# Patient Record
Sex: Male | Born: 1960 | Race: White | Hispanic: No | Marital: Married | Smoking: Never smoker
Health system: Southern US, Community
[De-identification: ages and names within clinical notes are randomized; demographics above are authoritative.]

## PROBLEM LIST (undated history)

## (undated) DIAGNOSIS — K56609 Unspecified intestinal obstruction, unspecified as to partial versus complete obstruction: Secondary | ICD-10-CM

## (undated) DIAGNOSIS — I255 Ischemic cardiomyopathy: Secondary | ICD-10-CM

## (undated) DIAGNOSIS — B356 Tinea cruris: Secondary | ICD-10-CM

## (undated) DIAGNOSIS — G629 Polyneuropathy, unspecified: Secondary | ICD-10-CM

## (undated) DIAGNOSIS — E7800 Pure hypercholesterolemia, unspecified: Secondary | ICD-10-CM

## (undated) DIAGNOSIS — C801 Malignant (primary) neoplasm, unspecified: Secondary | ICD-10-CM

## (undated) DIAGNOSIS — E559 Vitamin D deficiency, unspecified: Secondary | ICD-10-CM

## (undated) DIAGNOSIS — L219 Seborrheic dermatitis, unspecified: Secondary | ICD-10-CM

## (undated) DIAGNOSIS — D494 Neoplasm of unspecified behavior of bladder: Secondary | ICD-10-CM

## (undated) DIAGNOSIS — K5909 Other constipation: Secondary | ICD-10-CM

## (undated) DIAGNOSIS — L989 Disorder of the skin and subcutaneous tissue, unspecified: Secondary | ICD-10-CM

## (undated) DIAGNOSIS — E119 Type 2 diabetes mellitus without complications: Secondary | ICD-10-CM

## (undated) DIAGNOSIS — R5383 Other fatigue: Secondary | ICD-10-CM

## (undated) DIAGNOSIS — N529 Male erectile dysfunction, unspecified: Secondary | ICD-10-CM

## (undated) DIAGNOSIS — R0602 Shortness of breath: Secondary | ICD-10-CM

## (undated) DIAGNOSIS — I10 Essential (primary) hypertension: Secondary | ICD-10-CM

## (undated) DIAGNOSIS — L570 Actinic keratosis: Secondary | ICD-10-CM

## (undated) DIAGNOSIS — E782 Mixed hyperlipidemia: Secondary | ICD-10-CM

## (undated) DIAGNOSIS — R7989 Other specified abnormal findings of blood chemistry: Secondary | ICD-10-CM

## (undated) DIAGNOSIS — K760 Fatty (change of) liver, not elsewhere classified: Secondary | ICD-10-CM

## (undated) DIAGNOSIS — K219 Gastro-esophageal reflux disease without esophagitis: Secondary | ICD-10-CM

## (undated) DIAGNOSIS — I219 Acute myocardial infarction, unspecified: Secondary | ICD-10-CM

## (undated) DIAGNOSIS — M810 Age-related osteoporosis without current pathological fracture: Secondary | ICD-10-CM

## (undated) DIAGNOSIS — N400 Enlarged prostate without lower urinary tract symptoms: Secondary | ICD-10-CM

## (undated) DIAGNOSIS — Z6821 Body mass index (BMI) 21.0-21.9, adult: Secondary | ICD-10-CM

## (undated) DIAGNOSIS — I2510 Atherosclerotic heart disease of native coronary artery without angina pectoris: Secondary | ICD-10-CM

## (undated) DIAGNOSIS — N189 Chronic kidney disease, unspecified: Secondary | ICD-10-CM

## (undated) DIAGNOSIS — R079 Chest pain, unspecified: Secondary | ICD-10-CM

## (undated) DIAGNOSIS — R599 Enlarged lymph nodes, unspecified: Secondary | ICD-10-CM

## (undated) DIAGNOSIS — G35 Multiple sclerosis: Secondary | ICD-10-CM

## (undated) DIAGNOSIS — H6123 Impacted cerumen, bilateral: Secondary | ICD-10-CM

## (undated) DIAGNOSIS — I739 Peripheral vascular disease, unspecified: Secondary | ICD-10-CM

## (undated) DIAGNOSIS — M545 Low back pain, unspecified: Secondary | ICD-10-CM

## (undated) DIAGNOSIS — A46 Erysipelas: Secondary | ICD-10-CM

## (undated) DIAGNOSIS — J45909 Unspecified asthma, uncomplicated: Secondary | ICD-10-CM

## (undated) DIAGNOSIS — K635 Polyp of colon: Secondary | ICD-10-CM

## (undated) DIAGNOSIS — M48061 Spinal stenosis, lumbar region without neurogenic claudication: Secondary | ICD-10-CM

## (undated) DIAGNOSIS — A4902 Methicillin resistant Staphylococcus aureus infection, unspecified site: Secondary | ICD-10-CM

## (undated) DIAGNOSIS — E7889 Other lipoprotein metabolism disorders: Secondary | ICD-10-CM

## (undated) DIAGNOSIS — G8929 Other chronic pain: Secondary | ICD-10-CM

## (undated) DIAGNOSIS — M5416 Radiculopathy, lumbar region: Secondary | ICD-10-CM

## (undated) DIAGNOSIS — R634 Abnormal weight loss: Secondary | ICD-10-CM

## (undated) DIAGNOSIS — J302 Other seasonal allergic rhinitis: Secondary | ICD-10-CM

## (undated) DIAGNOSIS — B354 Tinea corporis: Secondary | ICD-10-CM

## (undated) HISTORY — DX: Impacted cerumen, bilateral: H61.23

## (undated) HISTORY — DX: Atherosclerotic heart disease of native coronary artery without angina pectoris: I25.10

## (undated) HISTORY — DX: Unspecified intestinal obstruction, unspecified as to partial versus complete obstruction: K56.609

## (undated) HISTORY — DX: Neoplasm of unspecified behavior of bladder: D49.4

## (undated) HISTORY — DX: Chest pain, unspecified: R07.9

## (undated) HISTORY — DX: Polyneuropathy, unspecified: G62.9

## (undated) HISTORY — DX: Other seasonal allergic rhinitis: J30.2

## (undated) HISTORY — DX: Fatty (change of) liver, not elsewhere classified: K76.0

## (undated) HISTORY — DX: Gastro-esophageal reflux disease without esophagitis: K21.9

## (undated) HISTORY — DX: Age-related osteoporosis without current pathological fracture: M81.0

## (undated) HISTORY — PX: MANDIBLE SURGERY: SHX707

## (undated) HISTORY — DX: Enlarged lymph nodes, unspecified: R59.9

## (undated) HISTORY — DX: Body mass index (BMI) 21.0-21.9, adult: Z68.21

## (undated) HISTORY — DX: Benign prostatic hyperplasia without lower urinary tract symptoms: N40.0

## (undated) HISTORY — DX: Erysipelas: A46

## (undated) HISTORY — DX: Peripheral vascular disease, unspecified: I73.9

## (undated) HISTORY — DX: Other constipation: K59.09

## (undated) HISTORY — DX: Ischemic cardiomyopathy: I25.5

## (undated) HISTORY — DX: Disorder of the skin and subcutaneous tissue, unspecified: L98.9

## (undated) HISTORY — DX: Shortness of breath: R06.02

## (undated) HISTORY — DX: Male erectile dysfunction, unspecified: N52.9

## (undated) HISTORY — DX: Low back pain, unspecified: M54.50

## (undated) HISTORY — DX: Other fatigue: R53.83

## (undated) HISTORY — DX: Multiple sclerosis: G35

## (undated) HISTORY — DX: Other chronic pain: G89.29

## (undated) HISTORY — DX: Spinal stenosis, lumbar region without neurogenic claudication: M48.061

## (undated) HISTORY — DX: Polyp of colon: K63.5

## (undated) HISTORY — DX: Other lipoprotein metabolism disorders: E78.89

## (undated) HISTORY — DX: Tinea corporis: B35.4

## (undated) HISTORY — DX: Unspecified asthma, uncomplicated: J45.909

## (undated) HISTORY — DX: Vitamin D deficiency, unspecified: E55.9

## (undated) HISTORY — DX: Hypomagnesemia: E83.42

## (undated) HISTORY — DX: Pure hypercholesterolemia, unspecified: E78.00

## (undated) HISTORY — DX: Mixed hyperlipidemia: E78.2

## (undated) HISTORY — DX: Abnormal weight loss: R63.4

## (undated) HISTORY — DX: Seborrheic dermatitis, unspecified: L21.9

## (undated) HISTORY — DX: Type 2 diabetes mellitus without complications: E11.9

## (undated) HISTORY — DX: Radiculopathy, lumbar region: M54.16

## (undated) HISTORY — DX: Actinic keratosis: L57.0

## (undated) HISTORY — DX: Essential (primary) hypertension: I10

## (undated) HISTORY — DX: Chronic kidney disease, unspecified: N18.9

## (undated) HISTORY — DX: Other specified abnormal findings of blood chemistry: R79.89

## (undated) HISTORY — DX: Tinea cruris: B35.6

---

## 2001-11-23 ENCOUNTER — Inpatient Hospital Stay (HOSPITAL_COMMUNITY): Admission: AD | Admit: 2001-11-23 | Discharge: 2001-12-01 | Payer: Self-pay | Admitting: Cardiovascular Disease

## 2001-11-28 ENCOUNTER — Encounter: Payer: Self-pay | Admitting: Cardiothoracic Surgery

## 2001-11-29 ENCOUNTER — Encounter: Payer: Self-pay | Admitting: Cardiothoracic Surgery

## 2001-11-30 ENCOUNTER — Encounter: Payer: Self-pay | Admitting: Thoracic Surgery (Cardiothoracic Vascular Surgery)

## 2005-03-05 ENCOUNTER — Ambulatory Visit: Payer: Self-pay | Admitting: Cardiovascular Disease

## 2005-03-09 ENCOUNTER — Ambulatory Visit: Payer: Self-pay

## 2012-11-21 ENCOUNTER — Encounter: Payer: Self-pay | Admitting: *Deleted

## 2012-11-21 ENCOUNTER — Ambulatory Visit (INDEPENDENT_AMBULATORY_CARE_PROVIDER_SITE_OTHER): Payer: Managed Care, Other (non HMO) | Admitting: Cardiovascular Disease

## 2012-11-21 VITALS — BP 120/80 | HR 67 | Ht 71.0 in | Wt 148.0 lb

## 2012-11-21 DIAGNOSIS — Z951 Presence of aortocoronary bypass graft: Secondary | ICD-10-CM

## 2012-11-21 DIAGNOSIS — I10 Essential (primary) hypertension: Secondary | ICD-10-CM

## 2012-11-21 DIAGNOSIS — I739 Peripheral vascular disease, unspecified: Secondary | ICD-10-CM

## 2012-11-21 DIAGNOSIS — R079 Chest pain, unspecified: Secondary | ICD-10-CM

## 2012-11-21 DIAGNOSIS — G35 Multiple sclerosis: Secondary | ICD-10-CM

## 2012-11-21 DIAGNOSIS — E782 Mixed hyperlipidemia: Secondary | ICD-10-CM

## 2012-11-21 NOTE — Patient Instructions (Signed)
Your physician wants you to follow-up in: 6 MONTHS WITH DR Haywood Filler will receive a reminder letter in the mail two months in advance. If you don't receive a letter, please call our office to schedule the follow-up appointment.  Your physician recommends that you continue on your current medications as directed. Please refer to the Current Medication list given to you today. Your physician has requested that you have en exercise stress myoview. For further information please visit [email or web address] Please follow instruction sheet, as given. DX CHEST PAIN  Your physician has requested that you have a lower or upper extremity arterial duplex. This test is an ultrasound of the arteries in the legs or arms. It looks at arterial blood flow in the legs and arms. Allow one hour for Lower and Upper Arterial scans. There are no restrictions or special instructions LOWER WITH ABI'S  DX CLAUDICATION

## 2012-11-21 NOTE — Progress Notes (Signed)
Patient ID: Gregory Gardner, male   DOB: Dec 17, 1960, 52 y.o.   MRN: [id number] 52 yo referred by Dr Katrinka Blazing Distant history of CABG 2003.  [name] to LAD, Sequential SVG PDA/PLA, and Sequential to D1/circumflex  Presented with IMI Given TPA in [place].  EF ok.  Poor f/u since.  Beginning of January had two episodes of SSCP, diaphoresis and dyspnea.  Was laying down at time.  No exertional pain but Diaphoresis similar to previous MI.  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  Quit smoking  Has MS and balance is better since stopping betaserone.   Compliant with meds.  Also complains of LLE calf pain with ambulation ? claudication  ROS: Denies fever, malais, weight loss, blurry vision, decreased visual acuity, cough, sputum, SOB, hemoptysis, pleuritic pain, palpitaitons, heartburn, abdominal pain, melena, lower extremity edema, claudication, or rash.  All other systems reviewed and negative   General: Affect appropriate Healthy:  appears stated age HEENT: normal Neck supple with no adenopathy JVP normal no bruits no thyromegaly Lungs clear with no wheezing and good diaphragmatic motion Heart:  S1/S2 no murmur,rub, gallop or click PMI normal Abdomen: benighn, BS positve, no tenderness, no AAA no bruit.  No HSM or HJR Distal pulses intact with no bruits No edema Neuro non-focal Skin warm and dry No muscular weakness  Medications Current Outpatient Prescriptions  Medication Sig Dispense Refill  . aspirin 325 MG tablet Take 325 mg by mouth daily.      . Choline Fenofibrate (FENOFIBRIC ACID) 135 MG CPDR Take 1 tablet by mouth daily.      . Dimethyl Fumarate (TECFIDERA) 240 MG CPDR Take by mouth.      . ezetimibe (ZETIA) 10 MG tablet Take 10 mg by mouth daily.      . metoprolol succinate (TOPROL-XL) 25 MG 24 hr tablet Take 25 mg by mouth daily.      . nitroGLYCERIN (NITROSTAT) 0.4 MG SL tablet Place 0.4 mg under the tongue every 5 (five) minutes as needed.      Marland Kitchen  omeprazole (PRILOSEC) 20 MG capsule Take 20 mg by mouth daily.        Allergies Review of patient's allergies indicates no known allergies.  Family History: No family history on file.  Social History: History   Social History  . Marital Status: Married    Spouse Name: N/A    Number of Children: N/A  . Years of Education: N/A   Occupational History  . Not on file.   Social History Main Topics  . Smoking status: Never Smoker   . Smokeless tobacco: Not on file  . Alcohol Use: Yes  . Drug Use: No  . Sexually Active: Not on file   Other Topics Concern  . Not on file   Social History Narrative  . No narrative on file    Electrocardiogram:NSR rate 67 ? [place]  Assessment and Plan

## 2012-11-21 NOTE — Assessment & Plan Note (Signed)
Cholesterol is at goal.  Continue current dose of statin and diet Rx.  No myalgias or side effects.  F/U  LFT's in 6 months. No results found for this basename: [facility]   Labs with Dr Katrinka Blazing

## 2012-11-21 NOTE — Assessment & Plan Note (Signed)
F/U neuro stable bad reaction to beta serone

## 2012-11-21 NOTE — Assessment & Plan Note (Signed)
Recurrent chest pain 11 years post CABG  F/U stress myovue

## 2012-11-21 NOTE — Assessment & Plan Note (Signed)
Well controlled.  Continue current medications and low sodium Dash type diet.    

## 2012-11-21 NOTE — Assessment & Plan Note (Signed)
Pulses seem ok with no bruit  F/U ABI's

## 2012-11-23 NOTE — Addendum Note (Signed)
Addended by: Freddi Starr on: 11/23/2012 02:18 PM   Modules accepted: Orders

## 2012-11-27 ENCOUNTER — Encounter (INDEPENDENT_AMBULATORY_CARE_PROVIDER_SITE_OTHER): Payer: Managed Care, Other (non HMO)

## 2012-11-27 DIAGNOSIS — I739 Peripheral vascular disease, unspecified: Secondary | ICD-10-CM

## 2012-11-27 DIAGNOSIS — I70219 Atherosclerosis of native arteries of extremities with intermittent claudication, unspecified extremity: Secondary | ICD-10-CM

## 2012-11-28 ENCOUNTER — Encounter: Payer: Self-pay | Admitting: Cardiovascular Disease

## 2012-12-04 ENCOUNTER — Ambulatory Visit (HOSPITAL_COMMUNITY): Payer: Managed Care, Other (non HMO) | Attending: Cardiovascular Disease | Admitting: Radiology

## 2012-12-04 VITALS — BP 108/64 | HR 56 | Ht 71.0 in | Wt 146.0 lb

## 2012-12-04 DIAGNOSIS — Z951 Presence of aortocoronary bypass graft: Secondary | ICD-10-CM

## 2012-12-04 DIAGNOSIS — I2510 Atherosclerotic heart disease of native coronary artery without angina pectoris: Secondary | ICD-10-CM

## 2012-12-04 DIAGNOSIS — I10 Essential (primary) hypertension: Secondary | ICD-10-CM | POA: Insufficient documentation

## 2012-12-04 DIAGNOSIS — R0789 Other chest pain: Secondary | ICD-10-CM | POA: Insufficient documentation

## 2012-12-04 DIAGNOSIS — R079 Chest pain, unspecified: Secondary | ICD-10-CM

## 2012-12-04 DIAGNOSIS — R0602 Shortness of breath: Secondary | ICD-10-CM | POA: Insufficient documentation

## 2012-12-04 MED ORDER — TECHNETIUM TC 99M SESTAMIBI GENERIC - CARDIOLITE
30.0000 | Freq: Once | INTRAVENOUS | Status: AC | PRN
  Administered 2012-12-04: 30 via INTRAVENOUS

## 2012-12-04 MED ORDER — TECHNETIUM TC 99M SESTAMIBI GENERIC - CARDIOLITE
10.0000 | Freq: Once | INTRAVENOUS | Status: AC | PRN
  Administered 2012-12-04: 10 via INTRAVENOUS

## 2012-12-04 NOTE — Progress Notes (Signed)
[facility] SITE 3 NUCLEAR MED [address] [place], [place] [address] [phone]    Cardiology Nuclear Med Study  Gregory Gardner is a 52 y.o. male     MRN : [id number]     DOB: 1961-05-27  Procedure Date: 12/04/2012  Nuclear Med Background Indication for Stress Test:  Evaluation for Ischemia and Graft Patency History:  '03 IWMI>CABG; '06 HQI:ONGEXB, EF=59% Cardiac Risk Factors: Family History - CAD, History of Smoking, Hypertension and Lipids  Symptoms:  Chest Pressure with and without Exertion (last episode of chest discomfort was about a month ago), Diaphoresis and SOB   Nuclear Pre-Procedure Caffeine/Decaff Intake:  None NPO After: 10:00pm   Lungs:  Clear. O2 Sat: 94% on room air. IV 0.9% NS with Angio Cath:  22g  IV Site: R Hand  IV Started by:  Cathlyn Parsons, RN  Chest Size (in):  38 Cup Size: n/a  Height: 5\' 11"  (1.803 m)  Weight:  146 lb (66.225 kg)  BMI:  Body mass index is 20.36 kg/(m^2). Tech Comments:  Toprol held x 24 hours    Nuclear Med Study 1 or 2 day study: 1 day  Stress Test Type:  Stress  Reading MD: Kristeen Miss, MD  Order Authorizing Provider:  Charlton Haws, MD  Resting Radionuclide: Technetium 13m Sestamibi  Resting Radionuclide Dose: 11.0 mCi   Stress Radionuclide:  Technetium 37m Sestamibi  Stress Radionuclide Dose: 32.9 mCi           Stress Protocol Rest HR: 56 Stress HR: 148  Rest BP: 108/64 Stress BP: 150/79  Exercise Time (min): 10:01 METS: 11.0   Predicted Max HR: 169 bpm % Max HR: 87.57 bpm Rate Pressure Product: 28413    Dose of Adenosine (mg):  n/a Dose of Lexiscan: n/a mg  Dose of Atropine (mg): n/a Dose of Dobutamine: n/a mcg/kg/min (at max HR)  Stress Test Technologist: Smiley Houseman, CMA-N  Nuclear Technologist:  Domenic Polite, CNMT     Rest Procedure:  Myocardial perfusion imaging was performed at rest 45 minutes following the intravenous administration of Technetium 41m Sestamibi.  Rest ECG: NSR -  Normal EKG  Stress Procedure:  The patient exercised on the treadmill utilizing the Bruce Protocol for 10:01 minutes. The patient stopped due to fatigue.  He c/o chest tightness, 3/10, at peak exercise; relieved immediately post exercise.  Technetium 10m Sestamibi was injected at peak exercise and myocardial perfusion imaging was performed after a brief delay.  Stress ECG: Insignificant upsloping ST segment depression.  QPS Raw Data Images:  Normal; no motion artifact; normal heart/lung ratio. Stress Images:  Normal homogeneous uptake in all areas of the myocardium. Rest Images:  Normal homogeneous uptake in all areas of the myocardium. Subtraction (SDS):  No evidence of ischemia. Transient Ischemic Dilatation (Normal <1.22):  0.96 Lung/Heart Ratio (Normal <0.45):  0.31  Quantitative Gated Spect Images QGS EDV:  74 ml QGS ESV:  27 ml  Impression Exercise Capacity:  Good exercise capacity. BP Response:  Normal blood pressure response. Clinical Symptoms:  No significant symptoms noted. ECG Impression:  Insignificant upsloping ST segment depression. Comparison with Prior Nuclear Study: No images to compare  Overall Impression:  Normal stress nuclear study.  No evidence of ischemia.  Normal LV function.  LV Ejection Fraction: 63%.  LV Wall Motion:  NL LV Function; NL Wall Motion.    Vesta Mixer, Montez Hageman., MD, [facility] 12/04/2012, 5:09 PM Office - [phone] Pager [phone]

## 2013-10-08 ENCOUNTER — Ambulatory Visit: Payer: Managed Care, Other (non HMO) | Admitting: Cardiovascular Disease

## 2013-11-09 ENCOUNTER — Encounter: Payer: Self-pay | Admitting: *Deleted

## 2013-11-09 ENCOUNTER — Encounter: Payer: Self-pay | Admitting: Cardiovascular Disease

## 2013-11-12 ENCOUNTER — Encounter: Payer: Self-pay | Admitting: Cardiovascular Disease

## 2013-11-12 ENCOUNTER — Ambulatory Visit (INDEPENDENT_AMBULATORY_CARE_PROVIDER_SITE_OTHER): Payer: Managed Care, Other (non HMO) | Admitting: Cardiovascular Disease

## 2013-11-12 VITALS — BP 122/60 | HR 56 | Ht 71.0 in | Wt 157.1 lb

## 2013-11-12 DIAGNOSIS — E782 Mixed hyperlipidemia: Secondary | ICD-10-CM

## 2013-11-12 DIAGNOSIS — Z951 Presence of aortocoronary bypass graft: Secondary | ICD-10-CM

## 2013-11-12 DIAGNOSIS — I10 Essential (primary) hypertension: Secondary | ICD-10-CM

## 2013-11-12 DIAGNOSIS — G35 Multiple sclerosis: Secondary | ICD-10-CM

## 2013-11-12 DIAGNOSIS — I739 Peripheral vascular disease, unspecified: Secondary | ICD-10-CM

## 2013-11-12 DIAGNOSIS — I2510 Atherosclerotic heart disease of native coronary artery without angina pectoris: Secondary | ICD-10-CM

## 2013-11-12 NOTE — Patient Instructions (Signed)
Your physician wants you to follow-up in: YEAR WITH DR NISHAN  You will receive a reminder letter in the mail two months in advance. If you don't receive a letter, please call our office to schedule the follow-up appointment.  Your physician recommends that you continue on your current medications as directed. Please refer to the Current Medication list given to you today. 

## 2013-11-12 NOTE — Assessment & Plan Note (Signed)
Normal ABI's  Pain seems more related to disc issue or MS  He gets lumbar injections periordically with relief  Indicating that pain in LLE is not vascular in nature

## 2013-11-12 NOTE — Assessment & Plan Note (Signed)
Stable f/u neuro  betaserone  No weakness

## 2013-11-12 NOTE — Progress Notes (Signed)
Patient ID: Gregory Gardner, male   DOB: 07/14/1961, 53 y.o.   MRN: [id number] 53 yo referred by Dr Katrinka Blazing Distant history of CABG 2003. [name] to LAD, Sequential SVG PDA/PLA, and Sequential to D1/circumflex Presented with IMI  Given TPA in [place]. EF ok. Poor f/u since. Beginning of January had two episodes of SSCP, diaphoresis and dyspnea. Was laying down at time. No exertional pain but  Diaphoresis similar to previous MI. 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 Quit smoking Has MS and balance is better since stopping betaserone.  Compliant with meds. Also complains of LLE calf pain with ambulation ? 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  All other systems reviewed and negative  General: Affect appropriate Healthy:  appears stated age HEENT: normal Neck supple with no adenopathy JVP normal no bruits no thyromegaly Lungs clear with no wheezing and good diaphragmatic motion Heart:  S1/S2 no murmur, no rub, gallop or click PMI normal Abdomen: benighn, BS positve, no tenderness, no AAA no bruit.  No HSM or HJR Distal pulses intact with no bruits No edema Neuro non-focal Skin warm and dry No muscular weakness   Current Outpatient Prescriptions  Medication Sig Dispense Refill  . Albuterol Sulfate (VENTOLIN HFA IN) Inhale into the lungs.      Marland Kitchen alendronate (FOSAMAX) 70 MG tablet Take 70 mg by mouth once a week. Take with a full glass of water on an empty stomach.      Marland Kitchen aspirin 325 MG tablet Take 325 mg by mouth daily.      . Choline Fenofibrate (FENOFIBRIC ACID) 135 MG CPDR  Take 1 tablet by mouth daily.      . Dimethyl Fumarate (TECFIDERA) 240 MG CPDR Take by mouth.      . ergocalciferol (VITAMIN D2) 50000 UNITS capsule Take 50,000 Units by mouth once a week.      . ezetimibe (ZETIA) 10 MG tablet Take 10 mg by mouth daily.      . metoprolol succinate (TOPROL-XL) 25 MG 24 hr tablet Take 25 mg by mouth daily.      . nitroGLYCERIN (NITROSTAT) 0.4 MG SL tablet Place 0.4 mg under the tongue every 5 (five) minutes as needed.      Marland Kitchen omeprazole (PRILOSEC) 20 MG capsule Take 20 mg by mouth daily.       No current facility-administered medications for this visit.    Allergies  Lodine  Electrocardiogram:  SR rate 56 PR 148  Normal   Assessment and Plan

## 2013-11-12 NOTE — Assessment & Plan Note (Signed)
Stable with no angina and good activity level.  Continue medical Rx  

## 2013-11-12 NOTE — Assessment & Plan Note (Signed)
Well controlled.  Continue current medications and low sodium Dash type diet.    

## 2013-11-12 NOTE — Assessment & Plan Note (Signed)
Cholesterol is at goal.  Continue current dose of statin and diet Rx.  No myalgias or side effects.  F/U  LFT's in 6 months. No results found for this basename: LDLCALC             

## 2013-12-03 ENCOUNTER — Telehealth: Payer: Self-pay | Admitting: *Deleted

## 2013-12-03 ENCOUNTER — Encounter: Payer: Self-pay | Admitting: *Deleted

## 2013-12-03 NOTE — Telephone Encounter (Signed)
LAST OFFICE NOTE  AND CLEARANCE  LETTER  FAXED  .Gregory Gardner

## 2013-12-03 NOTE — Telephone Encounter (Signed)
DR  CABERWAL   RADIOLOGISTS FROM  [facility]  CALLED  PT  NEEDING  PROCEDURE  DID  NOT  UNDERSTAND  WHAT  TYPE   DR  CABERWAL WOULD LIKE   CALL HERE  IS  CELL  NUMBER  [phone].

## 2013-12-03 NOTE — Telephone Encounter (Signed)
Ok to have TURP Talked with Caberwal.  Fax clarence note and my last office visit note to   [phone]

## 2015-01-13 ENCOUNTER — Ambulatory Visit (INDEPENDENT_AMBULATORY_CARE_PROVIDER_SITE_OTHER): Payer: Managed Care, Other (non HMO) | Admitting: Cardiovascular Disease

## 2015-01-13 ENCOUNTER — Encounter: Payer: Self-pay | Admitting: Cardiovascular Disease

## 2015-01-13 DIAGNOSIS — Z0000 Encounter for general adult medical examination without abnormal findings: Secondary | ICD-10-CM

## 2015-01-13 DIAGNOSIS — I10 Essential (primary) hypertension: Secondary | ICD-10-CM

## 2015-01-13 NOTE — Assessment & Plan Note (Signed)
Cholesterol is at goal.  Continue current dose of statin and diet Rx.  No myalgias or side effects.  F/U  LFT's in 6 months. No results found for: LDLCALC Labs with primary           

## 2015-01-13 NOTE — Assessment & Plan Note (Signed)
Stable with no angina and good activity level.  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

## 2015-01-13 NOTE — Assessment & Plan Note (Signed)
Well controlled.  Continue current medications and low sodium Dash type diet.    

## 2015-01-13 NOTE — Progress Notes (Signed)
Patient ID: Gregory Gardner, male   DOB: 05/25/1961, 54 y.o.   MRN: [id number] 54 y.o.  referred by Dr Gregory Gardner Distant history of CABG 2003. Gregory Gardner LIMA to LAD, Sequential SVG PDA/PLA, and Sequential to D1/circumflex Presented with IMI  Given TPA in [place]. EF ok.. 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 Quit smoking Has MS and balance is better since stopping betaserone.  Compliant with meds. Also complains of LLE calf pain with ambulation ? 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  All other systems reviewed and negative  General: Affect appropriate Healthy:  appears stated age HEENT: normal Neck supple with no adenopathy JVP normal no bruits no thyromegaly Lungs clear with no wheezing and good diaphragmatic motion Heart:  S1/S2 no murmur, no rub, gallop or click PMI normal Abdomen: benighn, BS positve, no tenderness, no AAA no bruit.  No HSM or HJR Distal pulses intact with no bruits No edema Neuro non-focal Skin warm and dry No muscular weakness   Current Outpatient Prescriptions  Medication Sig Dispense Refill  . Albuterol Sulfate (VENTOLIN HFA IN) Inhale into the lungs.    Marland Kitchen alendronate (FOSAMAX) 70 MG tablet Take 70 mg by mouth once a week. Take with a full glass of water on an empty stomach.    Marland Kitchen aspirin 325 MG tablet Take 325 mg by mouth daily.    . Choline Fenofibrate (FENOFIBRIC ACID) 135 MG CPDR Take 1 tablet by mouth daily.    . Dimethyl Fumarate (TECFIDERA) 240 MG  CPDR Take by mouth.    . ergocalciferol (VITAMIN D2) 50000 UNITS capsule Take 50,000 Units by mouth once a week.    . ezetimibe (ZETIA) 10 MG tablet Take 10 mg by mouth daily.    . metoprolol succinate (TOPROL-XL) 25 MG 24 hr tablet Take 25 mg by mouth daily.    . nitroGLYCERIN (NITROSTAT) 0.4 MG SL tablet Place 0.4 mg under the tongue every 5 (five) minutes as needed.    Marland Kitchen omeprazole (PRILOSEC) 20 MG capsule Take 20 mg by mouth daily.     No current facility-administered medications for this visit.    Allergies  Lodine  Electrocardiogram:  11/12/13  SR rate 56 PR 148  Normal   01/13/15  SR rate 63 normal no change   Assessment and Plan

## 2015-01-13 NOTE — Patient Instructions (Addendum)
Your physician wants you to follow-up in: YEAR  WITH DR Haywood Filler will receive a reminder letter in the mail two months in advance. If you don't receive a letter, please call our office to schedule the follow-up appointment. Your physician recommends that you continue on your current medications as directed. Please refer to the Current Medication list given to you today.   Your physician has requested that you have an echocardiogram. Echocardiography is a painless test that uses sound waves to create images of your heart. It provides your doctor with information about the size and shape of your heart and how well your heart's chambers and valves are working. This procedure takes approximately one hour. There are no restrictions for this procedure. Monday'S   ARE  BETTER  DAY

## 2015-01-20 ENCOUNTER — Ambulatory Visit (HOSPITAL_COMMUNITY): Payer: Managed Care, Other (non HMO) | Attending: Cardiology

## 2015-01-20 ENCOUNTER — Telehealth: Payer: Self-pay | Admitting: Cardiovascular Disease

## 2015-01-20 DIAGNOSIS — Z0000 Encounter for general adult medical examination without abnormal findings: Secondary | ICD-10-CM | POA: Insufficient documentation

## 2015-01-20 NOTE — Progress Notes (Signed)
2D Echo completed. 01/20/2015 

## 2015-01-20 NOTE — Telephone Encounter (Signed)
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   Pt may be called if there are any questions.

## 2015-01-21 NOTE — Telephone Encounter (Signed)
PT  NOTIFIED  ECHO  AND LAST  OFFICE NOTE  SENT  TO URGENT  CARE   FAX  NUMBER  [phone]

## 2016-02-18 NOTE — Progress Notes (Signed)
Patient ID: BAILOR DARGIN, male   DOB: [birth year], 55 y.o.   MRN: [id number]   55 y.o.  referred by Dr Katrinka Blazing Distant history of CABG 2003. [name] to LAD, Sequential SVG PDA/PLA, and Sequential to D1/circumflex Presented with IMI  Given TPA in [place]. EF ok.. 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 Quit smoking Has MS and balance is better since stopping betaserone.  Compliant with meds. Also complains of LLE calf pain with ambulation ? 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 Systolic function was normal. The estimated ejection fraction was in the range of 55% to 60%. Wall motion was normal; there were no regional wall motion abnormalities. - Aortic valve: Mild thickening and calcification, consistent with sclerosis. - Tricuspid valve: There was mild regurgitation. - Pulmonic valve: There was trivial regurgitation.  MS is stable Cannot take statin due to LFT elevations   ROS: Denies fever, malais, weight loss, blurry vision, decreased visual acuity, cough, sputum, SOB, hemoptysis, pleuritic pain, palpitaitons, heartburn, abdominal pain, melena, lower extremity edema, claudication, or rash.  All other systems reviewed and negative  General: Affect appropriate Healthy:  appears stated age HEENT: normal Neck supple with no adenopathy JVP normal no bruits no thyromegaly Lungs clear with no wheezing and good diaphragmatic motion Heart:  S1/S2 no murmur, no rub, gallop or click PMI normal Abdomen: benighn, BS positve, no tenderness, no AAA no bruit.  No HSM or HJR Distal pulses intact with no bruits No edema Neuro non-focal Skin warm and dry No muscular weakness   Current Outpatient  Prescriptions  Medication Sig Dispense Refill  . Albuterol Sulfate (VENTOLIN HFA IN) Inhale 1 puff into the lungs as needed (for wheezing).     Marland Kitchen alendronate (FOSAMAX) 70 MG tablet Take 70 mg by mouth once a week. Take with a full glass of water on an empty stomach.    Marland Kitchen aspirin 81 MG tablet Take 81 mg by mouth daily.    . cholecalciferol (VITAMIN D) 1000 UNITS tablet Take 1,000 Units by mouth daily.    . Choline Fenofibrate (FENOFIBRIC ACID) 135 MG CPDR Take 1 tablet by mouth daily.    . Dimethyl Fumarate (TECFIDERA) 240 MG CPDR Take 120 mg by mouth daily.     Marland Kitchen ezetimibe (ZETIA) 10 MG tablet Take 10 mg by mouth daily.    . metoprolol succinate (TOPROL-XL) 25 MG 24 hr tablet Take 25 mg by mouth daily.    . nitroGLYCERIN (NITROSTAT) 0.4 MG SL tablet Place 0.4 mg under the tongue every 5 (five) minutes as needed.    Marland Kitchen omeprazole (PRILOSEC) 40 MG capsule Take 40 mg by mouth daily.     No current facility-administered medications for this visit.    Allergies  Lodine  Electrocardiogram:  11/12/13  SR rate 56 PR 148  Normal   01/13/15  SR rate 63 normal no change  02/19/16 SR rate 69 normal   Assessment and Plan CAD/CABG:  Distant 2003 normal myovue 2014 normal EF by echo 2016 Chol:  On zetia and fibrates followed by primary  Intolerant to statins due to liver abnormalities  GERD:  On prilosec low carb diet  MS stable manifests as imbalance continue tecfidera f/u neuro   Charlton Haws

## 2016-02-19 ENCOUNTER — Encounter: Payer: Self-pay | Admitting: Cardiovascular Disease

## 2016-02-19 ENCOUNTER — Ambulatory Visit (INDEPENDENT_AMBULATORY_CARE_PROVIDER_SITE_OTHER): Payer: Managed Care, Other (non HMO) | Admitting: Cardiovascular Disease

## 2016-02-19 VITALS — BP 126/78 | HR 70 | Ht 71.0 in | Wt 168.0 lb

## 2016-02-19 DIAGNOSIS — Z951 Presence of aortocoronary bypass graft: Secondary | ICD-10-CM | POA: Diagnosis not present

## 2016-02-19 DIAGNOSIS — I10 Essential (primary) hypertension: Secondary | ICD-10-CM

## 2016-02-19 NOTE — Addendum Note (Signed)
Addended by: Arcola Jansky on: 02/19/2016 11:18 AM   Modules accepted: Medications

## 2016-02-19 NOTE — Patient Instructions (Signed)

## 2018-10-16 NOTE — Progress Notes (Signed)
Patient ID: Gregory Gardner, male   DOB: [birth year], 57 y.o.   MRN: [id number]     57 y.o.  distant history of CABG 2003. 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 Quit smoking Has MS and balance is better since stopping betaserone.  Compliant with meds.   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  All other systems reviewed and negative  General: BP 124/82   Pulse (!) 51   Ht 5\' 11"  (1.803 m)   Wt 166 lb 12.8 oz (75.7 kg)   SpO2 100%   BMI 23.26 kg/m  Affect appropriate Healthy:  appears stated age HEENT: normal Neck supple with no adenopathy JVP normal no bruits no thyromegaly Lungs clear with no wheezing and good diaphragmatic motion Heart:  S1/S2 no murmur, no rub, gallop or click PMI normal Abdomen: benighn, BS positve, no tenderness, no AAA no bruit.  No HSM or HJR Distal pulses intact with no bruits No edema Neuro non-focal Skin warm and dry No muscular weakness    Current Outpatient Medications  Medication Sig Dispense Refill  . Albuterol Sulfate (VENTOLIN HFA IN) Inhale 1 puff into the lungs as needed (for wheezing).     Marland Kitchen alendronate (FOSAMAX) 70 MG tablet Take 70 mg by mouth once a week. Take with a full glass of water on an empty stomach.    Marland Kitchen aspirin 81 MG tablet Take 81 mg by mouth daily.    . cholecalciferol (VITAMIN D) 1000 UNITS tablet Take 1,000 Units by mouth daily.    . Choline Fenofibrate (FENOFIBRIC ACID) 135 MG CPDR Take 1 tablet by mouth daily.      Marland Kitchen ezetimibe (ZETIA) 10 MG tablet Take 10 mg by mouth daily.    . metoprolol succinate (TOPROL-XL) 25 MG 24 hr tablet Take 25 mg by mouth daily.    . nitroGLYCERIN (NITROSTAT) 0.4 MG SL tablet Place 0.4 mg under the tongue every 5 (five) minutes as needed for chest pain.     Marland Kitchen ocrelizumab 300 mg in sodium chloride 0.9 % 250 mL Inject 300 mg into the vein every 6 (six) months.    Marland Kitchen omeprazole (PRILOSEC) 40 MG capsule Take 40 mg by mouth daily.     No current facility-administered medications for this visit.     Allergies  Lodine [etodolac]  Electrocardiogram:  10/18/18  SR rate 51 normal   Assessment and Plan  CAD/CABG:  Distant 2003 normal myovue 2014 normal EF by echo 2016 f/u exercise Stress myovue for DOT  Chol:  On zetia and fibrates followed by primary  Intolerant to statins due to liver abnormalities   GERD:  On prilosec low carb diet   MS stable manifests as imbalance continue tecfidera f/u neuro   Charlton Haws

## 2018-10-18 ENCOUNTER — Encounter (INDEPENDENT_AMBULATORY_CARE_PROVIDER_SITE_OTHER): Payer: Self-pay

## 2018-10-18 ENCOUNTER — Encounter: Payer: Self-pay | Admitting: Cardiovascular Disease

## 2018-10-18 ENCOUNTER — Ambulatory Visit: Payer: Managed Care, Other (non HMO) | Admitting: Cardiovascular Disease

## 2018-10-18 VITALS — BP 124/82 | HR 51 | Ht 71.0 in | Wt 166.8 lb

## 2018-10-18 DIAGNOSIS — I252 Old myocardial infarction: Secondary | ICD-10-CM

## 2018-10-18 DIAGNOSIS — I2510 Atherosclerotic heart disease of native coronary artery without angina pectoris: Secondary | ICD-10-CM | POA: Diagnosis not present

## 2018-10-18 NOTE — Addendum Note (Signed)
Addended by: Virl Axe, Nicholai Willette L on: 10/18/2018 09:36 AM   Modules accepted: Orders

## 2018-10-18 NOTE — Patient Instructions (Signed)
Medication Instructions:   If you need a refill on your cardiac medications before your next appointment, please call your pharmacy.   Lab work:  If you have labs (blood work) drawn today and your tests are completely normal, you will receive your results only by: Marland Kitchen MyChart Message (if you have MyChart) OR . A paper copy in the mail If you have any lab test that is abnormal or we need to change your treatment, we will call you to review the results.  Testing/Procedures: Your physician has requested that you have en exercise stress myoview. For further information please visit [email or web address] Please follow instruction sheet, as given.  Follow-Up: At [facility] you and your health needs are our priority.  As part of our continuing mission to provide you with exceptional heart care, we have created designated Provider Care Teams.  These Care Teams include your primary Cardiologist (physician) and Advanced Practice Providers (APPs -  Physician Assistants and Nurse Practitioners) who all work together to provide you with the care you need, when you need it. You will need a follow up appointment in 1 years.  Please call our office 2 months in advance to schedule this appointment.  You may see Charlton Haws, MD or one of the following Advanced Practice Providers on your designated Care Team:   Norma Fredrickson, NP Nada Boozer, NP . Georgie Chard, NP

## 2018-11-06 ENCOUNTER — Telehealth (HOSPITAL_COMMUNITY): Payer: Self-pay

## 2018-11-06 NOTE — Telephone Encounter (Signed)
Detailed message left for the patient. Asked to call back with any questions. S.Itamar Mcgowan EMTP 

## 2018-11-09 ENCOUNTER — Ambulatory Visit (HOSPITAL_COMMUNITY): Payer: Managed Care, Other (non HMO) | Attending: Cardiovascular Disease

## 2018-11-09 DIAGNOSIS — I2510 Atherosclerotic heart disease of native coronary artery without angina pectoris: Secondary | ICD-10-CM | POA: Insufficient documentation

## 2018-11-09 DIAGNOSIS — I252 Old myocardial infarction: Secondary | ICD-10-CM | POA: Diagnosis not present

## 2018-11-09 LAB — MYOCARDIAL PERFUSION IMAGING
CHL CUP NUCLEAR SDS: 4
Estimated workload: 8.9 METS
Exercise duration (min): 7 min
Exercise duration (sec): 15 s
LV dias vol: 54 mL (ref 62–150)
LV sys vol: 15 mL
MPHR: 163 {beats}/min
Peak HR: 146 {beats}/min
Percent HR: 89 %
Rest HR: 74 {beats}/min
SRS: 0
SSS: 4
TID: 0.84

## 2018-11-09 MED ORDER — TECHNETIUM TC 99M TETROFOSMIN IV KIT
10.7000 | PACK | Freq: Once | INTRAVENOUS | Status: AC | PRN
  Administered 2018-11-09: 10.7 via INTRAVENOUS
  Filled 2018-11-09: qty 11

## 2018-11-09 MED ORDER — TECHNETIUM TC 99M TETROFOSMIN IV KIT
32.8000 | PACK | Freq: Once | INTRAVENOUS | Status: AC | PRN
  Administered 2018-11-09: 32.8 via INTRAVENOUS
  Filled 2018-11-09: qty 33

## 2020-01-07 NOTE — Progress Notes (Signed)
Virtual Visit via Telephone Note   This visit type was conducted due to national recommendations for restrictions regarding the COVID-19 Pandemic (e.g. social distancing) in an effort to limit this patient's exposure and mitigate transmission in our community.  Due to his co-morbid illnesses, this patient is at least at moderate risk for complications without adequate follow up.  This format is felt to be most appropriate for this patient at this time.  The patient did not have access to video technology/had technical difficulties with video requiring transitioning to audio format only (telephone).  All issues noted in this document were discussed and addressed.  No physical exam could be performed with this format.  Please refer to the patient's chart for his  consent to telehealth for [facility].   The patient was identified using 2 identifiers.  Date:  01/08/2020   ID:  Gregory Gardner, DOB 07-Apr-1961, MRN [id number]  Patient Location: Other:  work Provider Location: Office  PCP:  Caffie Damme, MD  Cardiologist:  Charlton Haws, MD  Electrophysiologist:  None   Evaluation Performed:  Follow-Up Visit  Chief Complaint:  CAD  History of Present Illness:    Gregory Gardner is a 59 y.o. male with distant history of CABG 2003. 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 Quit smoking Has MS and balance is better since stopping betaserone.  Compliant with meds.   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   Today 01/08/20 pt has no complaints of chest pain or SOB.  He has not have COVID in the last year.  He does not believe he  will obtain Vaccine, he becomes ill with flu vaccine.  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  Plan for stress test next year.   He does not exercise.  Lipids were just evaluated by PCP will attempt to obtain copy of these.  He was unable to take BP but on recent appt his BP was good.   He does try to eat healthy.      The patient does not have symptoms concerning for COVID-19 infection (fever, chills, cough, or new shortness of breath).    Past Medical History:  Diagnosis Date  . Chest pain   . Claudication (HCC)   . Mixed hyperlipidemia   . Multiple sclerosis (HCC)    Past Surgical History:  Procedure Laterality Date  . MANDIBLE SURGERY       Current Meds  Medication Sig  . Albuterol Sulfate (VENTOLIN HFA IN) Inhale 1 puff into the lungs as needed (for wheezing).   Marland Kitchen alendronate (FOSAMAX) 70 MG tablet Take 70 mg by mouth once a week. Take with a full glass of water on an empty stomach.  . cholecalciferol (VITAMIN D) 1000 UNITS tablet Take 1,000 Units by mouth daily.  . Choline Fenofibrate (FENOFIBRIC ACID) 135 MG CPDR Take 1 tablet by mouth daily.  . DULoxetine (CYMBALTA) 30 MG capsule Take 1 capsule by mouth daily.  Marland Kitchen ezetimibe (ZETIA) 10 MG tablet Take 10 mg by mouth daily.  . metoprolol succinate (TOPROL-XL) 25 MG 24 hr tablet Take  25 mg by mouth daily.  . nitroGLYCERIN (NITROSTAT) 0.4 MG SL tablet Place 0.4 mg under the tongue every 5 (five) minutes as needed for chest pain.   Marland Kitchen ocrelizumab 300 mg in sodium chloride 0.9 % 250 mL Inject 300 mg into the vein every 6 (six) months.  Marland Kitchen omeprazole (PRILOSEC) 40 MG capsule Take 40 mg by mouth daily.     Allergies:   Lodine [etodolac]   Social History   Tobacco Use  . Smoking status: Never Smoker  . Smokeless tobacco: Never Used  Substance Use Topics  . Alcohol use: Yes  . Drug use: No     Family Hx: The patient's family history includes Heart Problems in his father.  All of father's family died with heart  disease  ROS:   Please see the history of present illness.    General:no colds or fevers, no weight changes Skin:no rashes or ulcers HEENT:no blurred vision, no congestion CV:see HPI PUL:see HPI GI:no diarrhea constipation or melena, no indigestion GU:no hematuria, no dysuria MS:no joint pain, no claudication Neuro:no syncope, no lightheadedness Endo:no diabetes, no thyroid disease  All other systems reviewed and are negative.   Prior CV studies:   The following studies were reviewed today:  11/09/18 nuc study  Study Highlights   Nuclear stress EF: 72%. The left ventricular ejection fraction is hyperdynamic (>65%).  There was no ST segment deviation noted during stress.  The patient walked for 7 minutes and 15 seconds. He achieved a peak heart rate of 146 which is 89% predicted maximal heart rate.  The patient developed very mild ST segment depression in the anterior lateral leads which resolved very quickly during the recovery phase.. The blood pressure response to exercise was normal.  The study is normal.  This is a low risk study.   Echo 01/20/15 Study Conclusions   - Left ventricle: The cavity size was normal. Systolic function was  normal. The estimated ejection fraction was in the range of 55%  to 60%. Wall motion was normal; there were no regional wall  motion abnormalities.  - Aortic valve: Mild thickening and calcification, consistent with  sclerosis.  - Tricuspid valve: There was mild regurgitation.  - Pulmonic valve: There was trivial regurgitation.     Labs/Other Tests and Data Reviewed:    EKG:  An ECG dated 10/18/18 was personally reviewed today and demonstrated:  SB at 51 otherwise normal EKG  Recent Labs: No results found for requested labs within last 8760 hours.   Recent Lipid Panel No results found for: CHOL, TRIG, HDL, CHOLHDL, LDLCALC, LDLDIRECT  Wt Readings from Last 3 Encounters:  11/09/18 166 lb (75.3 kg)  10/18/18 166 lb  12.8 oz (75.7 kg)  02/19/16 168 lb (76.2 kg)     Objective:    Vital Signs:  Ht 5\' 11"  (1.803 m)   BMI 23.15 kg/m    VITAL SIGNS:  reviewed  Pt unable to take BP, with PCP it was normal General NAD Pulmonary can speak in complete sentences without SOB Neuro A&O X 3 answers questions approp.  ASSESSMENT & PLAN:    1. CAD with CABG in 2003.  All nuc studies have been normal since that time.   He will need repat nuc in 1 year unless DOT requires earlier.   Will have him see Dr. 2004 in 1 year.   2. HLD  Will obtain form PCP, recently done- intolerant to statins.  3. MS stable followed by Neuro   COVID-19  Education: The signs and symptoms of COVID-19 were discussed with the patient and how to seek care for testing (follow up with PCP or arrange E-visit).  The importance of social distancing was discussed today.  Time:   Today, I have spent 8 minutes with the patient with telehealth technology discussing the above problems.     Medication Adjustments/Labs and Tests Ordered: Current medicines are reviewed at length with the patient today.  Concerns regarding medicines are outlined above.   Tests Ordered: No orders of the defined types were placed in this encounter.   Medication Changes: No orders of the defined types were placed in this encounter.   Follow Up:  In Person in 1 year(s)  Signed, Cecilie Kicks, NP  01/08/2020 8:35 AM    [place]

## 2020-01-08 ENCOUNTER — Encounter: Payer: Self-pay | Admitting: Cardiology

## 2020-01-08 ENCOUNTER — Telehealth (INDEPENDENT_AMBULATORY_CARE_PROVIDER_SITE_OTHER): Payer: Managed Care, Other (non HMO) | Admitting: Cardiology

## 2020-01-08 ENCOUNTER — Other Ambulatory Visit: Payer: Self-pay

## 2020-01-08 VITALS — Ht 71.0 in

## 2020-01-08 DIAGNOSIS — I2510 Atherosclerotic heart disease of native coronary artery without angina pectoris: Secondary | ICD-10-CM

## 2020-01-08 DIAGNOSIS — Z951 Presence of aortocoronary bypass graft: Secondary | ICD-10-CM

## 2020-01-08 DIAGNOSIS — E782 Mixed hyperlipidemia: Secondary | ICD-10-CM

## 2020-01-08 NOTE — Patient Instructions (Signed)
Medication Instructions:  Your physician recommends that you continue on your current medications as directed. Please refer to the Current Medication list given to you today.  *If you need a refill on your cardiac medications before your next appointment, please call your pharmacy*   Lab Work: None ordered   If you have labs (blood work) drawn today and your tests are completely normal, you will receive your results only by: Marland Kitchen MyChart Message (if you have MyChart) OR . A paper copy in the mail If you have any lab test that is abnormal or we need to change your treatment, we will call you to review the results.   Testing/Procedures: None    Follow-Up: At [facility], you and your health needs are our priority.  As part of our continuing mission to provide you with exceptional heart care, we have created designated Provider Care Teams.  These Care Teams include your primary Cardiologist (physician) and Advanced Practice Providers (APPs -  Physician Assistants and Nurse Practitioners) who all work together to provide you with the care you need, when you need it.  We recommend signing up for the patient portal called "MyChart".  Sign up information is provided on this After Visit Summary.  MyChart is used to connect with patients for Virtual Visits (Telemedicine).  Patients are able to view lab/test results, encounter notes, upcoming appointments, etc.  Non-urgent messages can be sent to your provider as well.   To learn more about what you can do with MyChart, go to [email or web address].    Your next appointment:   12 month(s)  The format for your next appointment:   In Person  Provider:   You may see Charlton Haws, MD or one of the following Advanced Practice Providers on your designated Care Team:    Norma Fredrickson, NP  Nada Boozer, NP  Georgie Chard, NP    Other Instructions None

## 2020-05-21 ENCOUNTER — Other Ambulatory Visit: Payer: Self-pay | Admitting: Physician Assistant

## 2020-05-21 DIAGNOSIS — M5432 Sciatica, left side: Secondary | ICD-10-CM

## 2020-05-21 DIAGNOSIS — Z77018 Contact with and (suspected) exposure to other hazardous metals: Secondary | ICD-10-CM

## 2020-06-16 ENCOUNTER — Encounter (HOSPITAL_COMMUNITY)
Admission: EM | Disposition: A | Discharge status: Home / Self Care | Payer: Managed Care, Other (non HMO) | Source: Other Acute Inpatient Hospital | Attending: Cardiothoracic Surgery

## 2020-06-16 ENCOUNTER — Inpatient Hospital Stay (HOSPITAL_COMMUNITY)
Admission: EM | Admit: 2020-06-16 | Discharge: 2020-06-24 | DRG: 234 | Disposition: A | Discharge status: Home / Self Care | Payer: Managed Care, Other (non HMO) | Source: Other Acute Inpatient Hospital | Attending: Cardiothoracic Surgery | Admitting: Cardiothoracic Surgery

## 2020-06-16 DIAGNOSIS — Z9689 Presence of other specified functional implants: Secondary | ICD-10-CM

## 2020-06-16 DIAGNOSIS — Z20822 Contact with and (suspected) exposure to covid-19: Secondary | ICD-10-CM | POA: Diagnosis present

## 2020-06-16 DIAGNOSIS — Z09 Encounter for follow-up examination after completed treatment for conditions other than malignant neoplasm: Secondary | ICD-10-CM

## 2020-06-16 DIAGNOSIS — I214 Non-ST elevation (NSTEMI) myocardial infarction: Secondary | ICD-10-CM | POA: Diagnosis present

## 2020-06-16 DIAGNOSIS — Z7983 Long term (current) use of bisphosphonates: Secondary | ICD-10-CM

## 2020-06-16 DIAGNOSIS — D696 Thrombocytopenia, unspecified: Secondary | ICD-10-CM | POA: Diagnosis not present

## 2020-06-16 DIAGNOSIS — E1151 Type 2 diabetes mellitus with diabetic peripheral angiopathy without gangrene: Secondary | ICD-10-CM | POA: Diagnosis present

## 2020-06-16 DIAGNOSIS — Z888 Allergy status to other drugs, medicaments and biological substances status: Secondary | ICD-10-CM

## 2020-06-16 DIAGNOSIS — D62 Acute posthemorrhagic anemia: Secondary | ICD-10-CM | POA: Diagnosis not present

## 2020-06-16 DIAGNOSIS — G35 Multiple sclerosis: Secondary | ICD-10-CM | POA: Diagnosis present

## 2020-06-16 DIAGNOSIS — I2510 Atherosclerotic heart disease of native coronary artery without angina pectoris: Secondary | ICD-10-CM

## 2020-06-16 DIAGNOSIS — Z79899 Other long term (current) drug therapy: Secondary | ICD-10-CM | POA: Diagnosis not present

## 2020-06-16 DIAGNOSIS — E1165 Type 2 diabetes mellitus with hyperglycemia: Secondary | ICD-10-CM | POA: Diagnosis not present

## 2020-06-16 DIAGNOSIS — E782 Mixed hyperlipidemia: Secondary | ICD-10-CM | POA: Diagnosis present

## 2020-06-16 DIAGNOSIS — K760 Fatty (change of) liver, not elsewhere classified: Secondary | ICD-10-CM | POA: Diagnosis present

## 2020-06-16 DIAGNOSIS — E876 Hypokalemia: Secondary | ICD-10-CM | POA: Diagnosis not present

## 2020-06-16 DIAGNOSIS — I249 Acute ischemic heart disease, unspecified: Secondary | ICD-10-CM | POA: Diagnosis not present

## 2020-06-16 DIAGNOSIS — I081 Rheumatic disorders of both mitral and tricuspid valves: Secondary | ICD-10-CM | POA: Diagnosis present

## 2020-06-16 DIAGNOSIS — I10 Essential (primary) hypertension: Secondary | ICD-10-CM | POA: Diagnosis present

## 2020-06-16 DIAGNOSIS — Z7982 Long term (current) use of aspirin: Secondary | ICD-10-CM

## 2020-06-16 DIAGNOSIS — J9811 Atelectasis: Secondary | ICD-10-CM | POA: Diagnosis not present

## 2020-06-16 DIAGNOSIS — R609 Edema, unspecified: Secondary | ICD-10-CM | POA: Diagnosis not present

## 2020-06-16 DIAGNOSIS — J45909 Unspecified asthma, uncomplicated: Secondary | ICD-10-CM | POA: Diagnosis present

## 2020-06-16 DIAGNOSIS — I25719 Atherosclerosis of autologous vein coronary artery bypass graft(s) with unspecified angina pectoris: Secondary | ICD-10-CM | POA: Diagnosis present

## 2020-06-16 DIAGNOSIS — G2581 Restless legs syndrome: Secondary | ICD-10-CM | POA: Diagnosis present

## 2020-06-16 DIAGNOSIS — Z8249 Family history of ischemic heart disease and other diseases of the circulatory system: Secondary | ICD-10-CM | POA: Diagnosis not present

## 2020-06-16 DIAGNOSIS — I252 Old myocardial infarction: Secondary | ICD-10-CM | POA: Diagnosis not present

## 2020-06-16 DIAGNOSIS — Z87891 Personal history of nicotine dependence: Secondary | ICD-10-CM | POA: Diagnosis not present

## 2020-06-16 DIAGNOSIS — I25110 Atherosclerotic heart disease of native coronary artery with unstable angina pectoris: Secondary | ICD-10-CM | POA: Diagnosis not present

## 2020-06-16 DIAGNOSIS — I739 Peripheral vascular disease, unspecified: Secondary | ICD-10-CM | POA: Diagnosis present

## 2020-06-16 DIAGNOSIS — Z951 Presence of aortocoronary bypass graft: Secondary | ICD-10-CM

## 2020-06-16 DIAGNOSIS — Z4682 Encounter for fitting and adjustment of non-vascular catheter: Secondary | ICD-10-CM

## 2020-06-16 DIAGNOSIS — I25119 Atherosclerotic heart disease of native coronary artery with unspecified angina pectoris: Secondary | ICD-10-CM | POA: Diagnosis present

## 2020-06-16 DIAGNOSIS — E785 Hyperlipidemia, unspecified: Secondary | ICD-10-CM | POA: Diagnosis not present

## 2020-06-16 DIAGNOSIS — Z01810 Encounter for preprocedural cardiovascular examination: Secondary | ICD-10-CM | POA: Diagnosis not present

## 2020-06-16 DIAGNOSIS — Z01811 Encounter for preprocedural respiratory examination: Secondary | ICD-10-CM

## 2020-06-16 HISTORY — DX: Methicillin resistant Staphylococcus aureus infection, unspecified site: A49.02

## 2020-06-16 HISTORY — PX: LEFT HEART CATH AND CORS/GRAFTS ANGIOGRAPHY: CATH118250

## 2020-06-16 LAB — POCT ACTIVATED CLOTTING TIME: Activated Clotting Time: 131 seconds

## 2020-06-16 SURGERY — LEFT HEART CATH AND CORS/GRAFTS ANGIOGRAPHY
Anesthesia: LOCAL

## 2020-06-16 MED ORDER — HEPARIN (PORCINE) IN NACL 1000-0.9 UT/500ML-% IV SOLN
INTRAVENOUS | Status: AC
  Filled 2020-06-16: qty 1000

## 2020-06-16 MED ORDER — FENOFIBRIC ACID 135 MG PO CPDR: 1.0000 | DELAYED_RELEASE_CAPSULE | Freq: Every day | ORAL | Status: DC

## 2020-06-16 MED ORDER — FENTANYL CITRATE (PF) 100 MCG/2ML IJ SOLN
INTRAMUSCULAR | Status: DC | PRN
  Administered 2020-06-16 (×2): 25 ug via INTRAVENOUS

## 2020-06-16 MED ORDER — ONDANSETRON HCL 4 MG/2ML IJ SOLN
4.0000 mg | Freq: Four times a day (QID) | INTRAMUSCULAR | Status: DC | PRN
  Administered 2020-06-16: 4 mg via INTRAVENOUS

## 2020-06-16 MED ORDER — LIDOCAINE HCL (PF) 1 % IJ SOLN
INTRAMUSCULAR | Status: AC
  Filled 2020-06-16: qty 30

## 2020-06-16 MED ORDER — FENTANYL CITRATE (PF) 100 MCG/2ML IJ SOLN
INTRAMUSCULAR | Status: AC
  Filled 2020-06-16: qty 2

## 2020-06-16 MED ORDER — ACETAMINOPHEN 325 MG PO TABS
650.0000 mg | ORAL_TABLET | ORAL | Status: DC | PRN
  Administered 2020-06-17 (×3): 650 mg via ORAL
  Filled 2020-06-16 (×3): qty 2

## 2020-06-16 MED ORDER — EZETIMIBE 10 MG PO TABS
10.0000 mg | ORAL_TABLET | Freq: Every day | ORAL | Status: DC
  Administered 2020-06-17: 10 mg via ORAL
  Filled 2020-06-16: qty 1

## 2020-06-16 MED ORDER — HYDRALAZINE HCL 20 MG/ML IJ SOLN: 10.0000 mg | INTRAMUSCULAR | Status: AC | PRN

## 2020-06-16 MED ORDER — HEPARIN (PORCINE) 25000 UT/250ML-% IV SOLN
1250.0000 [IU]/h | INTRAVENOUS | Status: DC
  Administered 2020-06-17: 950 [IU]/h via INTRAVENOUS
  Filled 2020-06-16: qty 250

## 2020-06-16 MED ORDER — LIDOCAINE HCL (PF) 1 % IJ SOLN
INTRAMUSCULAR | Status: DC | PRN
  Administered 2020-06-16: 10 mL

## 2020-06-16 MED ORDER — ALBUTEROL SULFATE (2.5 MG/3ML) 0.083% IN NEBU: 2.5000 mg | INHALATION_SOLUTION | Freq: Four times a day (QID) | RESPIRATORY_TRACT | Status: DC | PRN

## 2020-06-16 MED ORDER — HEPARIN (PORCINE) IN NACL 1000-0.9 UT/500ML-% IV SOLN
INTRAVENOUS | Status: DC | PRN
  Administered 2020-06-16 (×2): 500 mL

## 2020-06-16 MED ORDER — LABETALOL HCL 5 MG/ML IV SOLN: 10.0000 mg | INTRAVENOUS | Status: AC | PRN

## 2020-06-16 MED ORDER — SODIUM CHLORIDE 0.9 % WEIGHT BASED INFUSION: 1.0000 mL/kg/h | INTRAVENOUS | Status: DC

## 2020-06-16 MED ORDER — SODIUM CHLORIDE 0.9 % IV SOLN
250.0000 mL | INTRAVENOUS | Status: DC | PRN
Start: 2020-06-16 — End: 2020-06-16

## 2020-06-16 MED ORDER — SODIUM CHLORIDE 0.9 % IV SOLN: 250.0000 mL | INTRAVENOUS | Status: DC | PRN

## 2020-06-16 MED ORDER — IOHEXOL 350 MG/ML SOLN
INTRAVENOUS | Status: DC | PRN
  Administered 2020-06-16: 110 mL

## 2020-06-16 MED ORDER — NITROGLYCERIN 0.4 MG SL SUBL: 0.4000 mg | SUBLINGUAL_TABLET | SUBLINGUAL | Status: DC | PRN

## 2020-06-16 MED ORDER — ATORVASTATIN CALCIUM 80 MG PO TABS
80.0000 mg | ORAL_TABLET | Freq: Every evening | ORAL | Status: DC
  Administered 2020-06-17: 80 mg via ORAL
  Filled 2020-06-16: qty 1

## 2020-06-16 MED ORDER — SODIUM CHLORIDE 0.9% FLUSH
3.0000 mL | Freq: Two times a day (BID) | INTRAVENOUS | Status: DC
Start: 2020-06-16 — End: 2020-06-16

## 2020-06-16 MED ORDER — NITROGLYCERIN IN D5W 200-5 MCG/ML-% IV SOLN
30.0000 ug/min | INTRAVENOUS | Status: DC
  Administered 2020-06-16 – 2020-06-18 (×4): 100 ug/min via INTRAVENOUS
  Filled 2020-06-16 (×4): qty 250

## 2020-06-16 MED ORDER — ASPIRIN 81 MG PO CHEW
81.0000 mg | CHEWABLE_TABLET | ORAL | Status: DC
  Filled 2020-06-16: qty 1

## 2020-06-16 MED ORDER — SODIUM CHLORIDE 0.9% FLUSH
3.0000 mL | INTRAVENOUS | Status: DC | PRN
Start: 2020-06-16 — End: 2020-06-16

## 2020-06-16 MED ORDER — FENOFIBRATE 160 MG PO TABS
160.0000 mg | ORAL_TABLET | Freq: Every day | ORAL | Status: DC
  Administered 2020-06-17: 160 mg via ORAL
  Filled 2020-06-16: qty 1

## 2020-06-16 MED ORDER — SODIUM CHLORIDE 0.9% FLUSH
3.0000 mL | Freq: Two times a day (BID) | INTRAVENOUS | Status: DC
  Administered 2020-06-17 (×2): 3 mL via INTRAVENOUS

## 2020-06-16 MED ORDER — SODIUM CHLORIDE 0.9 % IV SOLN: INTRAVENOUS | Status: AC

## 2020-06-16 MED ORDER — ASPIRIN 81 MG PO CHEW
81.0000 mg | CHEWABLE_TABLET | Freq: Every day | ORAL | Status: DC
  Administered 2020-06-17: 81 mg via ORAL
  Filled 2020-06-16: qty 1

## 2020-06-16 MED ORDER — MIDAZOLAM HCL 2 MG/2ML IJ SOLN
INTRAMUSCULAR | Status: AC
  Filled 2020-06-16: qty 2

## 2020-06-16 MED ORDER — MIDAZOLAM HCL 2 MG/2ML IJ SOLN
INTRAMUSCULAR | Status: DC | PRN
  Administered 2020-06-16 (×2): 1 mg via INTRAVENOUS

## 2020-06-16 MED ORDER — SODIUM CHLORIDE 0.9% FLUSH: 3.0000 mL | INTRAVENOUS | Status: DC | PRN

## 2020-06-16 MED ORDER — SODIUM CHLORIDE 0.9 % WEIGHT BASED INFUSION: 3.0000 mL/kg/h | INTRAVENOUS | Status: DC

## 2020-06-16 MED ORDER — DULOXETINE HCL 30 MG PO CPEP
30.0000 mg | ORAL_CAPSULE | Freq: Every day | ORAL | Status: DC
  Administered 2020-06-17: 30 mg via ORAL
  Filled 2020-06-16: qty 1

## 2020-06-16 MED ORDER — ONDANSETRON HCL 4 MG/2ML IJ SOLN
INTRAMUSCULAR | Status: AC
  Filled 2020-06-16: qty 2

## 2020-06-16 MED ORDER — NITROGLYCERIN IN D5W 200-5 MCG/ML-% IV SOLN
INTRAVENOUS | Status: AC
  Filled 2020-06-16: qty 250

## 2020-06-16 MED ORDER — METOPROLOL SUCCINATE ER 25 MG PO TB24
25.0000 mg | ORAL_TABLET | Freq: Every day | ORAL | Status: DC
  Administered 2020-06-17: 25 mg via ORAL
  Filled 2020-06-16: qty 1

## 2020-06-16 MED ORDER — PANTOPRAZOLE SODIUM 40 MG PO TBEC
80.0000 mg | DELAYED_RELEASE_TABLET | Freq: Every day | ORAL | Status: DC
  Administered 2020-06-17: 80 mg via ORAL
  Filled 2020-06-16: qty 2

## 2020-06-16 SURGICAL SUPPLY — 10 items
CATH INFINITI 5 FR IM (CATHETERS) ×1 IMPLANT
CATH INFINITI 5FR MPB2 (CATHETERS) ×1 IMPLANT
CLOSURE MYNX CONTROL 5F (Vascular Products) ×1 IMPLANT
KIT HEART LEFT (KITS) ×2 IMPLANT
PACK CARDIAC CATHETERIZATION (CUSTOM PROCEDURE TRAY) ×2 IMPLANT
SHEATH PINNACLE 5F 10CM (SHEATH) ×1 IMPLANT
SHEATH PROBE COVER 6X72 (BAG) ×1 IMPLANT
TRANSDUCER W/STOPCOCK (MISCELLANEOUS) ×2 IMPLANT
TUBING CIL FLEX 10 FLL-RA (TUBING) ×2 IMPLANT
WIRE EMERALD 3MM-J .035X150CM (WIRE) ×1 IMPLANT

## 2020-06-16 NOTE — Consult Note (Addendum)
[address]       [place] [address]             [phone]        Gregory Gardner [facility] Medical Record #[id number] Date of Birth: 05/22/1961  Referring: Estrella Deeds Primary Care: Caffie Damme, MD Primary Cardiologist:Peter Eden Emms, MD  Chief Complaint:  ACS  History of Present Illness:      Gregory Gardner is a 59 yo male with history of MS, known CAD S/P CABG x 5 performed in 2003 by me  with vein taken from right leg and left ima . 11/28/2001 at the time of bypass at the left internal mammary to the left anterior descending coronary artery sequential reverse saphenous vein graft to the diagonal and circumflex coronary artery reverse saphenous vein graft to the posterior descending and posterior lateral branches of the right coronary artery.  On review of the operative report it was noted that the branches of the right coronary system including the posterior descending posterior lateral were very small vessels barely admitting a 1 mm probe diagonal coronary artery was slightly larger as was the circumflex.,    He presented to OSH with complaints of chest pain.  The patient states this developed acutely about 30 min prior to presenting to ED.  He was ruled in for ACS/NSTEMI and transferred to [facility] for evaluation.  He was taken to the catheterization lab and found to have multivessel CAD with patent LIMA to LAD, and severe disease in previous saphenous vein grafts.  He had a mildly reduced EF of 40-50%.  It was felt coronary bypass grafting would be indicated.  Currently the patient is chest pain free.  He denied N/V and diaphoresis on presentation.  He states when he had this before it was much worse.  He denies history of smoking.  He does have MS which he receives IV Ocrelizumab every 6 months.  He just received a dose of this the other day.  He is functional and do his daily activities without difficulty.  He is currently off work on workers  compensation due to a back injury, stating he was due to have an MRI tomorrow to evaluate this.  The patient developed N/V during our conversation/exam.  He has a positive family history of CAD.  He also notes he had previous prostate surgery which causes him difficulty emptying his bladder unless he is sitting down.     Current Activity/ Functional Status: Patient is independent with mobility/ambulation, transfers, ADL's, IADL's.   Zubrod Score: At the time of surgery this patient's most appropriate activity status/level should be described as: []     0    Normal activity, no symptoms [x]     1    Restricted in physical strenuous activity but ambulatory, able to do out light work []     2    Ambulatory and capable of self care, unable to do work activities, up and about                 more than 50%  Of the time                            []     3    Only limited self care, in bed greater than 50% of waking hours []     4    Completely disabled, no self care, confined to bed or  chair []     5    Moribund  Past Medical History:  Diagnosis Date  . Chest pain   . Claudication (HCC)   . Mixed hyperlipidemia   . Multiple sclerosis (HCC)     Past Surgical History:  Procedure Laterality Date  . LEFT HEART CATH AND CORS/GRAFTS ANGIOGRAPHY N/A 06/16/2020   Procedure: LEFT HEART CATH AND CORS/GRAFTS ANGIOGRAPHY;  Surgeon: 06/18/2020, MD;  Location: [facility] INVASIVE CV LAB;  Service: Cardiovascular;  Laterality: N/A;  . MANDIBLE SURGERY      Social History   Tobacco Use  Smoking Status Never Smoker  Smokeless Tobacco Never Used    Social History   Substance and Sexual Activity  Alcohol Use Yes     Allergies  Allergen Reactions  . Lodine [Etodolac] Nausea Only    Current Facility-Administered Medications  Medication Dose Route Frequency Provider Last Rate Last Admin  . 0.9 %  sodium chloride infusion  250 mL Intravenous PRN Lyn Records, MD      . acetaminophen (TYLENOL) tablet 650  mg  650 mg Oral Q4H PRN Lyn Records, MD   650 mg at 06/17/20 1438  . albuterol (PROVENTIL) (2.5 MG/3ML) 0.083% nebulizer solution 2.5 mg  2.5 mg Nebulization Q6H PRN Dunn, Dayna N, PA-C      . aspirin chewable tablet 81 mg  81 mg Oral Daily 04-[birth year]-1970, MD   81 mg at 06/17/20 0801  . atorvastatin (LIPITOR) tablet 80 mg  80 mg Oral QPM Dunn, Dayna N, PA-C   80 mg at 06/17/20 1635  . [START ON 06/18/2020] cefUROXime (ZINACEF) 1.5 g in sodium chloride 0.9 % 100 mL IVPB  1.5 g Intravenous To OR 06/20/2020, MD      . Delight Ovens ON 06/18/2020] cefUROXime (ZINACEF) 750 mg in sodium chloride 0.9 % 100 mL IVPB  750 mg Intravenous To OR 06/20/2020, MD      . Delight Ovens ON 06/18/2020] dexmedetomidine (PRECEDEX) 400 MCG/100ML (4 mcg/mL) infusion  0.1-0.7 mcg/kg/hr Intravenous To OR 06/20/2020, MD      . DULoxetine (CYMBALTA) DR capsule 30 mg  30 mg Oral Daily Dunn, Dayna N, PA-C   30 mg at 06/17/20 0800  . [START ON 06/18/2020] EPINEPHrine (ADRENALIN) 4 mg in NS 250 mL (0.016 mg/mL) premix infusion  0-10 mcg/min Intravenous To OR 06/20/2020, MD      . ezetimibe (ZETIA) tablet 10 mg  10 mg Oral Daily Delight Ovens, MD   10 mg at 06/17/20 0803  . fenofibrate tablet 160 mg  160 mg Oral Daily 06/19/20, RPH   160 mg at 06/17/20 0801  . [START ON 06/18/2020] heparin 30,000 units/NS 1000 mL solution for CELLSAVER   Other To OR 06/20/2020, MD      . heparin ADULT infusion 100 units/mL (25000 units/229mL sodium chloride 0.45%)  1,150 Units/hr Intravenous Continuous 45m, RPH 11.5 mL/hr at 06/17/20 1442 1,150 Units/hr at 06/17/20 1442  . [START ON 06/18/2020] heparin sodium (porcine) 2,500 Units, papaverine 30 mg in electrolyte-148 (PLASMALYTE-148) 500 mL irrigation   Irrigation To OR 06/20/2020, MD      . Delight Ovens ON 06/18/2020] insulin regular, human (MYXREDLIN) 100 units/ 100 mL infusion   Intravenous To OR 06/20/2020, MD      . Delight Ovens ON 06/18/2020]  magnesium sulfate (IV Push/IM) injection 40 mEq  40 mEq Other To OR 06/20/2020, MD      .  metoprolol succinate (TOPROL-XL) 24 hr tablet 25 mg  25 mg Oral Daily Lyn Records, MD   25 mg at 06/17/20 0802  . [START ON 06/18/2020] milrinone (PRIMACOR) 20 MG/100 ML (0.2 mg/mL) infusion  0.3 mcg/kg/min Intravenous To OR Delight Ovens, MD      . nitroGLYCERIN (NITROSTAT) SL tablet 0.4 mg  0.4 mg Sublingual Q5 min PRN Lyn Records, MD      . nitroGLYCERIN 50 mg in dextrose 5 % 250 mL (0.2 mg/mL) infusion  30-200 mcg/min Intravenous Titrated Lyn Records, MD 30 mL/hr at 06/17/20 1635 100 mcg/min at 06/17/20 1635  . [START ON 06/18/2020] nitroGLYCERIN 50 mg in dextrose 5 % 250 mL (0.2 mg/mL) infusion  2-200 mcg/min Intravenous To OR Delight Ovens, MD      . Melene Muller ON 06/18/2020] norepinephrine (LEVOPHED)  in premix infusion  0-40 mcg/min Intravenous To OR Delight Ovens, MD      . ondansetron [facility]) injection 4 mg  4 mg Intravenous Q6H PRN Lyn Records, MD   4 mg at 06/16/20 1722  . pantoprazole (PROTONIX) EC tablet 80 mg  80 mg Oral Daily Dunn, Dayna N, PA-C   80 mg at 06/17/20 0759  . [START ON 06/18/2020] phenylephrine (NEOSYNEPHRINE) 20-0.9 MG/250ML-% infusion  30-200 mcg/min Intravenous To OR Delight Ovens, MD      . Melene Muller ON 06/18/2020] potassium chloride injection 80 mEq  80 mEq Other To OR Delight Ovens, MD      . sodium chloride flush (NS) 0.9 % injection 3 mL  3 mL Intravenous Q12H Lyn Records, MD   3 mL at 06/17/20 0803  . sodium chloride flush (NS) 0.9 % injection 3 mL  3 mL Intravenous PRN Lyn Records, MD      . Melene Muller ON 06/18/2020] tranexamic acid (CYKLOKAPRON) 2,500 mg in sodium chloride 0.9 % 250 mL (10 mg/mL) infusion  1.5 mg/kg/hr Intravenous To OR Delight Ovens, MD      . Melene Muller ON 06/18/2020] tranexamic acid (CYKLOKAPRON) bolus via infusion - over 30 minutes 1,152 mg  15 mg/kg Intravenous To OR Delight Ovens, MD      . Melene Muller ON  06/18/2020] tranexamic acid (CYKLOKAPRON) pump prime solution 154 mg  2 mg/kg Intracatheter To OR Delight Ovens, MD      . Melene Muller ON 06/18/2020] vancomycin (VANCOREADY) IVPB 1250 mg/250 mL  1,250 mg Intravenous To OR Delight Ovens, MD        Medications Prior to Admission  Medication Sig Dispense Refill Last Dose  . Albuterol Sulfate (VENTOLIN HFA IN) Inhale 1 puff into the lungs daily as needed (for wheezing).    unknown at Unknown time  . alendronate (FOSAMAX) 70 MG tablet Take 70 mg by mouth once a week. Take with a full glass of water on an empty stomach.   Past Week at Unknown time  . cholecalciferol (VITAMIN D) 1000 UNITS tablet Take 1,000 Units by mouth daily.   06/15/2020 at Unknown time  . Choline Fenofibrate (FENOFIBRIC ACID) 135 MG CPDR Take 1 tablet by mouth daily.   06/15/2020 at Unknown time  . ezetimibe (ZETIA) 10 MG tablet Take 10 mg by mouth daily.   06/15/2020 at Unknown time  . metoprolol succinate (TOPROL-XL) 25 MG 24 hr tablet Take 25 mg by mouth daily.   06/15/2020 at 0800  . nitroGLYCERIN (NITROSTAT) 0.4 MG SL tablet Place 0.4 mg under the tongue every 5 (five) minutes  as needed for chest pain.    06/15/2020 at Unknown time  . ocrelizumab 300 mg in sodium chloride 0.9 % 250 mL Inject 300 mg into the vein every 6 (six) months.   06/09/2020 at Unknown time  . omeprazole (PRILOSEC) 40 MG capsule Take 40 mg by mouth daily.   06/15/2020 at Unknown time    Family History  Problem Relation Age of Onset  . Heart Problems Father      Review of Systems:       Cardiac Review of Systems: Y or  [    ]= no  Chest Pain [  Y, resolved  ]  Resting SOB [ N  ] Exertional SOB  [ N ]  Orthopnea [  ]   Pedal Edema Klaus.Mock  ]    Palpitations [ N ] Syncope  [  ]   Presyncope [   ]  General Review of Systems: [Y] = yes [  ]=no Constitional: recent weight change [  ]; anorexia [  ]; fatigue [N  ]; nausea [Y  ]; night sweats [  ]; fever [  ]; or chills [  ]                                                                Dental: Last Dentist visit:   Eye : blurred vision [  ]; diplopia [   ]; vision changes [  ];  Amaurosis fugax[  ]; Resp: cough Klaus.Mock  ];  wheezing[  ];  hemoptysis[  ]; shortness of breath[  ]; paroxysmal nocturnal dyspnea[  ]; dyspnea on exertion[N  ]; or orthopnea[  ];  GI:  gallstones[  ], vomiting[Y  ];  dysphagia[  ]; melena[  ];  hematochezia [  ]; heartburn[  ];   Hx of  Colonoscopy[  ]; GU: kidney stones [  ]; hematuria[  ];   dysuria [  ];  nocturia[  ];  history of     obstruction [  ]; urinary frequency [  ] difficulty voiding due to previous prostate surgery             Skin: rash, swelling[  ];, hair loss[  ];  peripheral edema[  ];  or itching[  ]; previous EVH on Right Musculosketetal: myalgias[  ];  joint swelling[  ];  joint erythema[  ];  joint pain[  ];  back pain[  ];  Heme/Lymph: bruising[Y ];  bleeding[  ];  anemia[  ];  Neuro: TIA[  ];  headaches[  ];  stroke[  ];  vertigo[  ];  seizures[  ];   paresthesias[  ];  difficulty walking[N ];  Psych:depression[N  ]; anxiety[ N ];  Endocrine: diabetes[N  ];  thyroid dysfunction[  ];       Physical Exam: BP 98/66 (BP Location: Left Arm)   Pulse 71   Temp 98.4 F (36.9 C) (Oral)   Resp 17   Wt 76.8 kg   SpO2 100%   BMI 23.61 kg/m    General appearance: ill appearing Head: Normocephalic, without obvious abnormality, atraumatic Neck: no adenopathy, no carotid bruit, no JVD, supple, symmetrical, trachea midline and thyroid not enlarged, symmetric, no tenderness/mass/nodules Resp: clear to auscultation bilaterally Cardio: regular rate and rhythm  and previous sternotomy scar GI: soft, non-tender; bowel sounds normal; no masses,  no organomegaly Extremities: extremities normal, atraumatic, no cyanosis or edema and previous EVH scar on right leg Neurologic: Grossly normal  Diagnostic Studies & Laboratory data:     Recent Radiology Findings:   CARDIAC CATHETERIZATION  CONCLUSIONS:  Severe  disease in the sequential saphenous vein graft to the diagonal and obtuse marginal. The continuation to the obtuse marginal is totally occluded. Proximal to mid vessel contains shaggy 99% regions of stenosis. The marginal fills by collaterals.  Severe disease in the sequential saphenous vein graft to the PDA and second left ventricular branch with occlusion of the distal anastomosis to the left ventricular branch. The ostial to proximal graft contains 75% stenosis. The insertion site into the PDA is 70% narrowed. The proximal and distal PDA at the insertion site contains high-grade stenosis. Second and third left ventricular branches fill by collaterals.  Patent LIMA to LAD.  Left main is patent  Proximal LAD is totally occluded  Circumflex is ostially occluded  Native RCA is distally occluded  Anterior wall moderate hypokinesis. EF 40 to 50%. LVEDP is normal.  RECOMMENDATIONS:   Considering the patient's young age, surgical revascularization would be a better and more sturdy long-term option grafting the diagonal, circumflex, and right coronary territory including the left ventricular branches.  Request consultation from Dr.Advik Weatherspoon to determine if patient could be a redo candidate. If not the diagonal graft could be stented as well as the proximal RCA graft but not likely very durable over time.            I have independently reviewed the above radiologic studies and discussed with the patient   Recent Lab Findings: Lab Results  Component Value Date   WBC 9.3 06/17/2020   HGB 12.6 (L) 06/17/2020   HCT 38.1 (L) 06/17/2020   PLT 183 06/17/2020   GLUCOSE 130 (H) 06/17/2020   CHOL 195 06/17/2020   TRIG 159 (H) 06/17/2020   HDL 44 06/17/2020   LDLCALC 119 (H) 06/17/2020   NA 139 06/17/2020   K 3.4 (L) 06/17/2020   CL 108 06/17/2020   CREATININE 0.88 06/17/2020   BUN 6 06/17/2020   CO2 23 06/17/2020    Assessment / Plan:      1. CAD, patent LIMA to LAD, severe  disease in previous seq SVG.. requesting CABG consult. patient had previous endovein harvest from his right leg 2. MS- received IF Ocerlizumab recently, patient is functional and has no activity limitation 3. HTN 4. Hyperlipedemia 5. Dispo- patient stable, w/o chest pain on NTG drip...   Doppler studies reveal that the radial arteries are the principal supplier of the hands bilaterally   I have reviewed the patient's cardiac cath Doppler studies previous op report.  I agree with Dr. Katrinka Blazing that with the patient's age the durability of stenting to vein grafts is likely short-lived.  Redo coronary artery bypass does carry some increased risk compared to his initial operation, considering the distal right vessels are extremely small, the left system is totally occluded and based on mammary and vein bypass flow.  I reviewed the risks and options with the patient and he is agreeable with proceeding with redo coronary artery bypass grafting.  We will consider using the right internal mammary artery possibly as a free graft.  He has suitable graft material in the left leg in the right thigh.   The goals risks and alternatives of the planned surgical procedure coronary artery  bypass grafting have been discussed with the patient in detail. The risks of the procedure including death, infection, stroke, myocardial infarction, bleeding, blood transfusion have all been discussed specifically.  I have quoted Gregory Gardner a 6 % of perioperative mortality and a complication rate as high as 40 %. The patient's questions have been answered.Gregory Gardner is willing  to proceed with the planned procedure.

## 2020-06-16 NOTE — Progress Notes (Signed)
Arrived to holding area by [facility]. On heparin infusion at 1050 U/hr and NTG drip at infusing to left ac. Saline lock rt ac

## 2020-06-16 NOTE — Plan of Care (Signed)
  Problem: Coping: Goal: Level of anxiety will decrease Outcome: Progressing   Problem: Pain Managment: Goal: General experience of comfort will improve Outcome: Progressing   Problem: Safety: Goal: Ability to remain free from injury will improve Outcome: Progressing   

## 2020-06-16 NOTE — CV Procedure (Signed)
CONCLUSIONS:  Severe disease in the sequential saphenous vein graft to the diagonal and obtuse marginal. The continuation to the obtuse marginal is totally occluded. Proximal to mid vessel contains shaggy 99% regions of stenosis. The marginal fills by collaterals.  Severe disease in the sequential saphenous vein graft to the PDA and second left ventricular branch with occlusion of the distal anastomosis to the left ventricular branch. The ostial to proximal graft contains 75% stenosis. The insertion site into the PDA is 70% narrowed. The proximal and distal PDA at the insertion site contains high-grade stenosis. Second and third left ventricular branches fill by collaterals.  Patent LIMA to LAD.  Left main is patent  Proximal LAD is totally occluded  Circumflex is ostially occluded  Native RCA is distally occluded  Anterior wall moderate hypokinesis. EF 40 to 50%. LVEDP is normal.  RECOMMENDATIONS:   Considering the patient's young age, surgical revascularization would be a better and more sturdy long-term option grafting the diagonal, circumflex, and right coronary territory including the left ventricular branches.  Request consultation from Dr.[name] to determine if patient could be a redo candidate. If not the diagonal graft could be stented as well as the proximal RCA graft but not likely very durable over time.

## 2020-06-16 NOTE — Progress Notes (Addendum)
Dr. Katrinka Blazing requested assistance clarifying home med regimen/med rec orders. Per previous notes his [facility] notes are being scanned in to serve as H/P. I have reviewed these notes to become familiar with the patient. Briefly, pt is 59 y/o M with history of multiple sclerosis, CAD with distant history of MI and CABG 2003, prior tobacco abuse, mixed HLD. He has been followed by Dr. 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 Dr. Fabio Bering remote note in 2017 indicated he was not on statin due to LFTs elevation - has not had statin on our list in a long time.   Per notes from OSH he presented to [facility] ED late last night with chest pain and mildly elevated troponin. He received full dose ASA via EMS. At [facility] he was started on IV heparin, IV NTG, and Crestor 40mg . Labs had shown Hgb 14, Na 136, K 3.8, Cr 1.00, glucose 127, troponin 0.07-> 0.19-> 2.13, d-dimer wnl, BNP wnl, LFTs wnl, Covid negative, CXR NAD. He was seen by Dr. and trasferred to [facility] for cath. Dr[facility] has consulted CVTS for redo CABG.  He reports home med list includes: Fenofibric acid 135mg  daily ASA 81mg  daily but had not been taking recently due to taking BC powders for back problems Omeprazole 40mg  daily Toprol 25mg  daily Zetia 10mg  daily MagOx 250mg  - taking inconsistently so will plan to obtain mag level with AM labs to guide rx Ventolin HFA PRN Duloxetine 30mg  daily (pt reports for RLS) Ocrelizumab injection q6 months - last injection last week   I did speak with pharmacy team who have all newly arriving patient charts flagged to update med rec during their stay. He reports he had been on Crestor 40mg  as an outpatient but reports his PCP took him off this a while ago. Dr. Katrinka Blazing initiated Crestor 40mg  daily at [facility] which he appears to have gotten today. Dr. has entered order for ASA and atorvastatin for today along with resumption of heparin per pharmacy after sheath pull. Will reorder other  home meds as appropriate and clarify plan for statin with Dr. . Addendum: per Dr. , OK to use either, would check f/u liver and lipid in 6 weeks. If enzymes elevated PCSK-9 would then be needed. Will order atorvastatin as previously recommended to begin tomorrow.  Xavia Kniskern PA-C

## 2020-06-16 NOTE — H&P (Signed)
Please see the recent history and physical, cardiology consultation, and clinical data and the records from [facility] better scanned into the medical records.  ACS presentation with mild elevation in troponins and associated ST segment depression on EKG.  Left heart cath, coronary bypass graft angiography, left ventriculography will be performed via right femoral approach.  No bypass grafts include sequential SVG to PL and PDA, sequential SVG to diagonal and obtuse marginal, and LIMA to LAD.  Kidney function and hemoglobin are normal.

## 2020-06-16 NOTE — Progress Notes (Signed)
ANTICOAGULATION CONSULT NOTE - Initial Consult  Pharmacy Consult for heparin Indication: chest pain/ACS  Allergies  Allergen Reactions  . Lodine [Etodolac] Nausea Only    Patient Measurements:   Heparin Dosing Weight: 78kg  Vital Signs: Temp: 99.5 F (37.5 C) (08/16 2019) Temp Source: Oral (08/16 2019) BP: 93/58 (08/16 2019) Pulse Rate: 72 (08/16 2019)  Labs: No results for input(s): HGB, HCT, PLT, APTT, LABPROT, INR, HEPARINUNFRC, HEPRLOWMOCWT, CREATININE, CKTOTAL, CKMB, TROPONINIHS in the last 72 hours.  CrCl cannot be calculated (No successful lab value found.).   Medical History: Past Medical History:  Diagnosis Date  . Chest pain   . Claudication (HCC)   . Mixed hyperlipidemia   . Multiple sclerosis (HCC)      Assessment: 48 yoM transferred from OSH for CP now s/p cath with plans for surgical consult. Pharmacy to restart IV heparin 8hr after sheath removal (1512). No AC noted PTA.  Goal of Therapy:  Heparin level 0.3-0.7 units/ml Monitor platelets by anticoagulation protocol: Yes   Plan:  -Heparin 950 units/h no bolus at 2330 -Check 6hr heparin level -Daily heparin level and CBC   Fredonia Highland, PharmD, BCPS Clinical Pharmacist [phone] Please check AMION for all [facility] Pharmacy numbers 06/16/2020

## 2020-06-16 NOTE — Progress Notes (Signed)
TCTS consulted for redo CABG evaluation.

## 2020-06-17 ENCOUNTER — Encounter (HOSPITAL_COMMUNITY): Payer: Self-pay | Admitting: Interventional Cardiology

## 2020-06-17 ENCOUNTER — Inpatient Hospital Stay: Payer: Managed Care, Other (non HMO)

## 2020-06-17 ENCOUNTER — Encounter: Payer: Self-pay | Admitting: Cardiothoracic Surgery

## 2020-06-17 ENCOUNTER — Inpatient Hospital Stay (HOSPITAL_COMMUNITY): Payer: Managed Care, Other (non HMO)

## 2020-06-17 DIAGNOSIS — Z01810 Encounter for preprocedural cardiovascular examination: Secondary | ICD-10-CM

## 2020-06-17 DIAGNOSIS — I2510 Atherosclerotic heart disease of native coronary artery without angina pectoris: Secondary | ICD-10-CM

## 2020-06-17 LAB — BASIC METABOLIC PANEL
Anion gap: 8 (ref 5–15)
BUN: 6 mg/dL (ref 6–20)
CO2: 23 mmol/L (ref 22–32)
Calcium: 8.2 mg/dL — ABNORMAL LOW (ref 8.9–10.3)
Chloride: 108 mmol/L (ref 98–111)
Creatinine, Ser: 0.88 mg/dL (ref 0.61–1.24)
GFR calc Af Amer: >60 mL/min (ref >60)
GFR calc non Af Amer: >60 mL/min (ref >60)
Glucose, Bld: 130 mg/dL — ABNORMAL HIGH (ref 70–99)
Potassium: 3.4 mmol/L — ABNORMAL LOW (ref 3.5–5.1)
Sodium: 139 mmol/L (ref 135–145)

## 2020-06-17 LAB — CBC
HCT: 38.1 % — ABNORMAL LOW (ref 39.0–52.0)
Hemoglobin: 12.6 g/dL — ABNORMAL LOW (ref 13.0–17.0)
MCH: 31 pg (ref 26.0–34.0)
MCHC: 33.1 g/dL (ref 30.0–36.0)
MCV: 93.8 fL (ref 80.0–100.0)
Platelets: 183 10*3/uL (ref 150–400)
RBC: 4.06 MIL/uL — ABNORMAL LOW (ref 4.22–5.81)
RDW: 13.6 % (ref 11.5–15.5)
WBC: 9.3 10*3/uL (ref 4.0–10.5)
nRBC: 0 % (ref 0.0–0.2)

## 2020-06-17 LAB — SPIROMETRY WITH GRAPH
FEF 25-75 Pre: 0.75 L/sec
FEF2575-%Pred-Pre: 24 %
FEV1-%Pred-Pre: 45 %
FEV1-Pre: 1.71 L
FEV1FVC-%Pred-Pre: 74 %
FEV6-%Pred-Pre: 58 %
FEV6-Pre: 2.76 L
FEV6FVC-%Pred-Pre: 95 %
FVC-%Pred-Pre: 60 %
FVC-Pre: 3.02 L
Pre FEV1/FVC ratio: 57 %
Pre FEV6/FVC Ratio: 91 %

## 2020-06-17 LAB — ECHOCARDIOGRAM COMPLETE
Area-P 1/2: 4.68 cm2
S' Lateral: 3.7 cm
Weight: 2708.8 oz

## 2020-06-17 LAB — SURGICAL PCR SCREEN
MRSA, PCR: POSITIVE — AB
Staphylococcus aureus: POSITIVE — AB

## 2020-06-17 LAB — HEPARIN LEVEL (UNFRACTIONATED)
Heparin Unfractionated: <0.1 IU/mL — ABNORMAL LOW (ref 0.30–0.70)
Heparin Unfractionated: 0.26 IU/mL — ABNORMAL LOW (ref 0.30–0.70)

## 2020-06-17 LAB — LIPID PANEL
Cholesterol: 195 mg/dL (ref 0–200)
HDL: 44 mg/dL (ref >40)
LDL Cholesterol: 119 mg/dL — ABNORMAL HIGH (ref 0–99)
Total CHOL/HDL Ratio: 4.4 RATIO
Triglycerides: 159 mg/dL — ABNORMAL HIGH (ref <150)
VLDL: 32 mg/dL (ref 0–40)

## 2020-06-17 LAB — MAGNESIUM: Magnesium: 2.1 mg/dL (ref 1.7–2.4)

## 2020-06-17 LAB — PREPARE RBC (CROSSMATCH)

## 2020-06-17 LAB — GLUCOSE, CAPILLARY: Glucose-Capillary: 115 mg/dL — ABNORMAL HIGH (ref 70–99)

## 2020-06-17 LAB — ABO/RH: ABO/RH(D): A POS

## 2020-06-17 LAB — SARS CORONAVIRUS 2 BY RT PCR (HOSPITAL ORDER, PERFORMED IN ~~LOC~~ HOSPITAL LAB): SARS Coronavirus 2: NEGATIVE

## 2020-06-17 MED ORDER — PLASMA-LYTE 148 IV SOLN
INTRAVENOUS | Status: DC
  Filled 2020-06-17: qty 2.5

## 2020-06-17 MED ORDER — NOREPINEPHRINE 4 MG/250ML-% IV SOLN
0.0000 ug/min | INTRAVENOUS | Status: DC
  Filled 2020-06-17: qty 250

## 2020-06-17 MED ORDER — VANCOMYCIN HCL 1250 MG/250ML IV SOLN
1250.0000 mg | INTRAVENOUS | Status: AC
  Administered 2020-06-18: 1250 mg via INTRAVENOUS
  Filled 2020-06-17: qty 250

## 2020-06-17 MED ORDER — MUPIROCIN 2 % EX OINT
1.0000 "application " | TOPICAL_OINTMENT | Freq: Two times a day (BID) | CUTANEOUS | Status: AC
  Administered 2020-06-18 – 2020-06-22 (×9): 1 via NASAL
  Filled 2020-06-17 (×3): qty 22

## 2020-06-17 MED ORDER — INSULIN REGULAR(HUMAN) IN NACL 100-0.9 UT/100ML-% IV SOLN
INTRAVENOUS | Status: AC
  Administered 2020-06-18: 1 [IU]/h via INTRAVENOUS
  Filled 2020-06-17: qty 100

## 2020-06-17 MED ORDER — SODIUM CHLORIDE 0.9 % IV SOLN
750.0000 mg | INTRAVENOUS | Status: AC
  Administered 2020-06-18: 750 mg via INTRAVENOUS
  Filled 2020-06-17: qty 750

## 2020-06-17 MED ORDER — BISACODYL 5 MG PO TBEC: 5.0000 mg | DELAYED_RELEASE_TABLET | Freq: Once | ORAL | Status: DC

## 2020-06-17 MED ORDER — EPINEPHRINE HCL 5 MG/250ML IV SOLN IN NS
0.0000 ug/min | INTRAVENOUS | Status: DC
  Filled 2020-06-17: qty 250

## 2020-06-17 MED ORDER — DEXMEDETOMIDINE HCL IN NACL 400 MCG/100ML IV SOLN
0.1000 ug/kg/h | INTRAVENOUS | Status: AC
  Administered 2020-06-18: .3 ug/kg/h via INTRAVENOUS
  Filled 2020-06-17: qty 100

## 2020-06-17 MED ORDER — CHLORHEXIDINE GLUCONATE CLOTH 2 % EX PADS
6.0000 | MEDICATED_PAD | Freq: Every day | CUTANEOUS | Status: DC
  Administered 2020-06-18: 6 via TOPICAL

## 2020-06-17 MED ORDER — PHENYLEPHRINE HCL-NACL 20-0.9 MG/250ML-% IV SOLN
30.0000 ug/min | INTRAVENOUS | Status: AC
  Administered 2020-06-18: 25 ug/min via INTRAVENOUS
  Filled 2020-06-17: qty 250

## 2020-06-17 MED ORDER — TRANEXAMIC ACID 1000 MG/10ML IV SOLN
1.5000 mg/kg/h | INTRAVENOUS | Status: AC
  Administered 2020-06-18: 1.5 mg/kg/h via INTRAVENOUS
  Filled 2020-06-17: qty 25

## 2020-06-17 MED ORDER — TRANEXAMIC ACID (OHS) PUMP PRIME SOLUTION
2.0000 mg/kg | INTRAVENOUS | Status: DC
  Filled 2020-06-17: qty 1.54

## 2020-06-17 MED ORDER — TRANEXAMIC ACID (OHS) BOLUS VIA INFUSION
15.0000 mg/kg | INTRAVENOUS | Status: AC
  Administered 2020-06-18: 1152 mg via INTRAVENOUS
  Filled 2020-06-17: qty 1152

## 2020-06-17 MED ORDER — POTASSIUM CHLORIDE 2 MEQ/ML IV SOLN
80.0000 meq | INTRAVENOUS | Status: DC
  Filled 2020-06-17: qty 40

## 2020-06-17 MED ORDER — MILRINONE LACTATE IN DEXTROSE 20-5 MG/100ML-% IV SOLN
0.3000 ug/kg/min | INTRAVENOUS | Status: AC
  Administered 2020-06-18: .3 ug/kg/min via INTRAVENOUS
  Filled 2020-06-17: qty 100

## 2020-06-17 MED ORDER — CHLORHEXIDINE GLUCONATE CLOTH 2 % EX PADS
6.0000 | MEDICATED_PAD | Freq: Once | CUTANEOUS | Status: AC
  Administered 2020-06-17: 6 via TOPICAL

## 2020-06-17 MED ORDER — MAGNESIUM SULFATE 50 % IJ SOLN
40.0000 meq | INTRAMUSCULAR | Status: DC
  Filled 2020-06-17: qty 9.85

## 2020-06-17 MED ORDER — METOPROLOL TARTRATE 12.5 MG HALF TABLET
12.5000 mg | ORAL_TABLET | Freq: Once | ORAL | Status: AC
  Administered 2020-06-18: 12.5 mg via ORAL
  Filled 2020-06-17: qty 1

## 2020-06-17 MED ORDER — SODIUM CHLORIDE 0.9 % IV SOLN
1.5000 g | INTRAVENOUS | Status: AC
  Administered 2020-06-18: 1.5 g via INTRAVENOUS
  Filled 2020-06-17: qty 1.5

## 2020-06-17 MED ORDER — NITROGLYCERIN IN D5W 200-5 MCG/ML-% IV SOLN
2.0000 ug/min | INTRAVENOUS | Status: DC
  Filled 2020-06-17: qty 250

## 2020-06-17 MED ORDER — CHLORHEXIDINE GLUCONATE 0.12 % MT SOLN
15.0000 mL | Freq: Once | OROMUCOSAL | Status: AC
  Administered 2020-06-18: 15 mL via OROMUCOSAL
  Filled 2020-06-17: qty 15

## 2020-06-17 MED ORDER — POTASSIUM CHLORIDE CRYS ER 20 MEQ PO TBCR
30.0000 meq | EXTENDED_RELEASE_TABLET | ORAL | Status: AC
  Administered 2020-06-17 (×2): 30 meq via ORAL
  Filled 2020-06-17: qty 1

## 2020-06-17 MED ORDER — TEMAZEPAM 15 MG PO CAPS: 15.0000 mg | ORAL_CAPSULE | Freq: Once | ORAL | Status: DC | PRN

## 2020-06-17 MED ORDER — SODIUM CHLORIDE 0.9 % IV SOLN
INTRAVENOUS | Status: DC
  Filled 2020-06-17: qty 30

## 2020-06-17 NOTE — Progress Notes (Signed)
ANTICOAGULATION CONSULT NOTE  Pharmacy Consult for heparin Indication: chest pain/ACS  Allergies  Allergen Reactions  . Lodine [Etodolac] Nausea Only    Patient Measurements: Weight: 76.8 kg (169 lb 4.8 oz) Heparin Dosing Weight: 78kg  Vital Signs: Temp: 98.4 F (36.9 C) (08/17 2048) Temp Source: Oral (08/17 2048) BP: 105/64 (08/17 2048) Pulse Rate: 68 (08/17 2048)  Labs: Recent Labs    06/17/20 0421 06/17/20 1207 06/17/20 2118  HGB 12.6*  --   --   HCT 38.1*  --   --   PLT 183  --   --   HEPARINUNFRC  --  <0.10* 0.26*  CREATININE 0.88  --   --     Estimated Creatinine Clearance: 96.3 mL/min (by C-G formula based on SCr of 0.88 mg/dL).   Medical History: Past Medical History:  Diagnosis Date  . Chest pain   . Claudication (HCC)   . Mixed hyperlipidemia   . Multiple sclerosis (HCC)      Assessment: Gregory Gardner transferred from OSH for CP now s/p cath with plans for CABG on 8/18  Heparin level 0.26, CABG in am.  Goal of Therapy:  Heparin level 0.3-0.7 units/ml Monitor platelets by anticoagulation protocol: Yes   Plan:  -Increase heparin to 1250 units/h -CABG tomorrow   Fredonia Highland, PharmD, BCPS Clinical Pharmacist [phone] Please check AMION for all [facility] Pharmacy numbers 06/17/2020

## 2020-06-17 NOTE — Plan of Care (Signed)

## 2020-06-17 NOTE — Progress Notes (Signed)
Pre-CABG study completed.   Please see CV Proc for preliminary results.   Cincere Deprey  

## 2020-06-17 NOTE — Progress Notes (Signed)
ANTICOAGULATION CONSULT NOTE  Pharmacy Consult for heparin Indication: chest pain/ACS  Allergies  Allergen Reactions  . Lodine [Etodolac] Nausea Only    Patient Measurements: Weight: 76.8 kg (169 lb 4.8 oz) Heparin Dosing Weight: 78kg  Vital Signs: Temp: 98.3 F (36.8 C) (08/17 1128) Temp Source: Oral (08/17 1128) BP: 116/75 (08/17 1128) Pulse Rate: 67 (08/17 1128)  Labs: Recent Labs    06/17/20 0421 06/17/20 1207  HGB 12.6*  --   HCT 38.1*  --   PLT 183  --   HEPARINUNFRC  --  <0.10*  CREATININE 0.88  --     Estimated Creatinine Clearance: 96.3 mL/min (by C-G formula based on SCr of 0.88 mg/dL).   Medical History: Past Medical History:  Diagnosis Date  . Chest pain   . Claudication (HCC)   . Mixed hyperlipidemia   . Multiple sclerosis (HCC)      Assessment: 47 yoM transferred from OSH for CP now s/p cath with plans for CABG on 8/18 -heparin level undetectable, hg= 12.6  Goal of Therapy:  Heparin level 0.3-0.7 units/ml Monitor platelets by anticoagulation protocol: Yes   Plan:  -Increase heparin to 1150 units/hr -Check 6hr heparin level -Daily heparin level and CBC  Harland German, PharmD Clinical Pharmacist **Pharmacist phone directory can now be found on [email or web address] ([email or web address]).  Listed under [facility].

## 2020-06-17 NOTE — Progress Notes (Signed)
1050-1130 Gave pt [facility] booklet and wrote down how to view pre op video. Pt has had CABG before and stated does not need to watch video. Reviewed sternal precautions and staying in the tube, and the importance of mobility and IS after surgery. Gave pt IS and he can reach 1500 ml correctly. Discussed with pt that surgeon usually wants pt to have someone with them first week home. He stated will consider asking family member. Will follow up after surgery. Luetta Nutting RN BSN 06/17/2020 11:28 AM

## 2020-06-17 NOTE — Progress Notes (Addendum)
Progress Note  Patient Name: Gregory Gardner ("Vol" is pronounced like volleyball - Vol-ih-va) Date of Encounter: 06/17/2020  Primary Cardiologist: Charlton Haws, MD  Subjective   Feeling good without CP or SOB. Wants to buy a lottery ticket, has been playing ever since the lottery came out. Remains on IV NTG and IV heparin although heparin not currently on.  Inpatient Medications    Scheduled Meds: . aspirin  81 mg Oral Daily  . atorvastatin  80 mg Oral QPM  . DULoxetine  30 mg Oral Daily  . ezetimibe  10 mg Oral Daily  . fenofibrate  160 mg Oral Daily  . metoprolol succinate  25 mg Oral Daily  . pantoprazole  80 mg Oral Daily  . sodium chloride flush  3 mL Intravenous Q12H   Continuous Infusions: . sodium chloride    . heparin 950 Units/hr (06/17/20 0815)  . nitroGLYCERIN 100 mcg/min (06/17/20 0723)   PRN Meds: sodium chloride, acetaminophen, albuterol, nitroGLYCERIN, ondansetron (ZOFRAN) IV, sodium chloride flush   Vital Signs    Vitals:   06/16/20 2019 06/17/20 0039 06/17/20 0503 06/17/20 0727  BP: (!) 93/58 119/84 113/66 109/64  Pulse: 72 76 66 71  Resp: 20 19 19 17   Temp: 99.5 F (37.5 C) 98.2 F (36.8 C) 98.3 F (36.8 C) 98.2 F (36.8 C)  TempSrc: Oral Oral Oral Oral  SpO2: 97% 98% 96% 97%  Weight: 78 kg 76.8 kg      Intake/Output Summary (Last 24 hours) at 06/17/2020 0907 Last data filed at 06/17/2020 0346 Gross per 24 hour  Intake 569.29 ml  Output 2025 ml  Net -1455.71 ml   Last 3 Weights 06/17/2020 06/16/2020 11/09/2018  Weight (lbs) 169 lb 4.8 oz 171 lb 14.4 oz 166 lb  Weight (kg) 76.794 kg 77.973 kg 75.297 kg     Telemetry    NSR - Personally Reviewed  Physical Exam   GEN: No acute distress.  HEENT: Normocephalic, atraumatic, sclera non-icteric. Neck: No JVD or bruits. Cardiac: RRR no murmurs, rubs, or gallops.  Radials/DP/PT 1+ and equal bilaterally.  Respiratory: Clear to auscultation bilaterally. Breathing is unlabored. GI: Soft,  nontender, non-distended, BS +x 4. MS: no deformity. Extremities: No clubbing or cyanosis. No edema. Distal pedal pulses are 2+ and equal bilaterally. Right groin cath site without hematoma, ecchymosis, or bruit - Mynx closure dressing in place Neuro:  AAOx3. Follows commands. Psych:  Responds to questions appropriately with a normal affect.  Labs    High Sensitivity Troponin:  No results for input(s): TROPONINIHS in the last 720 hours.    Cardiac EnzymesNo results for input(s): TROPONINI in the last 168 hours. No results for input(s): TROPIPOC in the last 168 hours.   Chemistry Recent Labs  Lab 06/17/20 0421  NA 139  K 3.4*  CL 108  CO2 23  GLUCOSE 130*  BUN 6  CREATININE 0.88  CALCIUM 8.2*  GFRNONAA >60  GFRAA >60  ANIONGAP 8     Hematology Recent Labs  Lab 06/17/20 0421  WBC 9.3  RBC 4.06*  HGB 12.6*  HCT 38.1*  MCV 93.8  MCH 31.0  MCHC 33.1  RDW 13.6  PLT 183    BNPNo results for input(s): BNP, PROBNP in the last 168 hours.   DDimer No results for input(s): DDIMER in the last 168 hours.   Radiology    CARDIAC CATHETERIZATION  Addendum Date: 06/16/2020    Severe disease in the sequential saphenous vein graft to the diagonal and  obtuse marginal. The continuation to the obtuse marginal is totally occluded. Proximal to mid vessel contains shaggy 99% regions of stenosis. The marginal fills by collaterals.  Severe disease in the sequential saphenous vein graft to the PDA and second left ventricular branch with occlusion of the distal anastomosis to the left ventricular branch. The ostial to proximal graft contains 75% stenosis. The insertion site into the PDA is 70% narrowed. The proximal and distal PDA at the insertion site contains high-grade stenosis. Second and third left ventricular branches fill by collaterals.  Patent LIMA to LAD.  Left main is patent  Proximal LAD is totally occluded  Circumflex is ostially occluded  Native RCA is distally occluded   Anterior wall moderate hypokinesis. EF 40 to 50%. LVEDP is normal. RECOMMENDATIONS:  Considering the patient's young age, surgical revascularization would be a better and more sturdy long-term option grafting the diagonal, circumflex, and right coronary territory including the left ventricular branches.  Request consultation from Dr.[name] to determine if patient could be a redo candidate. If not the diagonal graft could be stented as well as the proximal RCA graft but not likely very durable over time.   Result Date: 06/16/2020  Severe disease in the sequential saphenous vein graft to the diagonal and obtuse marginal. The continuation to the obtuse marginal is totally occluded. Proximal to mid vessel contains shaggy 99% regions of stenosis. The marginal fills by collaterals.  Severe disease in the sequential saphenous vein graft to the PDA and second left ventricular branch with occlusion of the distal anastomosis to the left ventricular branch. The ostial to proximal graft contains 75% stenosis. The insertion site into the PDA is 70% narrowed. The proximal and distal PDA at the insertion site contains high-grade stenosis. Second and third left ventricular branches fill by collaterals.  Patent LIMA to LAD.  Left main is patent  Proximal LAD is totally occluded  Circumflex is ostially occluded  Native RCA is distally occluded  Anterior wall moderate hypokinesis. EF 40 to 50%. LVEDP is normal. RECOMMENDATIONS:  Considering the patient's young age, surgical revascularization would be a better and more sturdy long-term option grafting the diagonal, circumflex, and right coronary territory including the left ventricular branches.  Request consultation from Dr.[name] to determine if patient could be a redo candidate. If not the diagonal graft could be stented as well as the proximal RCA graft but not likely very durable over time.   Cardiac Studies   2D Echo 06/17/20  Severe disease in the  sequential saphenous vein graft to the diagonal and obtuse marginal. The continuation to the obtuse marginal is totally occluded. Proximal to mid vessel contains shaggy 99% regions of stenosis. The marginal fills by collaterals.  Severe disease in the sequential saphenous vein graft to the PDA and second left ventricular branch with occlusion of the distal anastomosis to the left ventricular branch. The ostial to proximal graft contains 75% stenosis. The insertion site into the PDA is 70% narrowed. The proximal and distal PDA at the insertion site contains high-grade stenosis. Second and third left ventricular branches fill by collaterals.  Patent LIMA to LAD.  Left main is patent  Proximal LAD is totally occluded  Circumflex is ostially occluded  Native RCA is distally occluded  Anterior wall moderate hypokinesis. EF 40 to 50%. LVEDP is normal.  RECOMMENDATIONS:   Considering the patient's young age, surgical revascularization would be a better and more sturdy long-term option grafting the diagonal, circumflex, and right coronary territory including the left  ventricular branches.  Request consultation from Dr.[name] to determine if patient could be a redo candidate. If not the diagonal graft could be stented as well as the proximal RCA graft but not likely very durable over time.   Patient Profile     59 y.o. male with history of multiple sclerosis, CAD with distant history of MI and CABG 2003, prior tobacco abuse, mixed HLD transferred from [facility] to [facility] with NSTEMI. Cath outlined above with plan for potential redo CABG.  Assessment & Plan    1. CAD/NSTEMI with multivessel disease as above - continue ASA, BB, lipid management below IV NTG, and heparin (pharmD looking into infusion issue)  - pending echo for potential LV dysfunction evaluation - obtain baseline EKG for our records (original one is in shadow chart) - pending CVTS decision (APP note yesterday  awaiting surgeon f/u) - appears to be posted for CABG tomorrow  2. Hyperlipidemia with hx of LFT elevation on statin remotely (Crestor) - Dr. Katrinka Blazing suggested re-trial of atorvastatin 80mg  daily with following LFTs closely as outpatient - pt reports hx of fatty liver which likely contributed in the past - If the patient is tolerating statin at time of follow-up appointment, would consider rechecking liver function at that visit  3. Former tobacco abuse - remains abstinent  4. Multiple sclerosis - gets injections q6 months with last one a week ago  5. Hypokalemia - replete and follow  6. Hyperglycemia - check A1C  For questions or updates, please contact [facility] Please consult [email or web address] for contact info under Cardiology/STEMI.  Signed, , PA-C 06/17/2020, 9:07 AM    Patient seen, examined. Available data reviewed. Agree with findings, assessment, and plan as outlined by 06/19/2020, PA-C.  On exam he is alert, oriented, in no distress.  Lungs are clear, heart is regular rate and rhythm no murmur gallop, abdomen soft nontender, extremities have no edema.  Right groin site is clear.  I removed the dressing from the Mynx closure device.  The patient is chest pain-free on IV nitroglycerin.  He is awaiting redo CABG by Dr. Ronie Spies.  I looked over his medications, all of which are appropriate.  He is hemodynamically stable and otherwise ready for surgery tomorrow.  A 2D echo has been completed this morning and the interpretation is pending.  Pre-CABG Doppler showed patent carotid arteries and triphasic waveforms in both ankles.  Tyrone Sage, M.D. 06/17/2020 12:35 PM

## 2020-06-17 NOTE — Progress Notes (Signed)
°  Echocardiogram 2D Echocardiogram has been performed.  Gregory Gardner 06/17/2020, 10:33 AM

## 2020-06-18 ENCOUNTER — Inpatient Hospital Stay (HOSPITAL_COMMUNITY): Payer: Managed Care, Other (non HMO)

## 2020-06-18 ENCOUNTER — Encounter (HOSPITAL_COMMUNITY): Payer: Self-pay | Admitting: Interventional Cardiology

## 2020-06-18 ENCOUNTER — Encounter (HOSPITAL_COMMUNITY)
Admission: EM | Disposition: A | Discharge status: Home / Self Care | Payer: Self-pay | Source: Other Acute Inpatient Hospital | Attending: Cardiothoracic Surgery

## 2020-06-18 ENCOUNTER — Inpatient Hospital Stay (HOSPITAL_COMMUNITY): Payer: Managed Care, Other (non HMO) | Admitting: Certified Registered Nurse Anesthetist

## 2020-06-18 DIAGNOSIS — I25110 Atherosclerotic heart disease of native coronary artery with unstable angina pectoris: Secondary | ICD-10-CM

## 2020-06-18 DIAGNOSIS — Z951 Presence of aortocoronary bypass graft: Secondary | ICD-10-CM

## 2020-06-18 HISTORY — PX: CORONARY ARTERY BYPASS GRAFT: SHX141

## 2020-06-18 HISTORY — PX: TEE WITHOUT CARDIOVERSION: SHX5443

## 2020-06-18 HISTORY — PX: ENDOVEIN HARVEST OF GREATER SAPHENOUS VEIN: SHX5059

## 2020-06-18 LAB — POCT I-STAT 7, (LYTES, BLD GAS, ICA,H+H)
Acid-base deficit: 3 mmol/L — ABNORMAL HIGH (ref 0.0–2.0)
Acid-base deficit: 4 mmol/L — ABNORMAL HIGH (ref 0.0–2.0)
Acid-base deficit: 4 mmol/L — ABNORMAL HIGH (ref 0.0–2.0)
Bicarbonate: 21.6 mmol/L (ref 20.0–28.0)
Bicarbonate: 22.1 mmol/L (ref 20.0–28.0)
Bicarbonate: 22.8 mmol/L (ref 20.0–28.0)
Calcium, Ion: 1.09 mmol/L — ABNORMAL LOW (ref 1.15–1.40)
Calcium, Ion: 1.1 mmol/L — ABNORMAL LOW (ref 1.15–1.40)
Calcium, Ion: 1.08 mmol/L — ABNORMAL LOW (ref 1.15–1.40)
HCT: 36 % — ABNORMAL LOW (ref 39.0–52.0)
HCT: 37 % — ABNORMAL LOW (ref 39.0–52.0)
HCT: 33 % — ABNORMAL LOW (ref 39.0–52.0)
Hemoglobin: 12.2 g/dL — ABNORMAL LOW (ref 13.0–17.0)
Hemoglobin: 12.6 g/dL — ABNORMAL LOW (ref 13.0–17.0)
Hemoglobin: 11.2 g/dL — ABNORMAL LOW (ref 13.0–17.0)
O2 Saturation: 98 %
O2 Saturation: 98 %
O2 Saturation: 97 %
Patient temperature: 36.3
Patient temperature: 37.5
Patient temperature: 37.5
Potassium: 3.6 mmol/L (ref 3.5–5.1)
Potassium: 4.3 mmol/L (ref 3.5–5.1)
Potassium: 4.4 mmol/L (ref 3.5–5.1)
Sodium: 145 mmol/L (ref 135–145)
Sodium: 143 mmol/L (ref 135–145)
Sodium: 144 mmol/L (ref 135–145)
TCO2: 23 mmol/L (ref 22–32)
TCO2: 23 mmol/L (ref 22–32)
TCO2: 24 mmol/L (ref 22–32)
pCO2 arterial: 34.2 mmHg (ref 32.0–48.0)
pCO2 arterial: 46.4 mmHg (ref 32.0–48.0)
pCO2 arterial: 50.7 mmHg — ABNORMAL HIGH (ref 32.0–48.0)
pH, Arterial: 7.406 (ref 7.350–7.450)
pH, Arterial: 7.289 — ABNORMAL LOW (ref 7.350–7.450)
pH, Arterial: 7.264 — ABNORMAL LOW (ref 7.350–7.450)
pO2, Arterial: 92 mmHg (ref 83.0–108.0)
pO2, Arterial: 128 mmHg — ABNORMAL HIGH (ref 83.0–108.0)
pO2, Arterial: 113 mmHg — ABNORMAL HIGH (ref 83.0–108.0)

## 2020-06-18 LAB — URINALYSIS, ROUTINE W REFLEX MICROSCOPIC
Bilirubin Urine: NEGATIVE
Glucose, UA: NEGATIVE mg/dL
Hgb urine dipstick: NEGATIVE
Ketones, ur: NEGATIVE mg/dL
Leukocytes,Ua: NEGATIVE
Nitrite: NEGATIVE
Protein, ur: NEGATIVE mg/dL
Specific Gravity, Urine: 1.01 (ref 1.005–1.030)
pH: 6 (ref 5.0–8.0)

## 2020-06-18 LAB — BLOOD GAS, ARTERIAL
Acid-base deficit: 0.9 mmol/L (ref 0.0–2.0)
Bicarbonate: 22.6 mmol/L (ref 20.0–28.0)
Drawn by: 55062
FIO2: 21
O2 Saturation: 95.8 %
Patient temperature: 36.7
pCO2 arterial: 33.1 mmHg (ref 32.0–48.0)
pH, Arterial: 7.448 (ref 7.350–7.450)
pO2, Arterial: 102 mmHg (ref 83.0–108.0)

## 2020-06-18 LAB — COMPREHENSIVE METABOLIC PANEL
ALT: 29 U/L (ref 0–44)
AST: 47 U/L — ABNORMAL HIGH (ref 15–41)
Albumin: 3.7 g/dL (ref 3.5–5.0)
Alkaline Phosphatase: 40 U/L (ref 38–126)
Anion gap: 8 (ref 5–15)
BUN: 9 mg/dL (ref 6–20)
CO2: 21 mmol/L — ABNORMAL LOW (ref 22–32)
Calcium: 8.8 mg/dL — ABNORMAL LOW (ref 8.9–10.3)
Chloride: 108 mmol/L (ref 98–111)
Creatinine, Ser: 0.83 mg/dL (ref 0.61–1.24)
GFR calc Af Amer: >60 mL/min (ref >60)
GFR calc non Af Amer: >60 mL/min (ref >60)
Glucose, Bld: 135 mg/dL — ABNORMAL HIGH (ref 70–99)
Potassium: 4.3 mmol/L (ref 3.5–5.1)
Sodium: 137 mmol/L (ref 135–145)
Total Bilirubin: 1.1 mg/dL (ref 0.3–1.2)
Total Protein: 6.6 g/dL (ref 6.5–8.1)

## 2020-06-18 LAB — ECHO INTRAOPERATIVE TEE: Weight: 2708.8 oz

## 2020-06-18 LAB — CBC
HCT: 37.5 % — ABNORMAL LOW (ref 39.0–52.0)
HCT: 35.1 % — ABNORMAL LOW (ref 39.0–52.0)
Hemoglobin: 12.5 g/dL — ABNORMAL LOW (ref 13.0–17.0)
Hemoglobin: 11.4 g/dL — ABNORMAL LOW (ref 13.0–17.0)
MCH: 30.4 pg (ref 26.0–34.0)
MCH: 30.3 pg (ref 26.0–34.0)
MCHC: 33.3 g/dL (ref 30.0–36.0)
MCHC: 32.5 g/dL (ref 30.0–36.0)
MCV: 91.2 fL (ref 80.0–100.0)
MCV: 93.4 fL (ref 80.0–100.0)
Platelets: 90 10*3/uL — ABNORMAL LOW (ref 150–400)
Platelets: 94 10*3/uL — ABNORMAL LOW (ref 150–400)
RBC: 4.11 MIL/uL — ABNORMAL LOW (ref 4.22–5.81)
RBC: 3.76 MIL/uL — ABNORMAL LOW (ref 4.22–5.81)
RDW: 14.8 % (ref 11.5–15.5)
RDW: 15 % (ref 11.5–15.5)
WBC: 10.7 10*3/uL — ABNORMAL HIGH (ref 4.0–10.5)
WBC: 11 10*3/uL — ABNORMAL HIGH (ref 4.0–10.5)
nRBC: 0 % (ref 0.0–0.2)
nRBC: 0 % (ref 0.0–0.2)

## 2020-06-18 LAB — GLUCOSE, CAPILLARY
Glucose-Capillary: 129 mg/dL — ABNORMAL HIGH (ref 70–99)
Glucose-Capillary: 138 mg/dL — ABNORMAL HIGH (ref 70–99)
Glucose-Capillary: 128 mg/dL — ABNORMAL HIGH (ref 70–99)

## 2020-06-18 LAB — BASIC METABOLIC PANEL
Anion gap: 6 (ref 5–15)
BUN: 8 mg/dL (ref 6–20)
CO2: 22 mmol/L (ref 22–32)
Calcium: 7.4 mg/dL — ABNORMAL LOW (ref 8.9–10.3)
Chloride: 111 mmol/L (ref 98–111)
Creatinine, Ser: 0.74 mg/dL (ref 0.61–1.24)
GFR calc Af Amer: >60 mL/min (ref >60)
GFR calc non Af Amer: >60 mL/min (ref >60)
Glucose, Bld: 134 mg/dL — ABNORMAL HIGH (ref 70–99)
Potassium: 4.6 mmol/L (ref 3.5–5.1)
Sodium: 139 mmol/L (ref 135–145)

## 2020-06-18 LAB — PLATELET COUNT: Platelets: 90 10*3/uL — ABNORMAL LOW (ref 150–400)

## 2020-06-18 LAB — HEMOGLOBIN AND HEMATOCRIT, BLOOD
HCT: 29.1 % — ABNORMAL LOW (ref 39.0–52.0)
Hemoglobin: 9.5 g/dL — ABNORMAL LOW (ref 13.0–17.0)

## 2020-06-18 LAB — APTT
aPTT: 34 seconds (ref 24–36)
aPTT: 73 seconds — ABNORMAL HIGH (ref 24–36)

## 2020-06-18 LAB — HEMOGLOBIN A1C
Hgb A1c MFr Bld: 6.5 % — ABNORMAL HIGH (ref 4.8–5.6)
Mean Plasma Glucose: 139.85 mg/dL

## 2020-06-18 LAB — PROTIME-INR
INR: 1.3 — ABNORMAL HIGH (ref 0.8–1.2)
Prothrombin Time: 15.9 seconds — ABNORMAL HIGH (ref 11.4–15.2)

## 2020-06-18 LAB — MAGNESIUM: Magnesium: 3.1 mg/dL — ABNORMAL HIGH (ref 1.7–2.4)

## 2020-06-18 LAB — FIBRINOGEN: Fibrinogen: 322 mg/dL (ref 210–475)

## 2020-06-18 LAB — PREPARE RBC (CROSSMATCH): Order Confirmation: O POS

## 2020-06-18 SURGERY — REDO CORONARY ARTERY BYPASS GRAFTING (CABG)
Anesthesia: General | Site: Leg Lower

## 2020-06-18 MED ORDER — METOPROLOL TARTRATE 12.5 MG HALF TABLET
12.5000 mg | ORAL_TABLET | Freq: Two times a day (BID) | ORAL | Status: DC
  Administered 2020-06-19 (×3): 12.5 mg via ORAL
  Filled 2020-06-18 (×3): qty 1

## 2020-06-18 MED ORDER — ACETAMINOPHEN 160 MG/5ML PO SOLN: 1000.0000 mg | Freq: Four times a day (QID) | ORAL | Status: DC

## 2020-06-18 MED ORDER — FENTANYL CITRATE (PF) 250 MCG/5ML IJ SOLN
INTRAMUSCULAR | Status: DC | PRN
  Administered 2020-06-18: 75 ug via INTRAVENOUS
  Administered 2020-06-18 (×4): 50 ug via INTRAVENOUS
  Administered 2020-06-18: 200 ug via INTRAVENOUS
  Administered 2020-06-18: 75 ug via INTRAVENOUS
  Administered 2020-06-18 (×5): 100 ug via INTRAVENOUS
  Administered 2020-06-18 (×4): 50 ug via INTRAVENOUS

## 2020-06-18 MED ORDER — LACTATED RINGERS IV SOLN: INTRAVENOUS | Status: DC

## 2020-06-18 MED ORDER — HEPARIN SODIUM (PORCINE) 1000 UNIT/ML IJ SOLN
INTRAMUSCULAR | Status: AC
  Filled 2020-06-18: qty 1

## 2020-06-18 MED ORDER — MILRINONE LACTATE IN DEXTROSE 20-5 MG/100ML-% IV SOLN
INTRAVENOUS | Status: DC | PRN
  Administered 2020-06-18: 3815 ug via INTRAVENOUS

## 2020-06-18 MED ORDER — SODIUM CHLORIDE 0.9 % IV SOLN
1.5000 g | Freq: Two times a day (BID) | INTRAVENOUS | Status: DC
  Administered 2020-06-18 – 2020-06-19 (×3): 1.5 g via INTRAVENOUS
  Filled 2020-06-18 (×4): qty 1.5

## 2020-06-18 MED ORDER — ATORVASTATIN CALCIUM 80 MG PO TABS
80.0000 mg | ORAL_TABLET | Freq: Every evening | ORAL | Status: DC
  Administered 2020-06-19 – 2020-06-23 (×5): 80 mg via ORAL
  Filled 2020-06-18 (×5): qty 1

## 2020-06-18 MED ORDER — SODIUM CHLORIDE 0.45 % IV SOLN: INTRAVENOUS | Status: DC | PRN

## 2020-06-18 MED ORDER — PROPOFOL 10 MG/ML IV BOLUS
INTRAVENOUS | Status: AC
  Filled 2020-06-18: qty 20

## 2020-06-18 MED ORDER — 0.9 % SODIUM CHLORIDE (POUR BTL) OPTIME
TOPICAL | Status: DC | PRN
  Administered 2020-06-18: 5000 mL

## 2020-06-18 MED ORDER — FAMOTIDINE IN NACL 20-0.9 MG/50ML-% IV SOLN
INTRAVENOUS | Status: AC
  Administered 2020-06-18: 20 mg via INTRAVENOUS
  Filled 2020-06-18: qty 50

## 2020-06-18 MED ORDER — MIDAZOLAM HCL 5 MG/5ML IJ SOLN
INTRAMUSCULAR | Status: DC | PRN
  Administered 2020-06-18: 2 mg via INTRAVENOUS
  Administered 2020-06-18 (×2): 3 mg via INTRAVENOUS

## 2020-06-18 MED ORDER — DOPAMINE-DEXTROSE 3.2-5 MG/ML-% IV SOLN
INTRAVENOUS | Status: DC | PRN
  Administered 2020-06-18: 3 ug/kg/min via INTRAVENOUS

## 2020-06-18 MED ORDER — SODIUM CHLORIDE 0.9 % IV SOLN: INTRAVENOUS | Status: DC

## 2020-06-18 MED ORDER — CHLORHEXIDINE GLUCONATE 0.12 % MT SOLN
15.0000 mL | OROMUCOSAL | Status: AC
  Administered 2020-06-18: 15 mL via OROMUCOSAL

## 2020-06-18 MED ORDER — MIDAZOLAM HCL (PF) 10 MG/2ML IJ SOLN
INTRAMUSCULAR | Status: AC
  Filled 2020-06-18: qty 2

## 2020-06-18 MED ORDER — PROTAMINE SULFATE 10 MG/ML IV SOLN
INTRAVENOUS | Status: AC
  Filled 2020-06-18: qty 25

## 2020-06-18 MED ORDER — DOPAMINE-DEXTROSE 3.2-5 MG/ML-% IV SOLN
2.0000 ug/kg/min | INTRAVENOUS | Status: DC
  Filled 2020-06-18: qty 250

## 2020-06-18 MED ORDER — ACETAMINOPHEN 650 MG RE SUPP
650.0000 mg | Freq: Once | RECTAL | Status: AC
  Administered 2020-06-18: 650 mg via RECTAL

## 2020-06-18 MED ORDER — ROCURONIUM BROMIDE 10 MG/ML (PF) SYRINGE
PREFILLED_SYRINGE | INTRAVENOUS | Status: DC | PRN
  Administered 2020-06-18 (×2): 50 mg via INTRAVENOUS
  Administered 2020-06-18: 60 mg via INTRAVENOUS
  Administered 2020-06-18: 40 mg via INTRAVENOUS
  Administered 2020-06-18: 50 mg via INTRAVENOUS

## 2020-06-18 MED ORDER — DOCUSATE SODIUM 100 MG PO CAPS
200.0000 mg | ORAL_CAPSULE | Freq: Every day | ORAL | Status: DC
  Administered 2020-06-19: 200 mg via ORAL
  Filled 2020-06-18: qty 2

## 2020-06-18 MED ORDER — LACTATED RINGERS IV SOLN
INTRAVENOUS | Status: DC | PRN
Start: 2020-06-18 — End: 2020-06-18

## 2020-06-18 MED ORDER — BISACODYL 10 MG RE SUPP: 10.0000 mg | Freq: Every day | RECTAL | Status: DC

## 2020-06-18 MED ORDER — FAMOTIDINE IN NACL 20-0.9 MG/50ML-% IV SOLN: 20.0000 mg | Freq: Two times a day (BID) | INTRAVENOUS | Status: DC

## 2020-06-18 MED ORDER — PHENYLEPHRINE 40 MCG/ML (10ML) SYRINGE FOR IV PUSH (FOR BLOOD PRESSURE SUPPORT)
PREFILLED_SYRINGE | INTRAVENOUS | Status: AC
  Filled 2020-06-18: qty 10

## 2020-06-18 MED ORDER — ALBUMIN HUMAN 5 % IV SOLN: INTRAVENOUS | Status: DC | PRN

## 2020-06-18 MED ORDER — CHLORHEXIDINE GLUCONATE CLOTH 2 % EX PADS
6.0000 | MEDICATED_PAD | Freq: Every day | CUTANEOUS | Status: DC
  Administered 2020-06-18 – 2020-06-22 (×5): 6 via TOPICAL

## 2020-06-18 MED ORDER — POTASSIUM CHLORIDE 10 MEQ/50ML IV SOLN
10.0000 meq | INTRAVENOUS | Status: AC
  Administered 2020-06-18 (×3): 10 meq via INTRAVENOUS

## 2020-06-18 MED ORDER — MAGNESIUM SULFATE 4 GM/100ML IV SOLN: 4.0000 g | Freq: Once | INTRAVENOUS | Status: AC

## 2020-06-18 MED ORDER — OXYCODONE HCL 5 MG PO TABS
5.0000 mg | ORAL_TABLET | ORAL | Status: DC | PRN
  Administered 2020-06-19 (×4): 10 mg via ORAL
  Filled 2020-06-18 (×4): qty 2

## 2020-06-18 MED ORDER — ROCURONIUM BROMIDE 10 MG/ML (PF) SYRINGE
PREFILLED_SYRINGE | INTRAVENOUS | Status: AC
  Filled 2020-06-18: qty 30

## 2020-06-18 MED ORDER — PLASMA-LYTE 148 IV SOLN
INTRAVENOUS | Status: DC | PRN
  Administered 2020-06-18: 500 mL

## 2020-06-18 MED ORDER — FENTANYL CITRATE (PF) 250 MCG/5ML IJ SOLN
INTRAMUSCULAR | Status: AC
  Filled 2020-06-18: qty 25

## 2020-06-18 MED ORDER — PHENYLEPHRINE HCL-NACL 20-0.9 MG/250ML-% IV SOLN: 0.0000 ug/min | INTRAVENOUS | Status: DC

## 2020-06-18 MED ORDER — LACTATED RINGERS IV SOLN: 500.0000 mL | Freq: Once | INTRAVENOUS | Status: DC | PRN

## 2020-06-18 MED ORDER — FAMOTIDINE IN NACL 20-0.9 MG/50ML-% IV SOLN
20.0000 mg | Freq: Two times a day (BID) | INTRAVENOUS | Status: AC
  Administered 2020-06-19: 20 mg via INTRAVENOUS
  Filled 2020-06-18: qty 50

## 2020-06-18 MED ORDER — METOPROLOL TARTRATE 25 MG/10 ML ORAL SUSPENSION: 12.5000 mg | Freq: Two times a day (BID) | ORAL | Status: DC

## 2020-06-18 MED ORDER — ACETAMINOPHEN 160 MG/5ML PO SOLN: 650.0000 mg | Freq: Once | ORAL | Status: AC

## 2020-06-18 MED ORDER — SODIUM CHLORIDE 0.9% FLUSH: 10.0000 mL | INTRAVENOUS | Status: DC | PRN

## 2020-06-18 MED ORDER — SODIUM CHLORIDE (PF) 0.9 % IJ SOLN
OROMUCOSAL | Status: DC | PRN
  Administered 2020-06-18 (×3): 4 mL via TOPICAL

## 2020-06-18 MED ORDER — ALBUMIN HUMAN 5 % IV SOLN
250.0000 mL | INTRAVENOUS | Status: AC | PRN
  Administered 2020-06-18 (×2): 12.5 g via INTRAVENOUS
  Filled 2020-06-18: qty 250

## 2020-06-18 MED ORDER — SODIUM CHLORIDE 0.9% FLUSH
10.0000 mL | Freq: Two times a day (BID) | INTRAVENOUS | Status: DC
  Administered 2020-06-18 – 2020-06-19 (×3): 10 mL

## 2020-06-18 MED ORDER — SODIUM CHLORIDE 0.9% FLUSH
3.0000 mL | Freq: Two times a day (BID) | INTRAVENOUS | Status: DC
  Administered 2020-06-19 (×2): 3 mL via INTRAVENOUS

## 2020-06-18 MED ORDER — SODIUM CHLORIDE 0.9 % IV SOLN: 250.0000 mL | INTRAVENOUS | Status: DC

## 2020-06-18 MED ORDER — PROTAMINE SULFATE 10 MG/ML IV SOLN
INTRAVENOUS | Status: DC | PRN
  Administered 2020-06-18: 260 mg via INTRAVENOUS
  Administered 2020-06-18: 10 mg via INTRAVENOUS

## 2020-06-18 MED ORDER — ASPIRIN EC 325 MG PO TBEC
325.0000 mg | DELAYED_RELEASE_TABLET | Freq: Every day | ORAL | Status: DC
  Administered 2020-06-19: 325 mg via ORAL
  Filled 2020-06-18: qty 1

## 2020-06-18 MED ORDER — MORPHINE SULFATE (PF) 2 MG/ML IV SOLN
1.0000 mg | INTRAVENOUS | Status: DC | PRN
  Administered 2020-06-18: 4 mg via INTRAVENOUS
  Administered 2020-06-18 – 2020-06-19 (×2): 2 mg via INTRAVENOUS
  Administered 2020-06-19: 4 mg via INTRAVENOUS
  Administered 2020-06-19: 2 mg via INTRAVENOUS
  Administered 2020-06-19: 4 mg via INTRAVENOUS
  Filled 2020-06-18 (×2): qty 2
  Filled 2020-06-18: qty 1
  Filled 2020-06-18 (×2): qty 2
  Filled 2020-06-18: qty 1

## 2020-06-18 MED ORDER — DEXTROSE 50 % IV SOLN: 0.0000 mL | INTRAVENOUS | Status: DC | PRN

## 2020-06-18 MED ORDER — NITROGLYCERIN IN D5W 200-5 MCG/ML-% IV SOLN
0.0000 ug/min | INTRAVENOUS | Status: DC
  Administered 2020-06-19: 20 ug/min via INTRAVENOUS
  Filled 2020-06-18: qty 250

## 2020-06-18 MED ORDER — ORAL CARE MOUTH RINSE
15.0000 mL | OROMUCOSAL | Status: DC
  Administered 2020-06-18: 15 mL via OROMUCOSAL

## 2020-06-18 MED ORDER — PHENYLEPHRINE HCL (PRESSORS) 10 MG/ML IV SOLN
INTRAVENOUS | Status: DC | PRN
  Administered 2020-06-18: 80 ug via INTRAVENOUS
  Administered 2020-06-18 (×2): 40 ug via INTRAVENOUS

## 2020-06-18 MED ORDER — TRAMADOL HCL 50 MG PO TABS
50.0000 mg | ORAL_TABLET | ORAL | Status: DC | PRN
  Administered 2020-06-19: 100 mg via ORAL
  Filled 2020-06-18: qty 2

## 2020-06-18 MED ORDER — SODIUM CHLORIDE 0.9% FLUSH: 3.0000 mL | INTRAVENOUS | Status: DC | PRN

## 2020-06-18 MED ORDER — HEMOSTATIC AGENTS (NO CHARGE) OPTIME
TOPICAL | Status: DC | PRN
  Administered 2020-06-18 (×3): 1 via TOPICAL

## 2020-06-18 MED ORDER — CHLORHEXIDINE GLUCONATE 0.12% ORAL RINSE (MEDLINE KIT)
15.0000 mL | Freq: Two times a day (BID) | OROMUCOSAL | Status: DC
  Administered 2020-06-18 – 2020-06-19 (×2): 15 mL via OROMUCOSAL

## 2020-06-18 MED ORDER — DULOXETINE HCL 30 MG PO CPEP
30.0000 mg | ORAL_CAPSULE | Freq: Every day | ORAL | Status: DC
  Administered 2020-06-19 – 2020-06-24 (×6): 30 mg via ORAL
  Filled 2020-06-18 (×6): qty 1

## 2020-06-18 MED ORDER — PROPOFOL 10 MG/ML IV BOLUS
INTRAVENOUS | Status: DC | PRN
  Administered 2020-06-18: 60 mg via INTRAVENOUS

## 2020-06-18 MED ORDER — INSULIN REGULAR(HUMAN) IN NACL 100-0.9 UT/100ML-% IV SOLN: INTRAVENOUS | Status: DC

## 2020-06-18 MED ORDER — ASPIRIN 81 MG PO CHEW: 324.0000 mg | CHEWABLE_TABLET | Freq: Every day | ORAL | Status: DC

## 2020-06-18 MED ORDER — ACETAMINOPHEN 500 MG PO TABS
1000.0000 mg | ORAL_TABLET | Freq: Four times a day (QID) | ORAL | Status: DC
  Administered 2020-06-19 – 2020-06-20 (×6): 1000 mg via ORAL
  Filled 2020-06-18 (×6): qty 2

## 2020-06-18 MED ORDER — PANTOPRAZOLE SODIUM 40 MG PO TBEC: 40.0000 mg | DELAYED_RELEASE_TABLET | Freq: Every day | ORAL | Status: DC

## 2020-06-18 MED ORDER — MILRINONE LACTATE IN DEXTROSE 20-5 MG/100ML-% IV SOLN
0.1500 ug/kg/min | INTRAVENOUS | Status: DC
  Administered 2020-06-19: 0.15 ug/kg/min via INTRAVENOUS
  Filled 2020-06-18 (×2): qty 100

## 2020-06-18 MED ORDER — ONDANSETRON HCL 4 MG/2ML IJ SOLN: 4.0000 mg | Freq: Four times a day (QID) | INTRAMUSCULAR | Status: DC | PRN

## 2020-06-18 MED ORDER — DEXMEDETOMIDINE HCL IN NACL 400 MCG/100ML IV SOLN
0.0000 ug/kg/h | INTRAVENOUS | Status: DC
  Administered 2020-06-18: 0.5 ug/kg/h via INTRAVENOUS
  Filled 2020-06-18: qty 100

## 2020-06-18 MED ORDER — FENOFIBRATE 160 MG PO TABS
160.0000 mg | ORAL_TABLET | Freq: Every day | ORAL | Status: DC
  Administered 2020-06-19 – 2020-06-24 (×6): 160 mg via ORAL
  Filled 2020-06-18 (×6): qty 1

## 2020-06-18 MED ORDER — LACTATED RINGERS IV SOLN: INTRAVENOUS | Status: DC | PRN

## 2020-06-18 MED ORDER — MAGNESIUM SULFATE 4 GM/100ML IV SOLN
INTRAVENOUS | Status: AC
  Administered 2020-06-18: 4 g via INTRAVENOUS
  Filled 2020-06-18: qty 100

## 2020-06-18 MED ORDER — HEPARIN SODIUM (PORCINE) 1000 UNIT/ML IJ SOLN
INTRAMUSCULAR | Status: DC | PRN
  Administered 2020-06-18: 27000 [IU] via INTRAVENOUS

## 2020-06-18 MED ORDER — BISACODYL 5 MG PO TBEC
10.0000 mg | DELAYED_RELEASE_TABLET | Freq: Every day | ORAL | Status: DC
  Administered 2020-06-19: 10 mg via ORAL
  Filled 2020-06-18: qty 2

## 2020-06-18 MED ORDER — VANCOMYCIN HCL IN DEXTROSE 1-5 GM/200ML-% IV SOLN
1000.0000 mg | Freq: Once | INTRAVENOUS | Status: AC
  Administered 2020-06-18: 1000 mg via INTRAVENOUS
  Filled 2020-06-18: qty 200

## 2020-06-18 MED ORDER — MIDAZOLAM HCL 2 MG/2ML IJ SOLN: 2.0000 mg | INTRAMUSCULAR | Status: DC | PRN

## 2020-06-18 MED ORDER — METOPROLOL TARTRATE 5 MG/5ML IV SOLN
2.5000 mg | INTRAVENOUS | Status: DC | PRN
  Administered 2020-06-19: 5 mg via INTRAVENOUS
  Filled 2020-06-18: qty 5

## 2020-06-18 SURGICAL SUPPLY — 99 items
ADAPTER CARDIO PERF ANTE/RETRO (ADAPTER) ×2 IMPLANT
ADPR PRFSN 84XANTGRD RTRGD (ADAPTER) ×4
AGENT HMST KT MTR STRL THRMB (HEMOSTASIS) ×4
BAG DECANTER FOR FLEXI CONT (MISCELLANEOUS) ×5 IMPLANT
BLADE CLIPPER SURG (BLADE) ×5 IMPLANT
BLADE CORE FAN STRYKER (BLADE) ×8 IMPLANT
BLADE OSCILLATING /SAGITTAL (BLADE) ×5 IMPLANT
BLADE STERNUM SYSTEM 6 (BLADE) ×5 IMPLANT
BLADE SURG 15 STRL LF DISP TIS (BLADE) ×1 IMPLANT
BLADE SURG 15 STRL SS (BLADE) ×5
BNDG CMPR MED 15X6 ELC VLCR LF (GAUZE/BANDAGES/DRESSINGS) ×4
BNDG ELASTIC 4X5.8 VLCR STR LF (GAUZE/BANDAGES/DRESSINGS) ×5 IMPLANT
BNDG ELASTIC 6X15 VLCR STRL LF (GAUZE/BANDAGES/DRESSINGS) ×2 IMPLANT
BNDG ELASTIC 6X5.8 VLCR STR LF (GAUZE/BANDAGES/DRESSINGS) ×5 IMPLANT
BNDG GAUZE ELAST 4 BULKY (GAUZE/BANDAGES/DRESSINGS) ×5 IMPLANT
CANISTER SUCT 3000ML PPV (MISCELLANEOUS) ×5 IMPLANT
CANN PRFSN .5XCNCT 15X34-48 (MISCELLANEOUS) ×4
CANNULA GUNDRY RCSP 15FR (MISCELLANEOUS) ×2 IMPLANT
CANNULA PRFSN .5XCNCT 15X34-48 (MISCELLANEOUS) ×4 IMPLANT
CANNULA VEN 2 STAGE (MISCELLANEOUS) ×7 IMPLANT
CATH CPB KIT GERHARDT (MISCELLANEOUS) ×5 IMPLANT
CATH RETROPLEGIA CORONARY 14FR (CATHETERS) ×2 IMPLANT
CATH THORACIC 28FR (CATHETERS) ×5 IMPLANT
CLIP RETRACTION 3.0MM CORONARY (MISCELLANEOUS) ×2 IMPLANT
CLIP VESOCCLUDE MED 24/CT (CLIP) ×2 IMPLANT
CONN ST 1/4X3/8  BEN (MISCELLANEOUS) ×5
CONN ST 1/4X3/8 BEN (MISCELLANEOUS) ×1 IMPLANT
DRAIN CHANNEL 28F RND 3/8 FF (WOUND CARE) ×7 IMPLANT
DRAPE CARDIOVASCULAR INCISE (DRAPES) ×5
DRAPE EXTREMITY T 121X128X90 (DISPOSABLE) ×5 IMPLANT
DRAPE SRG 135X102X78XABS (DRAPES) ×4 IMPLANT
DRSG AQUACEL AG ADV 3.5X14 (GAUZE/BANDAGES/DRESSINGS) ×11 IMPLANT
ELECT BLADE 4.0 EZ CLEAN MEGAD (MISCELLANEOUS) ×5
ELECT CAUTERY BLADE 6.4 (BLADE) ×5 IMPLANT
ELECT REM PT RETURN 9FT ADLT (ELECTROSURGICAL) ×10
ELECTRODE BLDE 4.0 EZ CLN MEGD (MISCELLANEOUS) ×4 IMPLANT
ELECTRODE REM PT RTRN 9FT ADLT (ELECTROSURGICAL) ×8 IMPLANT
FELT TEFLON 1X6 (MISCELLANEOUS) ×5 IMPLANT
FILTER SMOKE EVAC ULPA (FILTER) ×5 IMPLANT
GAUZE SPONGE 4X4 12PLY STRL (GAUZE/BANDAGES/DRESSINGS) ×8 IMPLANT
GAUZE SPONGE 4X4 12PLY STRL LF (GAUZE/BANDAGES/DRESSINGS) ×2 IMPLANT
GLOVE BIO SURGEON STRL SZ 6 (GLOVE) ×2 IMPLANT
GLOVE BIO SURGEON STRL SZ 6.5 (GLOVE) ×19 IMPLANT
GLOVE BIO SURGEON STRL SZ7.5 (GLOVE) ×2 IMPLANT
GLOVE BIOGEL PI IND STRL 6 (GLOVE) ×1 IMPLANT
GLOVE BIOGEL PI INDICATOR 6 (GLOVE) ×1
GOWN STRL REUS W/ TWL LRG LVL3 (GOWN DISPOSABLE) ×18 IMPLANT
GOWN STRL REUS W/TWL LRG LVL3 (GOWN DISPOSABLE) ×30
HEMOSTAT POWDER KIT SURGIFOAM (HEMOSTASIS) ×15 IMPLANT
HEMOSTAT SURGICEL 2X14 (HEMOSTASIS) ×2 IMPLANT
INSERT FOGARTY XLG (MISCELLANEOUS) IMPLANT
KIT BASIN OR (CUSTOM PROCEDURE TRAY) ×5 IMPLANT
KIT SUCTION CATH 14FR (SUCTIONS) ×12 IMPLANT
KIT TURNOVER KIT B (KITS) ×5 IMPLANT
KIT VASOVIEW HEMOPRO 2 VH 4000 (KITS) ×5 IMPLANT
LEAD PACING MYOCARDI (MISCELLANEOUS) ×5 IMPLANT
MARKER GRAFT CORONARY BYPASS (MISCELLANEOUS) ×15 IMPLANT
NS IRRIG 1000ML POUR BTL (IV SOLUTION) ×20 IMPLANT
PACK E OPEN HEART (SUTURE) ×5 IMPLANT
PACK OPEN HEART (CUSTOM PROCEDURE TRAY) ×5 IMPLANT
PAD ARMBOARD 7.5X6 YLW CONV (MISCELLANEOUS) ×10 IMPLANT
PAD ELECT DEFIB RADIOL ZOLL (MISCELLANEOUS) ×5 IMPLANT
PENCIL BUTTON HOLSTER BLD 10FT (ELECTRODE) ×5 IMPLANT
PENCIL SMOKE EVACUATOR (MISCELLANEOUS) ×5 IMPLANT
POSITIONER HEAD DONUT 9IN (MISCELLANEOUS) ×5 IMPLANT
SET CARDIOPLEGIA MPS 5001102 (MISCELLANEOUS) ×2 IMPLANT
SHEARS HARMONIC 9CM CVD (BLADE) ×5 IMPLANT
SLEEVE SUCTION 125 (MISCELLANEOUS) ×5 IMPLANT
SOL ANTI FOG 6CC (MISCELLANEOUS) ×4 IMPLANT
SOLUTION ANTI FOG 6CC (MISCELLANEOUS) ×1
SPONGE LAP 18X18 RF (DISPOSABLE) ×8 IMPLANT
SURGIFLO W/THROMBIN 8M KIT (HEMOSTASIS) ×2 IMPLANT
SUT BONE WAX W31G (SUTURE) ×5 IMPLANT
SUT PROLENE 3 0 SH 1 (SUTURE) ×5 IMPLANT
SUT PROLENE 3 0 SH1 36 (SUTURE) ×4 IMPLANT
SUT PROLENE 4 0 RB 1 (SUTURE) ×5
SUT PROLENE 4 0 TF (SUTURE) ×10 IMPLANT
SUT PROLENE 4-0 RB1 .5 CRCL 36 (SUTURE) ×1 IMPLANT
SUT PROLENE 6 0 CC (SUTURE) ×10 IMPLANT
SUT PROLENE 7 0 BV1 MDA (SUTURE) ×7 IMPLANT
SUT PROLENE 8 0 BV175 6 (SUTURE) ×4 IMPLANT
SUT SILK 2 0 SH CR/8 (SUTURE) ×4 IMPLANT
SUT STEEL 6MS V (SUTURE) ×5 IMPLANT
SUT STEEL SZ 6 DBL 3X14 BALL (SUTURE) ×5 IMPLANT
SUT VIC AB 1 CTX 18 (SUTURE) ×10 IMPLANT
SUT VIC AB 2-0 CT1 27 (SUTURE) ×5
SUT VIC AB 2-0 CT1 TAPERPNT 27 (SUTURE) ×1 IMPLANT
SUT VIC AB 3-0 X1 27 (SUTURE) ×2 IMPLANT
SYSTEM SAHARA CHEST DRAIN ATS (WOUND CARE) ×5 IMPLANT
TAPE CLOTH SURG 4X10 WHT LF (GAUZE/BANDAGES/DRESSINGS) ×2 IMPLANT
TAPE PAPER 2X10 WHT MICROPORE (GAUZE/BANDAGES/DRESSINGS) ×2 IMPLANT
TOWEL GREEN STERILE (TOWEL DISPOSABLE) ×5 IMPLANT
TOWEL GREEN STERILE FF (TOWEL DISPOSABLE) ×5 IMPLANT
TRAP FLUID SMOKE EVACUATOR (MISCELLANEOUS) ×5 IMPLANT
TRAY FOLEY SLVR 16FR TEMP STAT (SET/KITS/TRAYS/PACK) ×5 IMPLANT
TUBE FEEDING 8FR 16IN STR KANG (MISCELLANEOUS) ×5 IMPLANT
TUBING LAP HI FLOW INSUFFLATIO (TUBING) ×5 IMPLANT
UNDERPAD 30X36 HEAVY ABSORB (UNDERPADS AND DIAPERS) ×5 IMPLANT
WATER STERILE IRR 1000ML POUR (IV SOLUTION) ×10 IMPLANT

## 2020-06-18 NOTE — Anesthesia Procedure Notes (Signed)
Procedure Name: Intubation Date/Time: 06/18/2020 8:46 AM Performed by: Clearnce Sorrel, CRNA Pre-anesthesia Checklist: Patient identified, Emergency Drugs available, Suction available, Patient being monitored and Timeout performed Patient Re-evaluated:Patient Re-evaluated prior to induction Oxygen Delivery Method: Circle system utilized Preoxygenation: Pre-oxygenation with 100% oxygen Induction Type: IV induction Ventilation: Mask ventilation without difficulty Laryngoscope Size: Mac and 4 Grade View: Grade I Tube type: Oral Tube size: 8.0 mm Number of attempts: 1 Airway Equipment and Method: Stylet Placement Confirmation: ETT inserted through vocal cords under direct vision,  positive ETCO2 and breath sounds checked- equal and bilateral Secured at: 23 cm Tube secured with: Tape Dental Injury: Teeth and Oropharynx as per pre-operative assessment

## 2020-06-18 NOTE — Progress Notes (Signed)
ABG below weaning perimeters.  Pt placed back an full vent support settings.

## 2020-06-18 NOTE — Anesthesia Preprocedure Evaluation (Signed)
Anesthesia Evaluation  Patient identified by MRN, date of birth, ID band Patient awake    Reviewed: Allergy & Precautions, NPO status , Patient's Chart, lab work & pertinent test results  Airway Mallampati: II  TM Distance: >3 FB Neck ROM: Full    Dental  (+) Teeth Intact, Dental Advisory Given   Pulmonary    breath sounds clear to auscultation       Cardiovascular  Rhythm:Regular Rate:Normal     Neuro/Psych    GI/Hepatic   Endo/Other    Renal/GU      Musculoskeletal   Abdominal   Peds  Hematology   Anesthesia Other Findings   Reproductive/Obstetrics                             Anesthesia Physical Anesthesia Plan  ASA: III  Anesthesia Plan: General   Post-op Pain Management:    Induction: Intravenous  PONV Risk Score and Plan: Ondansetron  Airway Management Planned:   Additional Equipment: CVP, PA Cath, Arterial line, 3D TEE and Ultrasound Guidance Line Placement  Intra-op Plan:   Post-operative Plan: Post-operative intubation/ventilation  Informed Consent: I have reviewed the patients History and Physical, chart, labs and discussed the procedure including the risks, benefits and alternatives for the proposed anesthesia with the patient or authorized representative who has indicated his/her understanding and acceptance.     Dental advisory given  Plan Discussed with: CRNA and Anesthesiologist  Anesthesia Plan Comments:         Anesthesia Quick Evaluation

## 2020-06-18 NOTE — Brief Op Note (Signed)
      [address]       [name] [address]             [phone]       06/18/2020  5:44 PM  PATIENT:  Gregory Gardner  59 y.o. male  PRE-OPERATIVE DIAGNOSIS:  Coronary Artery Disease  POST-OPERATIVE DIAGNOSIS:  Coronary Artery Disease  PROCEDURE:  Procedure(s) with comments: REDO CORONARY ARTERY BYPASS GRAFTING (CABG), ON PUMP, TIMES THREE, USING RIGHT INTERNAL MAMMARY ARTERY AND ENDOSCOPICALLY HARVESTED LEFT GREATER SAPHENOUS VEIN (N/A) - SEQENTIAL DIAG TO OM RIMA TO PREVIOUS DISTAL RIGHT VEIN GRAFT TRANSESOPHAGEAL ECHOCARDIOGRAM (TEE) (N/A) ENDOVEIN HARVEST OF GREATER SAPHENOUS VEIN (Left) RIMA-RCA VEIN GRAFT SVG-DIAG-OM  SURGEON:  Surgeon(s) and Role:    * Delight Ovens, MD - Primary  PHYSICIAN ASSISTANT: Gregory GOLD PA-C  ASSISTANTS: STAFF  ANESTHESIA:   general  EBL:  1100 mL   BLOOD ADMINISTERED:2 UNITS PRBC'S  DRAINS: RIGHT PLEURAL AND PERICARDIAL CHEST TUBES   LOCAL MEDICATIONS USED:  NONE  SPECIMEN:  No Specimen  DISPOSITION OF SPECIMEN:  N/A  COUNTS:  YES   DICTATION: .Other Dictation: Dictation Number PENDING  PLAN OF CARE: Admit to inpatient   PATIENT DISPOSITION:  ICU - intubated and hemodynamically stable.   Delay start of Pharmacological VTE agent (>24hrs) due to surgical blood loss or risk of bleeding: yes  COMPLICATIONS: NO KNOWN

## 2020-06-18 NOTE — Anesthesia Procedure Notes (Signed)
Central Venous Catheter Insertion Performed by: Kipp Brood, MD, anesthesiologist Start/End8/18/2021 7:30 AM, 06/18/2020 7:40 AM Patient location: Pre-op. Preanesthetic checklist: patient identified, IV checked, site marked, risks and benefits discussed, surgical consent, monitors and equipment checked, pre-op evaluation, timeout performed and anesthesia consent Hand hygiene performed  and maximum sterile barriers used  PA cath was placed.Swan type:thermodilution Procedure performed without using ultrasound guided technique. Attempts: 1 Following insertion, line sutured, dressing applied and Biopatch. Post procedure assessment: blood return through all ports and free fluid flow  Patient tolerated the procedure well with no immediate complications. Additional procedure comments: 52 cm at insertion site.

## 2020-06-18 NOTE — Anesthesia Procedure Notes (Signed)
Central Venous Catheter Insertion Performed by: Kipp Brood, MD, anesthesiologist Start/End8/18/2021 7:30 AM, 06/18/2020 7:40 AM Patient location: Pre-op. Preanesthetic checklist: patient identified, IV checked, site marked, risks and benefits discussed, surgical consent, monitors and equipment checked, pre-op evaluation, timeout performed and anesthesia consent Lidocaine 1% used for infiltration and patient sedated Hand hygiene performed  and maximum sterile barriers used  Catheter size: 9 Fr Sheath introducer Procedure performed using ultrasound guided technique. Ultrasound Notes:anatomy identified, needle tip was noted to be adjacent to the nerve/plexus identified, no ultrasound evidence of intravascular and/or intraneural injection and image(s) printed for medical record Attempts: 1 Following insertion, line sutured and dressing applied. Post procedure assessment: blood return through all ports, free fluid flow and no air  Patient tolerated the procedure well with no immediate complications.

## 2020-06-18 NOTE — Progress Notes (Signed)
  Echocardiogram Echocardiogram Transesophageal has been performed.  Celene Skeen 06/18/2020, 9:31 AM

## 2020-06-18 NOTE — Anesthesia Procedure Notes (Signed)
Arterial Line Insertion Start/End8/18/2021 8:00 AM, 06/18/2020 8:10 AM Performed by: Dairl Ponder, CRNA  Patient location: Pre-op. Preanesthetic checklist: patient identified, IV checked, site marked, risks and benefits discussed, surgical consent, monitors and equipment checked, pre-op evaluation, timeout performed and anesthesia consent Lidocaine 1% used for infiltration Right, radial was placed Catheter size: 20 Fr Hand hygiene performed  and maximum sterile barriers used   Attempts: 1 Procedure performed without using ultrasound guided technique. Following insertion, dressing applied. Post procedure assessment: normal and unchanged

## 2020-06-18 NOTE — Transfer of Care (Signed)
Immediate Anesthesia Transfer of Care Note  Patient: Gregory Gardner  Procedure(s) Performed: REDO CORONARY ARTERY BYPASS GRAFTING (CABG), ON PUMP, TIMES THREE, USING RIGHT INTERNAL MAMMARY ARTERY AND ENDOSCOPICALLY HARVESTED LEFT GREATER SAPHENOUS VEIN (N/A Chest) TRANSESOPHAGEAL ECHOCARDIOGRAM (TEE) (N/A ) ENDOVEIN HARVEST OF GREATER SAPHENOUS VEIN (Left Leg Lower)  Patient Location: SICU  Anesthesia Type:General  Level of Consciousness: Patient remains intubated per anesthesia plan  Airway & Oxygen Therapy: Patient remains intubated per anesthesia plan  Post-op Assessment: Report given to RN and Post -op Vital signs reviewed and stable  Post vital signs: Reviewed and stable    Last Vitals:  Vitals Value Taken Time  BP 92/62 06/18/20 1658  Temp 36.3 C 06/18/20 1709  Pulse 85 06/18/20 1709  Resp 12 06/18/20 1709  SpO2 96 % 06/18/20 1709  Vitals shown include unvalidated device data.  Last Pain:  Vitals:   06/18/20 0522  TempSrc: Oral  PainSc:          Complications: No complications documented.

## 2020-06-18 NOTE — Plan of Care (Signed)
  Problem: Clinical Measurements: Goal: Will remain free from infection Outcome: Progressing   Problem: Coping: Goal: Level of anxiety will decrease Outcome: Progressing   Problem: Safety: Goal: Ability to remain free from injury will improve Outcome: Progressing   

## 2020-06-18 NOTE — Anesthesia Postprocedure Evaluation (Signed)
Anesthesia Post Note  Patient: Gregory Gardner  Procedure(s) Performed: REDO CORONARY ARTERY BYPASS GRAFTING (CABG), ON PUMP, TIMES THREE, USING RIGHT INTERNAL MAMMARY ARTERY AND ENDOSCOPICALLY HARVESTED LEFT GREATER SAPHENOUS VEIN (N/A Chest) TRANSESOPHAGEAL ECHOCARDIOGRAM (TEE) (N/A ) ENDOVEIN HARVEST OF GREATER SAPHENOUS VEIN (Left Leg Lower)     Patient location during evaluation: SICU Anesthesia Type: General Level of consciousness: sedated and patient remains intubated per anesthesia plan Pain management: pain level controlled Vital Signs Assessment: post-procedure vital signs reviewed and stable Respiratory status: patient remains intubated per anesthesia plan and patient on ventilator - see flowsheet for VS Cardiovascular status: stable Anesthetic complications: no   No complications documented.  Last Vitals:  Vitals:   06/18/20 0522 06/18/20 1657  BP: 106/72   Pulse: (!) 59 84  Resp: 19 12  Temp: 36.7 C   SpO2: 99% 100%    Last Pain:  Vitals:   06/18/20 0522  TempSrc: Oral  PainSc:                  Deontrey Massi COKER

## 2020-06-18 NOTE — Progress Notes (Signed)
      [address]       Gregory Gardner [address]             [phone]    Pre Procedure note for inpatients:   Gregory Gardner has been scheduled for Procedure(s): REDO CORONARY ARTERY BYPASS GRAFTING (CABG) (N/A) possible RADIAL ARTERY HARVEST (Left) TRANSESOPHAGEAL ECHOCARDIOGRAM (TEE) (N/A) today. The various methods of treatment have been discussed with the patient. After consideration of the risks, benefits and treatment options the patient has consented to the planned procedure.   The patient has been seen and labs reviewed. There are no changes in the patient's condition to prevent proceeding with the planned procedure today.  Recent labs:  Lab Results  Component Value Date   WBC 9.3 06/17/2020   HGB 12.6 (L) 06/17/2020   HCT 38.1 (L) 06/17/2020   PLT 183 06/17/2020   GLUCOSE 135 (H) 06/18/2020   CHOL 195 06/17/2020   TRIG 159 (H) 06/17/2020   HDL 44 06/17/2020   LDLCALC 119 (H) 06/17/2020   ALT 29 06/18/2020   AST 47 (H) 06/18/2020   NA 137 06/18/2020   K 4.3 06/18/2020   CL 108 06/18/2020   CREATININE 0.83 06/18/2020   BUN 9 06/18/2020   CO2 21 (L) 06/18/2020   HGBA1C 6.5 (H) 06/18/2020    Delight Ovens, MD 06/18/2020 7:50 AM

## 2020-06-19 ENCOUNTER — Encounter (HOSPITAL_COMMUNITY): Payer: Self-pay | Admitting: Cardiothoracic Surgery

## 2020-06-19 ENCOUNTER — Inpatient Hospital Stay (HOSPITAL_COMMUNITY): Payer: Managed Care, Other (non HMO)

## 2020-06-19 LAB — CBC
HCT: 33.9 % — ABNORMAL LOW (ref 39.0–52.0)
HCT: 33.9 % — ABNORMAL LOW (ref 39.0–52.0)
Hemoglobin: 11.2 g/dL — ABNORMAL LOW (ref 13.0–17.0)
Hemoglobin: 10.9 g/dL — ABNORMAL LOW (ref 13.0–17.0)
MCH: 30.9 pg (ref 26.0–34.0)
MCH: 30.2 pg (ref 26.0–34.0)
MCHC: 33 g/dL (ref 30.0–36.0)
MCHC: 32.2 g/dL (ref 30.0–36.0)
MCV: 93.6 fL (ref 80.0–100.0)
MCV: 93.9 fL (ref 80.0–100.0)
Platelets: 93 10*3/uL — ABNORMAL LOW (ref 150–400)
Platelets: 112 10*3/uL — ABNORMAL LOW (ref 150–400)
RBC: 3.62 MIL/uL — ABNORMAL LOW (ref 4.22–5.81)
RBC: 3.61 MIL/uL — ABNORMAL LOW (ref 4.22–5.81)
RDW: 15 % (ref 11.5–15.5)
RDW: 14.7 % (ref 11.5–15.5)
WBC: 10.3 10*3/uL (ref 4.0–10.5)
WBC: 12.6 10*3/uL — ABNORMAL HIGH (ref 4.0–10.5)
nRBC: 0 % (ref 0.0–0.2)
nRBC: 0 % (ref 0.0–0.2)

## 2020-06-19 LAB — POCT I-STAT 7, (LYTES, BLD GAS, ICA,H+H)
Acid-base deficit: 2 mmol/L (ref 0.0–2.0)
Acid-base deficit: 5 mmol/L — ABNORMAL HIGH (ref 0.0–2.0)
Bicarbonate: 20.3 mmol/L (ref 20.0–28.0)
Bicarbonate: 23.6 mmol/L (ref 20.0–28.0)
Calcium, Ion: 1.06 mmol/L — ABNORMAL LOW (ref 1.15–1.40)
Calcium, Ion: 1.07 mmol/L — ABNORMAL LOW (ref 1.15–1.40)
HCT: 33 % — ABNORMAL LOW (ref 39.0–52.0)
HCT: 33 % — ABNORMAL LOW (ref 39.0–52.0)
Hemoglobin: 11.2 g/dL — ABNORMAL LOW (ref 13.0–17.0)
Hemoglobin: 11.2 g/dL — ABNORMAL LOW (ref 13.0–17.0)
O2 Saturation: 96 %
O2 Saturation: 99 %
Patient temperature: 37.9
Patient temperature: 37.9
Potassium: 4.3 mmol/L (ref 3.5–5.1)
Potassium: 4.4 mmol/L (ref 3.5–5.1)
Sodium: 143 mmol/L (ref 135–145)
Sodium: 143 mmol/L (ref 135–145)
TCO2: 21 mmol/L — ABNORMAL LOW (ref 22–32)
TCO2: 25 mmol/L (ref 22–32)
pCO2 arterial: 37.6 mmHg (ref 32.0–48.0)
pCO2 arterial: 45.1 mmHg (ref 32.0–48.0)
pH, Arterial: 7.33 — ABNORMAL LOW (ref 7.350–7.450)
pH, Arterial: 7.344 — ABNORMAL LOW (ref 7.350–7.450)
pO2, Arterial: 136 mmHg — ABNORMAL HIGH (ref 83.0–108.0)
pO2, Arterial: 94 mmHg (ref 83.0–108.0)

## 2020-06-19 LAB — BASIC METABOLIC PANEL
Anion gap: 6 (ref 5–15)
Anion gap: 8 (ref 5–15)
BUN: 8 mg/dL (ref 6–20)
BUN: 10 mg/dL (ref 6–20)
CO2: 23 mmol/L (ref 22–32)
CO2: 25 mmol/L (ref 22–32)
Calcium: 7.4 mg/dL — ABNORMAL LOW (ref 8.9–10.3)
Calcium: 7.9 mg/dL — ABNORMAL LOW (ref 8.9–10.3)
Chloride: 110 mmol/L (ref 98–111)
Chloride: 100 mmol/L (ref 98–111)
Creatinine, Ser: 0.84 mg/dL (ref 0.61–1.24)
Creatinine, Ser: 0.96 mg/dL (ref 0.61–1.24)
GFR calc Af Amer: >60 mL/min (ref >60)
GFR calc Af Amer: >60 mL/min (ref >60)
GFR calc non Af Amer: >60 mL/min (ref >60)
GFR calc non Af Amer: >60 mL/min (ref >60)
Glucose, Bld: 125 mg/dL — ABNORMAL HIGH (ref 70–99)
Glucose, Bld: 126 mg/dL — ABNORMAL HIGH (ref 70–99)
Potassium: 4.4 mmol/L (ref 3.5–5.1)
Potassium: 4.4 mmol/L (ref 3.5–5.1)
Sodium: 139 mmol/L (ref 135–145)
Sodium: 133 mmol/L — ABNORMAL LOW (ref 135–145)

## 2020-06-19 LAB — GLUCOSE, CAPILLARY
Glucose-Capillary: 102 mg/dL — ABNORMAL HIGH (ref 70–99)
Glucose-Capillary: 121 mg/dL — ABNORMAL HIGH (ref 70–99)
Glucose-Capillary: 123 mg/dL — ABNORMAL HIGH (ref 70–99)
Glucose-Capillary: 125 mg/dL — ABNORMAL HIGH (ref 70–99)
Glucose-Capillary: 127 mg/dL — ABNORMAL HIGH (ref 70–99)
Glucose-Capillary: 131 mg/dL — ABNORMAL HIGH (ref 70–99)
Glucose-Capillary: 132 mg/dL — ABNORMAL HIGH (ref 70–99)
Glucose-Capillary: 146 mg/dL — ABNORMAL HIGH (ref 70–99)
Glucose-Capillary: 79 mg/dL (ref 70–99)

## 2020-06-19 LAB — MAGNESIUM
Magnesium: 2.7 mg/dL — ABNORMAL HIGH (ref 1.7–2.4)
Magnesium: 2.3 mg/dL (ref 1.7–2.4)

## 2020-06-19 MED ORDER — LOSARTAN POTASSIUM 25 MG PO TABS
25.0000 mg | ORAL_TABLET | Freq: Every day | ORAL | Status: DC
  Administered 2020-06-19 – 2020-06-24 (×6): 25 mg via ORAL
  Filled 2020-06-19 (×6): qty 1

## 2020-06-19 MED ORDER — INSULIN ASPART 100 UNIT/ML ~~LOC~~ SOLN
0.0000 [IU] | SUBCUTANEOUS | Status: DC
  Administered 2020-06-19 (×3): 2 [IU] via SUBCUTANEOUS

## 2020-06-19 MED ORDER — ENOXAPARIN SODIUM 40 MG/0.4ML ~~LOC~~ SOLN
40.0000 mg | Freq: Every day | SUBCUTANEOUS | Status: DC
  Administered 2020-06-19 – 2020-06-22 (×4): 40 mg via SUBCUTANEOUS
  Filled 2020-06-19 (×5): qty 0.4

## 2020-06-19 MED ORDER — ALBUTEROL SULFATE (2.5 MG/3ML) 0.083% IN NEBU
2.5000 mg | INHALATION_SOLUTION | RESPIRATORY_TRACT | Status: DC | PRN
  Administered 2020-06-19 – 2020-06-20 (×3): 2.5 mg via RESPIRATORY_TRACT
  Filled 2020-06-19 (×3): qty 3

## 2020-06-19 NOTE — Progress Notes (Signed)
Dopamine drip was stopped per Dr. Tyrone Sage order. Continue to monitor pt closely.

## 2020-06-19 NOTE — Progress Notes (Signed)
Anesthesiology Follow-up:  Awake and alert, neuro intact sitting up in bed, in good spirits, hemodynamically stable.  VS: T-37.3 BP- 130/64 HR- 84 (SR) RR- 15 O2 sat 98% on 4L [place] PA 36/7 CO/CI- 4.6/2.4  K-4.4 glucose-125 BUN/Cr.- 8/0.84 H/H- 11.2/33.9 Platelets- 93,000  CXR: atelectasis vs infiltrate L. Base, lungs otherwise clear  59 year old male peseneted with nonstemi, now one day S/P redo CABG X3. Stable post-op course.  Gregory Gardner

## 2020-06-19 NOTE — Progress Notes (Addendum)
[facility] DAILY ICU PROGRESS NOTE                   [address]            [place] [address]          [phone]   1 Day Post-Op Procedure(s) (LRB): REDO CORONARY ARTERY BYPASS GRAFTING (CABG), ON PUMP, TIMES THREE, USING RIGHT INTERNAL MAMMARY ARTERY AND ENDOSCOPICALLY HARVESTED LEFT GREATER SAPHENOUS VEIN (N/A) TRANSESOPHAGEAL ECHOCARDIOGRAM (TEE) (N/A) ENDOVEIN HARVEST OF GREATER SAPHENOUS VEIN (Left)  Total Length of Stay:  LOS: 3 days   Subjective:  Up in bed.  Doing okay, having some pain, responds to medications.  Denies N/V  Objective: Vital signs in last 24 hours: Temp:  [97.3 F (36.3 C)-100.4 F (38 C)] 99.3 F (37.4 C) (08/19 0700) Pulse Rate:  [72-93] 89 (08/19 0700) Cardiac Rhythm: Normal sinus rhythm (08/19 0000) Resp:  [10-44] 12 (08/19 0700) BP: (92-126)/(62-90) 111/90 (08/19 0700) SpO2:  [91 %-100 %] 98 % (08/19 0700) Arterial Line BP: (102-157)/(49-71) 133/59 (08/19 0700) FiO2 (%):  [40 %-50 %] 40 % (08/19 0000) Weight:  [78.4 kg] 78.4 kg (08/19 0500)  Filed Weights   06/17/20 0039 06/18/20 0522 06/19/20 0500  Weight: 76.8 kg 76.2 kg 78.4 kg    Weight change: 2.132 kg   Hemodynamic parameters for last 24 hours: PAP: (18-43)/(8-21) 30/16 CO:  [3 L/min-5.3 L/min] 4.6 L/min CI:  [1.5 L/min/m2-2.7 L/min/m2] 2.4 L/min/m2  Intake/Output from previous day: 08/18 0701 - 08/19 0700 In: 6245.5 [P.O.:460; I.V.:3883; Blood:571; IV Piggyback:1331.5] Out: 6000 [Urine:4250; Blood:1100; Chest Tube:650]  Current Meds: Scheduled Meds: . acetaminophen  1,000 mg Oral Q6H   Or  . acetaminophen (TYLENOL) oral liquid 160 mg/5 mL  1,000 mg Per Tube Q6H  . aspirin EC  325 mg Oral Daily   Or  . aspirin  324 mg Per Tube Daily  . atorvastatin  80 mg Oral QPM  . bisacodyl  10 mg Oral Daily   Or  . bisacodyl  10 mg Rectal Daily  . chlorhexidine gluconate (MEDLINE KIT)  15 mL Mouth Rinse BID  . Chlorhexidine Gluconate Cloth  6 each Topical Daily  .  docusate sodium  200 mg Oral Daily  . DULoxetine  30 mg Oral Daily  . enoxaparin (LOVENOX) injection  40 mg Subcutaneous QHS  . fenofibrate  160 mg Oral Daily  . insulin aspart  0-24 Units Subcutaneous Q4H  . losartan  25 mg Oral Daily  . metoprolol tartrate  12.5 mg Oral BID   Or  . metoprolol tartrate  12.5 mg Per Tube BID  . mupirocin ointment  1 application Nasal BID  . [START ON 06/20/2020] pantoprazole  40 mg Oral Daily  . sodium chloride flush  10-40 mL Intracatheter Q12H  . sodium chloride flush  3 mL Intravenous Q12H   Continuous Infusions: . sodium chloride    . sodium chloride    . sodium chloride 20 mL/hr at 06/18/20 1652  . albumin human 12.5 g (06/18/20 2154)  . cefUROXime (ZINACEF)  IV Stopped (06/18/20 2314)  . dexmedetomidine (PRECEDEX) IV infusion Stopped (06/19/20 0656)  . DOPamine 3 mcg/kg/min (06/19/20 0700)  . famotidine (PEPCID) IV Stopped (06/18/20 1758)  . insulin 0.8 mL/hr at 06/19/20 0700  . lactated ringers    . lactated ringers    . lactated ringers 20 mL/hr at 06/19/20 0700  . milrinone 0.3 mcg/kg/min (06/19/20 0700)  . nitroGLYCERIN 70 mcg/min (06/19/20 0700)  .  phenylephrine (NEO-SYNEPHRINE) Adult infusion Stopped (06/18/20 2347)   PRN Meds:.sodium chloride, albumin human, dextrose, lactated ringers, metoprolol tartrate, midazolam, morphine injection, ondansetron (ZOFRAN) IV, oxyCODONE, sodium chloride flush, sodium chloride flush, traMADol  General appearance: alert, cooperative and no distress Heart: regular rate and rhythm Lungs: clear to auscultation bilaterally Abdomen: soft, non-tender; bowel sounds normal; no masses,  no organomegaly Extremities: edema trace Wound: clean and dry, aquacel on sternotomy  Lab Results: CBC: Recent Labs    06/18/20 2235 06/19/20 0028 06/19/20 0148 06/19/20 0300  WBC 11.0*  --   --  10.3  HGB 11.4*   < > 11.2* 11.2*  HCT 35.1*   < > 33.0* 33.9*  PLT 94*  --   --  93*   < > = values in this interval  not displayed.   BMET:  Recent Labs    06/18/20 2235 06/19/20 0028 06/19/20 0148 06/19/20 0300  NA 139   < > 143 139  K 4.6   < > 4.4 4.4  CL 111  --   --  110  CO2 22  --   --  23  GLUCOSE 134*  --   --  125*  BUN 8  --   --  8  CREATININE 0.74  --   --  0.84  CALCIUM 7.4*  --   --  7.4*   < > = values in this interval not displayed.    CMET: Lab Results  Component Value Date   WBC 10.3 06/19/2020   HGB 11.2 (L) 06/19/2020   HCT 33.9 (L) 06/19/2020   PLT 93 (L) 06/19/2020   GLUCOSE 125 (H) 06/19/2020   CHOL 195 06/17/2020   TRIG 159 (H) 06/17/2020   HDL 44 06/17/2020   LDLCALC 119 (H) 06/17/2020   ALT 29 06/18/2020   AST 47 (H) 06/18/2020   NA 139 06/19/2020   K 4.4 06/19/2020   CL 110 06/19/2020   CREATININE 0.84 06/19/2020   BUN 8 06/19/2020   CO2 23 06/19/2020   INR 1.3 (H) 06/18/2020   HGBA1C 6.5 (H) 06/18/2020      PT/INR:  Recent Labs    06/18/20 1722  LABPROT 15.9*  INR 1.3*   Radiology: [facility] Chest Port 1 View  Result Date: 06/19/2020 CLINICAL DATA:  Chest tube.  Open-heart surgery. EXAM: PORTABLE CHEST 1 VIEW COMPARISON:  06/18/2020. FINDINGS: Interim extubation. Swan-Ganz catheter, mediastinal drainage catheters, right chest tube in stable position. No pneumothorax. Prior CABG. Heart size stable. Low lung volumes. Prominent infiltrate noted in the left lung base on today's exam. Aspiration cannot be excluded. No pleural effusion or pneumothorax. IMPRESSION: 1. Interim extubation. Remaining lines and tubes including right chest tube in stable position. No pneumothorax. 2.  Prior CABG.  Heart size stable. 3. Low lung volumes. Prominent infiltrate noted the left lung base on today's exam. Aspiration cannot be excluded. Electronically Signed   By: Marcello Moores  Register   On: 06/19/2020 07:03   DG Chest Port 1 View  Result Date: 06/18/2020 CLINICAL DATA:  59 year old male status post CABG. EXAM: PORTABLE CHEST 1 VIEW COMPARISON:  Chest radiograph dated  06/18/2020. FINDINGS: Endotracheal tube with tip approximately 4.7 cm above the carina. Right IJ Swan-Ganz catheter with tip over the mediastinum likely in the proximal right pulmonary artery. Right-sided chest tube with tip along the right upper lobe pleural surface. Inferior accessed catheter is with tips in the upper mediastinum and over the left ventricle. Linear and streaky density extending from the right  hilum to the pleural surface, likely atelectasis. Left infrahilar triangular density, likely atelectasis. Pneumonia is not excluded. Attention on follow-up imaging recommended. Trace right pleural effusion may be present. No pneumothorax. The cardiac silhouette is within limits. Median sternotomy wires and CABG vascular clips. No acute osseous pathology. IMPRESSION: 1. Postsurgical changes of CABG with support lines and tubes as described. No pneumothorax. 2. Right perihilar and left infrahilar atelectasis. Pneumonia is not excluded. Attention on follow-up imaging recommended. Electronically Signed   By: Anner Crete M.D.   On: 06/18/2020 18:16   ECHO INTRAOPERATIVE TEE  Result Date: 06/18/2020  *INTRAOPERATIVE TRANSESOPHAGEAL REPORT *  Patient Name:   Gregory Gardner Date of Exam: 06/18/2020 Medical Rec #:  [id number]        Height:       71.0 in Accession #:    [id number]       Weight:       168.1 lb Date of Birth:  [birth year]        BSA:          1.96 m Patient Age:    6 years         BP:           106/72 mmHg Patient Gender: M                HR:           59 bpm. Exam Location:  Anesthesiology Transesophogeal exam was perform intraoperatively during surgical procedure. Patient was closely monitored under general anesthesia during the entirety of examination. Indications:     CAD                   Re-do CABG Performing Phys: [id number] Ronell Boldin B Aleenah Homen Complications: No known complications during this procedure. PRE-OP FINDINGS  Left Ventricle: The left ventricle has normal systolic function, with an  ejection fraction of 55-60%. The cavity size was normal. There is no increase in left ventricular wall thickness. Right Ventricle: The right ventricle has normal systolic function. The cavity was normal. There is no increase in right ventricular wall thickness. Left Atrium: Left atrial size was normal in size. The left atrial appendage is well visualized and there is no evidence of thrombus present. Right Atrium: Right atrial size was normal in size. Right atrial pressure is estimated at 10 mmHg. Interatrial Septum: No atrial level shunt detected by color flow Doppler. Pericardium: There is no evidence of pericardial effusion. Mitral Valve: The mitral valve is normal in structure. Mild thickening of the mitral valve leaflet. Mitral valve regurgitation is mild by color flow Doppler. The MR jet is centrally-directed. There is No evidence of mitral stenosis. Tricuspid Valve: The tricuspid valve was normal in structure. Tricuspid valve regurgitation is mild-moderate by color flow Doppler. The jet is directed centrally. Aortic Valve: The aortic valve is tricuspid There is mild thickening of the aortic valve Aortic valve regurgitation was not visualized by color flow Doppler. There is no evidence of aortic valve stenosis. Pulmonic Valve: The pulmonic valve was normal in structure, with normal. Pulmonic valve regurgitation is trivial by color flow Doppler. Aorta: The aortic root and proximal ascending aorta were of normal size with a well defined sino-tubular junction without effacement. There was mild intimal thickening with no protruding atheromatous disease. The descending aorta was normal insize with grade 2 and 3 scattered atheromatous disease present.  Roberts Gaudy MD Electronically signed by Roberts Gaudy MD Signature Date/Time: 06/18/2020/9:23:33 PM    Final  Assessment/Plan: S/P Procedure(s) (LRB): REDO CORONARY ARTERY BYPASS GRAFTING (CABG), ON PUMP, TIMES THREE, USING RIGHT INTERNAL MAMMARY ARTERY AND  ENDOSCOPICALLY HARVESTED LEFT GREATER SAPHENOUS VEIN (N/A) TRANSESOPHAGEAL ECHOCARDIOGRAM (TEE) (N/A) ENDOVEIN HARVEST OF GREATER SAPHENOUS VEIN (Left)  1. CV- NSR, + HTN- on NTG and Milrinone- will start low dose Cozaar, Lopressor today, check Co-ox in AM 2. Pulm- wean oxygen as tolerated, CXR w/o effusion, + atelectasis present 3. Renal- creatinine WNL, weight is relatively stable from admission, hold off on Lasix for now 4. Expected post operative blood loss anemia, Hgb at 11.2 5. Mild Thrombocytopenia at 93 6. CBGs- controlled, not a diabetic, stop insulin drip, start SSIP coverage 7. Dispo- patient stable, POD #1, start Cozaar today to hope wean NTG drip, wean Milrinone as able.Marland Kitchen get COOX in AM, possibly d/c chest tubes later today POD #1 progression orders    Ellwood Handler, PA-C   Patient awake and alert  Wean drips as tolerated  D/c mt today I have seen and examined Gregory Gardner and agree with the above assessment  and plan.  Grace Isaac MD Beeper [phone] Office [phone] 06/19/2020 8:35 AM     06/19/2020 7:56 AM

## 2020-06-19 NOTE — Addendum Note (Signed)
Addendum  created 06/19/20 0825 by Sonda Primes, CRNA   Order list changed

## 2020-06-19 NOTE — Progress Notes (Signed)
Cardiology note:   Progress Note  Patient Name: Gregory Gardner Date of Encounter: 06/19/2020  [facility] Cardiologist: Jenkins Rouge, MD   Subjective   Expected soreness, otherwise no complaints.  Sitting up in bed having breakfast.  Inpatient Medications    Scheduled Meds: . acetaminophen  1,000 mg Oral Q6H   Or  . acetaminophen (TYLENOL) oral liquid 160 mg/5 mL  1,000 mg Per Tube Q6H  . aspirin EC  325 mg Oral Daily   Or  . aspirin  324 mg Per Tube Daily  . atorvastatin  80 mg Oral QPM  . bisacodyl  10 mg Oral Daily   Or  . bisacodyl  10 mg Rectal Daily  . chlorhexidine gluconate (MEDLINE KIT)  15 mL Mouth Rinse BID  . Chlorhexidine Gluconate Cloth  6 each Topical Daily  . docusate sodium  200 mg Oral Daily  . DULoxetine  30 mg Oral Daily  . enoxaparin (LOVENOX) injection  40 mg Subcutaneous QHS  . fenofibrate  160 mg Oral Daily  . insulin aspart  0-24 Units Subcutaneous Q4H  . losartan  25 mg Oral Daily  . metoprolol tartrate  12.5 mg Oral BID   Or  . metoprolol tartrate  12.5 mg Per Tube BID  . mupirocin ointment  1 application Nasal BID  . [START ON 06/20/2020] pantoprazole  40 mg Oral Daily  . sodium chloride flush  10-40 mL Intracatheter Q12H  . sodium chloride flush  3 mL Intravenous Q12H   Continuous Infusions: . sodium chloride    . sodium chloride    . sodium chloride 20 mL/hr at 06/18/20 1652  . albumin human 12.5 g (06/18/20 2154)  . cefUROXime (ZINACEF)  IV Stopped (06/18/20 2314)  . dexmedetomidine (PRECEDEX) IV infusion Stopped (06/19/20 0656)  . DOPamine 2 mcg/kg/min (06/19/20 0927)  . famotidine (PEPCID) IV Stopped (06/18/20 1758)  . insulin Stopped (06/19/20 0748)  . lactated ringers    . lactated ringers    . lactated ringers 20 mL/hr at 06/19/20 0900  . milrinone 0.15 mcg/kg/min (06/19/20 0927)  . nitroGLYCERIN 70 mcg/min (06/19/20 0900)  . phenylephrine (NEO-SYNEPHRINE) Adult infusion Stopped (06/18/20 2347)   PRN Meds: sodium  chloride, albumin human, dextrose, lactated ringers, metoprolol tartrate, midazolam, morphine injection, ondansetron (ZOFRAN) IV, oxyCODONE, sodium chloride flush, sodium chloride flush, traMADol   Vital Signs    Vitals:   06/19/20 0600 06/19/20 0700 06/19/20 0800 06/19/20 0900  BP: 105/63 111/90 119/77 111/76  Pulse: 75 89 89 84  Resp: '16 12 13 15  ' Temp: 99.7 F (37.6 C) 99.3 F (37.4 C) 99.1 F (37.3 C)   TempSrc:   Core   SpO2: 98% 98% 100% 96%  Weight:      Height:        Intake/Output Summary (Last 24 hours) at 06/19/2020 0936 Last data filed at 06/19/2020 0900 Gross per 24 hour  Intake 6690.15 ml  Output 6170 ml  Net 520.15 ml   Last 3 Weights 06/19/2020 06/18/2020 06/17/2020  Weight (lbs) 172 lb 12.8 oz 168 lb 1.6 oz 169 lb 4.8 oz  Weight (kg) 78.382 kg 76.25 kg 76.794 kg      Telemetry    Sinus rhythm- Personally Reviewed   Physical Exam  Alert, oriented, no distress GEN: No acute distress.   Neck: No JVD Cardiac: RRR, no murmurs, rubs, or gallops.  Respiratory: Clear to auscultation bilaterally. GI: Soft, nontender, non-distended  MS: No edema; No deformity. Neuro:  Nonfocal  Psych: Normal  affect   Labs    High Sensitivity Troponin:  No results for input(s): TROPONINIHS in the last 720 hours.    Chemistry Recent Labs  Lab 06/18/20 0016 06/18/20 1720 06/18/20 2235 06/18/20 2235 06/19/20 0028 06/19/20 0148 06/19/20 0300  NA 137   < > 139   < > 143 143 139  K 4.3   < > 4.6   < > 4.3 4.4 4.4  CL 108  --  111  --   --   --  110  CO2 21*  --  22  --   --   --  23  GLUCOSE 135*  --  134*  --   --   --  125*  BUN 9  --  8  --   --   --  8  CREATININE 0.83  --  0.74  --   --   --  0.84  CALCIUM 8.8*  --  7.4*  --   --   --  7.4*  PROT 6.6  --   --   --   --   --   --   ALBUMIN 3.7  --   --   --   --   --   --   AST 47*  --   --   --   --   --   --   ALT 29  --   --   --   --   --   --   ALKPHOS 40  --   --   --   --   --   --   BILITOT 1.1  --   --    --   --   --   --   GFRNONAA >60  --  >60  --   --   --  >60  GFRAA >60  --  >60  --   --   --  >60  ANIONGAP 8  --  6  --   --   --  6   < > = values in this interval not displayed.     Hematology Recent Labs  Lab 06/18/20 1722 06/18/20 1927 06/18/20 2235 06/18/20 2235 06/19/20 0028 06/19/20 0148 06/19/20 0300  WBC 10.7*  --  11.0*  --   --   --  10.3  RBC 4.11*  --  3.76*  --   --   --  3.62*  HGB 12.5*   < > 11.4*   < > 11.2* 11.2* 11.2*  HCT 37.5*   < > 35.1*   < > 33.0* 33.0* 33.9*  MCV 91.2  --  93.4  --   --   --  93.6  MCH 30.4  --  30.3  --   --   --  30.9  MCHC 33.3  --  32.5  --   --   --  33.0  RDW 14.8  --  15.0  --   --   --  15.0  PLT 90*  --  94*  --   --   --  93*   < > = values in this interval not displayed.    BNPNo results for input(s): BNP, PROBNP in the last 168 hours.   DDimer No results for input(s): DDIMER in the last 168 hours.   Radiology    DG Chest 2 View  Result Date: 06/18/2020 CLINICAL DATA:  Preoperative EXAM: CHEST - 2 VIEW COMPARISON:  Radiograph  06/16/2020 FINDINGS: Postsurgical changes related to prior CABG including intact and aligned sternotomy wires and multiple surgical clips projecting over the mediastinum. Stable cardiomediastinal contours. No consolidation, features of edema, pneumothorax, or effusion. No acute osseous or soft tissue abnormality. Degenerative changes are present in the imaged spine and shoulders. Telemetry leads overlie the chest. IMPRESSION: No acute cardiopulmonary abnormality. Prior sternotomy and CABG. Electronically Signed   By: Lovena Le M.D.   On: 06/18/2020 05:20   DG Chest Port 1 View  Result Date: 06/19/2020 CLINICAL DATA:  Chest tube.  Open-heart surgery. EXAM: PORTABLE CHEST 1 VIEW COMPARISON:  06/18/2020. FINDINGS: Interim extubation. Swan-Ganz catheter, mediastinal drainage catheters, right chest tube in stable position. No pneumothorax. Prior CABG. Heart size stable. Low lung volumes. Prominent  infiltrate noted in the left lung base on today's exam. Aspiration cannot be excluded. No pleural effusion or pneumothorax. IMPRESSION: 1. Interim extubation. Remaining lines and tubes including right chest tube in stable position. No pneumothorax. 2.  Prior CABG.  Heart size stable. 3. Low lung volumes. Prominent infiltrate noted the left lung base on today's exam. Aspiration cannot be excluded. Electronically Signed   By: Marcello Moores  Register   On: 06/19/2020 07:03   DG Chest Port 1 View  Result Date: 06/18/2020 CLINICAL DATA:  59 year old male status post CABG. EXAM: PORTABLE CHEST 1 VIEW COMPARISON:  Chest radiograph dated 06/18/2020. FINDINGS: Endotracheal tube with tip approximately 4.7 cm above the carina. Right IJ Swan-Ganz catheter with tip over the mediastinum likely in the proximal right pulmonary artery. Right-sided chest tube with tip along the right upper lobe pleural surface. Inferior accessed catheter is with tips in the upper mediastinum and over the left ventricle. Linear and streaky density extending from the right hilum to the pleural surface, likely atelectasis. Left infrahilar triangular density, likely atelectasis. Pneumonia is not excluded. Attention on follow-up imaging recommended. Trace right pleural effusion may be present. No pneumothorax. The cardiac silhouette is within limits. Median sternotomy wires and CABG vascular clips. No acute osseous pathology. IMPRESSION: 1. Postsurgical changes of CABG with support lines and tubes as described. No pneumothorax. 2. Right perihilar and left infrahilar atelectasis. Pneumonia is not excluded. Attention on follow-up imaging recommended. Electronically Signed   By: Anner Crete M.D.   On: 06/18/2020 18:16   ECHOCARDIOGRAM COMPLETE  Result Date: 06/17/2020    ECHOCARDIOGRAM REPORT   Patient Name:   DAIJON WENKE Date of Exam: 06/17/2020 Medical Rec #:  [id number]        Height:       71.0 in Accession #:    [id number]       Weight:        169.3 lb Date of Birth:  October 18, 1961        BSA:          1.964 m Patient Age:    35 years         BP:           109/64 mmHg Patient Gender: M                HR:           71 bpm. Exam Location:  Inpatient Procedure: 2D Echo, Cardiac Doppler and Color Doppler Indications:    CAD  History:        Patient has prior history of Echocardiogram examinations, most                 recent 01/20/2015. CAD, Prior CABG, Signs/Symptoms:Chest Pain;  Risk Factors:Hypertension and Dyslipidemia. Multiple sclerosis.  Sonographer:    Dustin Flock Referring Phys: [place]  1. Left ventricular ejection fraction, by estimation, is 40 to 45%. The left ventricle has mildly decreased function. The left ventricle demonstrates regional wall motion abnormalities (see scoring diagram/findings for description). Anterior/anterolateral hypokinesis. There is moderate asymmetric left ventricular hypertrophy of the basal-septal segment. Left ventricular diastolic parameters are consistent with Grade I diastolic dysfunction (impaired relaxation).  2. Right ventricular systolic function is normal. The right ventricular size is normal. There is normal pulmonary artery systolic pressure. The estimated right ventricular systolic pressure is 09.8 mmHg.  3. The mitral valve is normal in structure. No evidence of mitral valve regurgitation.  4. The aortic valve is tricuspid. Aortic valve regurgitation is not visualized. Mild aortic valve sclerosis is present, with no evidence of aortic valve stenosis.  5. The inferior vena cava is normal in size with greater than 50% respiratory variability, suggesting right atrial pressure of 3 mmHg. FINDINGS  Left Ventricle: Left ventricular ejection fraction, by estimation, is 40 to 45%. The left ventricle has mildly decreased function. The left ventricle demonstrates regional wall motion abnormalities. The left ventricular internal cavity size was normal in size. There is moderate  asymmetric left ventricular hypertrophy of the basal-septal segment. Left ventricular diastolic parameters are consistent with Grade I diastolic dysfunction (impaired relaxation).  LV Wall Scoring: The entire anterior wall, antero-lateral wall, and apical lateral segment are hypokinetic. The entire septum, entire inferior wall, posterior wall, and apex are normal. Right Ventricle: The right ventricular size is normal. No increase in right ventricular wall thickness. Right ventricular systolic function is normal. There is normal pulmonary artery systolic pressure. The tricuspid regurgitant velocity is 2.25 m/s, and  with an assumed right atrial pressure of 3 mmHg, the estimated right ventricular systolic pressure is 11.9 mmHg. Left Atrium: Left atrial size was normal in size. Right Atrium: Right atrial size was normal in size. Pericardium: There is no evidence of pericardial effusion. Mitral Valve: The mitral valve is normal in structure. No evidence of mitral valve regurgitation. Tricuspid Valve: The tricuspid valve is normal in structure. Tricuspid valve regurgitation is trivial. Aortic Valve: The aortic valve is tricuspid. Aortic valve regurgitation is not visualized. Mild aortic valve sclerosis is present, with no evidence of aortic valve stenosis. Pulmonic Valve: The pulmonic valve was not well visualized. Pulmonic valve regurgitation is trivial. Aorta: The aortic root is normal in size and structure. Venous: The inferior vena cava is normal in size with greater than 50% respiratory variability, suggesting right atrial pressure of 3 mmHg. IAS/Shunts: The interatrial septum was not well visualized.  LEFT VENTRICLE PLAX 2D LVIDd:         4.90 cm  Diastology LVIDs:         3.70 cm  LV e' lateral:   8.70 cm/s LV PW:         1.10 cm  LV E/e' lateral: 8.4 LV IVS:        0.90 cm  LV e' medial:    8.59 cm/s LVOT diam:     2.20 cm  LV E/e' medial:  8.5 LV SV:         64 LV SV Index:   33 LVOT Area:     3.80 cm  RIGHT  VENTRICLE RV Basal diam:  3.10 cm RV S prime:     13.50 cm/s TAPSE (M-mode): 2.5 cm LEFT ATRIUM  Index       RIGHT ATRIUM           Index LA diam:        3.60 cm 1.83 cm/m  RA Area:     11.90 cm LA Vol (A2C):   48.4 ml 24.64 ml/m RA Volume:   26.40 ml  13.44 ml/m LA Vol (A4C):   53.8 ml 27.39 ml/m LA Biplane Vol: 51.0 ml 25.96 ml/m  AORTIC VALVE LVOT Vmax:   86.80 cm/s LVOT Vmean:  60.000 cm/s LVOT VTI:    0.169 m  AORTA Ao Root diam: 3.10 cm MITRAL VALVE               TRICUSPID VALVE MV Area (PHT): 4.68 cm    TR Peak grad:   20.2 mmHg MV Decel Time: 162 msec    TR Vmax:        225.00 cm/s MV E velocity: 73.30 cm/s MV A velocity: 82.30 cm/s  SHUNTS MV E/A ratio:  0.89        Systemic VTI:  0.17 m                            Systemic Diam: 2.20 cm Oswaldo Milian MD Electronically signed by Oswaldo Milian MD Signature Date/Time: 06/17/2020/1:14:52 PM    Final    ECHO INTRAOPERATIVE TEE  Result Date: 06/18/2020  *INTRAOPERATIVE TRANSESOPHAGEAL REPORT *  Patient Name:   Aurea Graff Date of Exam: 06/18/2020 Medical Rec #:  [id number]        Height:       71.0 in Accession #:    [id number]       Weight:       168.1 lb Date of Birth:  August 27, 1961        BSA:          1.96 m Patient Age:    54 years         BP:           106/72 mmHg Patient Gender: M                HR:           59 bpm. Exam Location:  Anesthesiology Transesophogeal exam was perform intraoperatively during surgical procedure. Patient was closely monitored under general anesthesia during the entirety of examination. Indications:     CAD                   Re-do CABG Performing Phys: [id number] EDWARD B [name] Complications: No known complications during this procedure. PRE-OP FINDINGS  Left Ventricle: The left ventricle has normal systolic function, with an ejection fraction of 55-60%. The cavity size was normal. There is no increase in left ventricular wall thickness. Right Ventricle: The right ventricle has normal systolic  function. The cavity was normal. There is no increase in right ventricular wall thickness. Left Atrium: Left atrial size was normal in size. The left atrial appendage is well visualized and there is no evidence of thrombus present. Right Atrium: Right atrial size was normal in size. Right atrial pressure is estimated at 10 mmHg. Interatrial Septum: No atrial level shunt detected by color flow Doppler. Pericardium: There is no evidence of pericardial effusion. Mitral Valve: The mitral valve is normal in structure. Mild thickening of the mitral valve leaflet. Mitral valve regurgitation is mild by color flow Doppler. The MR jet is centrally-directed. There is No evidence of mitral stenosis.  Tricuspid Valve: The tricuspid valve was normal in structure. Tricuspid valve regurgitation is mild-moderate by color flow Doppler. The jet is directed centrally. Aortic Valve: The aortic valve is tricuspid There is mild thickening of the aortic valve Aortic valve regurgitation was not visualized by color flow Doppler. There is no evidence of aortic valve stenosis. Pulmonic Valve: The pulmonic valve was normal in structure, with normal. Pulmonic valve regurgitation is trivial by color flow Doppler. Aorta: The aortic root and proximal ascending aorta were of normal size with a well defined sino-tubular junction without effacement. There was mild intimal thickening with no protruding atheromatous disease. The descending aorta was normal insize with grade 2 and 3 scattered atheromatous disease present.  Roberts Gaudy MD Electronically signed by Roberts Gaudy MD Signature Date/Time: 06/18/2020/9:23:33 PM    Final    VAS US DOPPLER PRE CABG  Result Date: 06/17/2020 PREOPERATIVE VASCULAR EVALUATION  Indications:      Redo Pre-CABG. Comparison Study: No prior studies. Performing Technologist: Darlin Coco  Examination Guidelines: A complete evaluation includes B-mode imaging, spectral Doppler, color Doppler, and power Doppler as needed  of all accessible portions of each vessel. Bilateral testing is considered an integral part of a complete examination. Limited examinations for reoccurring indications may be performed as noted.  Right Carotid Findings: +----------+--------+--------+--------+------------+------------------+           PSV cm/sEDV cm/sStenosisDescribe    Comments           +----------+--------+--------+--------+------------+------------------+ CCA Prox  75      14                          intimal thickening +----------+--------+--------+--------+------------+------------------+ CCA Distal90      21                          intimal thickening +----------+--------+--------+--------+------------+------------------+ ICA Prox  70      24      1-39%   heterogenous                   +----------+--------+--------+--------+------------+------------------+ ICA Distal88      26                                             +----------+--------+--------+--------+------------+------------------+ ECA       193             >50%                                   +----------+--------+--------+--------+------------+------------------+ Portions of this table do not appear on this page. +----------+--------+-------+----------------+------------+           PSV cm/sEDV cmsDescribe        Arm Pressure +----------+--------+-------+----------------+------------+ Subclavian86             Multiphasic, WNL             +----------+--------+-------+----------------+------------+ +---------+--------+--+--------+--+---------+ VertebralPSV cm/s52EDV cm/s12Antegrade +---------+--------+--+--------+--+---------+ Left Carotid Findings: +----------+--------+--------+--------+------------+------------------+           PSV cm/sEDV cm/sStenosisDescribe    Comments           +----------+--------+--------+--------+------------+------------------+ CCA Prox  99      11  intimal  thickening +----------+--------+--------+--------+------------+------------------+ CCA Distal105     20                          intimal thickening +----------+--------+--------+--------+------------+------------------+ ICA Prox  77      23      1-39%   heterogenous                   +----------+--------+--------+--------+------------+------------------+ ICA Distal82      27                                             +----------+--------+--------+--------+------------+------------------+ ECA       157                                                    +----------+--------+--------+--------+------------+------------------+ +----------+--------+--------+----------------+------------+ SubclavianPSV cm/sEDV cm/sDescribe        Arm Pressure +----------+--------+--------+----------------+------------+           151             Multiphasic, WNL             +----------+--------+--------+----------------+------------+ +---------+--------+--+--------+--+---------+ VertebralPSV cm/s57EDV cm/s18Antegrade +---------+--------+--+--------+--+---------+  ABI Findings: +--------+------------------+-----+---------+---------------------------+ Right   Rt Pressure (mmHg)IndexWaveform Comment                     +--------+------------------+-----+---------+---------------------------+ Brachial                       triphasicUnable to obtain due to IV. +--------+------------------+-----+---------+---------------------------+ PTA     129               1.15 triphasic                            +--------+------------------+-----+---------+---------------------------+ DP      126               1.12 triphasic                            +--------+------------------+-----+---------+---------------------------+ +--------+------------------+-----+---------+-------+ Left    Lt Pressure (mmHg)IndexWaveform Comment +--------+------------------+-----+---------+-------+  [id number]                    triphasic        +--------+------------------+-----+---------+-------+ PTA     146               1.30 triphasic        +--------+------------------+-----+---------+-------+ DP      130               1.16 triphasic        +--------+------------------+-----+---------+-------+  Right Doppler Findings: +--------+--------+-----+---------+---------------------------+ Site    PressureIndexDoppler  Comments                    +--------+--------+-----+---------+---------------------------+ Brachial             triphasicUnable to obtain due to IV. +--------+--------+-----+---------+---------------------------+ Radial               triphasic                            +--------+--------+-----+---------+---------------------------+  Ulnar                triphasic                            +--------+--------+-----+---------+---------------------------+  Left Doppler Findings: +--------+--------+-----+---------+--------+ Site    PressureIndexDoppler  Comments +--------+--------+-----+---------+--------+ [id number]          triphasic         +--------+--------+-----+---------+--------+ Radial               triphasic         +--------+--------+-----+---------+--------+ Ulnar                triphasic         +--------+--------+-----+---------+--------+  Summary: Right Carotid: Velocities in the right ICA are consistent with a 1-39% stenosis.                The ECA appears >50% stenosed. Left Carotid: Velocities in the left ICA are consistent with a 1-39% stenosis. Vertebrals:  Bilateral vertebral arteries demonstrate antegrade flow. Subclavians: Normal flow hemodynamics were seen in bilateral subclavian              arteries. Right ABI: Resting right ankle-brachial index is within normal range. No evidence of significant right lower extremity arterial disease. The right toe-brachial index is normal. Right Upper Extremity: Doppler  waveforms decrease 50% with right radial compression. Doppler waveforms remain within normal limits with right ulnar compression. Left Upper Extremity: Doppler waveforms decrease >50% with left radial compression. Doppler waveforms remain within normal limits with left ulnar compression.  Electronically signed by Deitra Mayo MD on 06/17/2020 at 12:55:01 PM.    Final      Patient Profile     59 y.o. male with non-STEMI, remote CABG, treated with redo CABG 06/18/2020  Assessment & Plan    The patient is doing very well, postoperative day #1 from CABG.  Currently on low-dose dopamine, milrinone, and IV nitroglycerin.  Heart rhythm is stable.  Management per the surgical team.  Will follow.  For questions or updates, please contact [place] Please consult [email or web address] for contact info under     Signed, Sherren Mocha, MD  06/19/2020, 9:36 AM

## 2020-06-19 NOTE — Hospital Course (Addendum)
Mr. Gregory Gardner is a 59 yo male with history of MS, known CAD S/P CABG x 5 performed in 2003 by Dr. Tyrone Sage, HTN, and Mixed Hyperlipidemia.   He presented to OSH with complaints of chest pain.  The patient states this developed acutely about 30 min prior to presenting to ED.  He was ruled in for ACS/NSTEMI and transferred to [facility] for evaluation.  He was taken to the catheterization lab and found to have multivessel CAD with patent LIMA to LAD, and severe disease in previous saphenous vein grafts.  He had a mildly reduced EF of 40-50%.  It was felt coronary bypass grafting would be indicated.  Currently the patient is chest pain free.  He denied N/V and diaphoresis on presentation.  He states when he had this before it was much worse.  He denies history of smoking.  He does have MS which he receives IV Ocrelizumab every 6 months.  He just received a dose of this the other day.  He is functional and performs his daily activities without difficulty.  He is currently off work on workers compensation due to a back injury, stating he was due to have an MRI tomorrow to evaluate this.   Hospital Course:  He was evaluated by Dr. Tyrone Sage who felt patient would be a candidate for surgery.  However being a redo the procedure would carry a slightly higher risk.  The risks and benefits of the procedure were explained to the patient and he was agreeable to proceed.  He was taken to the operating room on 06/18/2020.  He underwent Redo Sternotomy, CABG x 3 utilizing LIMA, Sequential SVG to Diagonal and OM, and RIMA to distal Right Vein Graft.  He also underwent endoscopic harvest of greater saphenous vein harvest from his left leg.  He tolerated the procedure without difficulty and was taken to the SICU in stable condition.  He was extubated the evening of surgery without difficulty.  During his stay in the SICU the patient was weaned off NTG and Milrinone drips as tolerated.  His chest tubes and arterial lines were  removed without difficulty. His was started on IV lasix, and his potassium was supplemented accordingly.  He was maintaining NSR and was transferred to the progressive care unit on 06/20/2020.  He continued to make good progress.  He remains in NSR and his pacing wires were removed without difficulty.  His blood pressure remains well controlled on Cozaar.  He is ambulating without difficulty.  His incisions are healing without evidence of infection.  He is medically stable for discharge home today.

## 2020-06-19 NOTE — Progress Notes (Deleted)
Dopamine drip was stopped per Dr. Donata Clay order. Continue to monitor the patient closely.

## 2020-06-20 ENCOUNTER — Inpatient Hospital Stay (HOSPITAL_COMMUNITY): Payer: Managed Care, Other (non HMO)

## 2020-06-20 DIAGNOSIS — Z951 Presence of aortocoronary bypass graft: Secondary | ICD-10-CM

## 2020-06-20 LAB — POCT I-STAT, CHEM 8
BUN: 8 mg/dL (ref 6–20)
BUN: 7 mg/dL (ref 6–20)
BUN: 5 mg/dL — ABNORMAL LOW (ref 6–20)
BUN: 7 mg/dL (ref 6–20)
BUN: 7 mg/dL (ref 6–20)
BUN: 7 mg/dL (ref 6–20)
Calcium, Ion: 1.26 mmol/L (ref 1.15–1.40)
Calcium, Ion: 1.22 mmol/L (ref 1.15–1.40)
Calcium, Ion: 0.98 mmol/L — ABNORMAL LOW (ref 1.15–1.40)
Calcium, Ion: 1.02 mmol/L — ABNORMAL LOW (ref 1.15–1.40)
Calcium, Ion: 1.06 mmol/L — ABNORMAL LOW (ref 1.15–1.40)
Calcium, Ion: 1.06 mmol/L — ABNORMAL LOW (ref 1.15–1.40)
Chloride: 105 mmol/L (ref 98–111)
Chloride: 108 mmol/L (ref 98–111)
Chloride: 105 mmol/L (ref 98–111)
Chloride: 108 mmol/L (ref 98–111)
Chloride: 109 mmol/L (ref 98–111)
Chloride: 109 mmol/L (ref 98–111)
Creatinine, Ser: 0.6 mg/dL — ABNORMAL LOW (ref 0.61–1.24)
Creatinine, Ser: 0.6 mg/dL — ABNORMAL LOW (ref 0.61–1.24)
Creatinine, Ser: 0.5 mg/dL — ABNORMAL LOW (ref 0.61–1.24)
Creatinine, Ser: 0.6 mg/dL — ABNORMAL LOW (ref 0.61–1.24)
Creatinine, Ser: 0.5 mg/dL — ABNORMAL LOW (ref 0.61–1.24)
Creatinine, Ser: 0.6 mg/dL — ABNORMAL LOW (ref 0.61–1.24)
Glucose, Bld: 122 mg/dL — ABNORMAL HIGH (ref 70–99)
Glucose, Bld: 122 mg/dL — ABNORMAL HIGH (ref 70–99)
Glucose, Bld: 105 mg/dL — ABNORMAL HIGH (ref 70–99)
Glucose, Bld: 133 mg/dL — ABNORMAL HIGH (ref 70–99)
Glucose, Bld: 140 mg/dL — ABNORMAL HIGH (ref 70–99)
Glucose, Bld: 143 mg/dL — ABNORMAL HIGH (ref 70–99)
HCT: 31 % — ABNORMAL LOW (ref 39.0–52.0)
HCT: 31 % — ABNORMAL LOW (ref 39.0–52.0)
HCT: 24 % — ABNORMAL LOW (ref 39.0–52.0)
HCT: 28 % — ABNORMAL LOW (ref 39.0–52.0)
HCT: 26 % — ABNORMAL LOW (ref 39.0–52.0)
HCT: 25 % — ABNORMAL LOW (ref 39.0–52.0)
Hemoglobin: 10.5 g/dL — ABNORMAL LOW (ref 13.0–17.0)
Hemoglobin: 10.5 g/dL — ABNORMAL LOW (ref 13.0–17.0)
Hemoglobin: 8.2 g/dL — ABNORMAL LOW (ref 13.0–17.0)
Hemoglobin: 9.5 g/dL — ABNORMAL LOW (ref 13.0–17.0)
Hemoglobin: 8.8 g/dL — ABNORMAL LOW (ref 13.0–17.0)
Hemoglobin: 8.5 g/dL — ABNORMAL LOW (ref 13.0–17.0)
Potassium: 4 mmol/L (ref 3.5–5.1)
Potassium: 3.8 mmol/L (ref 3.5–5.1)
Potassium: 4.1 mmol/L (ref 3.5–5.1)
Potassium: 4.7 mmol/L (ref 3.5–5.1)
Potassium: 4.3 mmol/L (ref 3.5–5.1)
Potassium: 3.4 mmol/L — ABNORMAL LOW (ref 3.5–5.1)
Sodium: 141 mmol/L (ref 135–145)
Sodium: 145 mmol/L (ref 135–145)
Sodium: 144 mmol/L (ref 135–145)
Sodium: 142 mmol/L (ref 135–145)
Sodium: 143 mmol/L (ref 135–145)
Sodium: 146 mmol/L — ABNORMAL HIGH (ref 135–145)
TCO2: 27 mmol/L (ref 22–32)
TCO2: 28 mmol/L (ref 22–32)
TCO2: 29 mmol/L (ref 22–32)
TCO2: 25 mmol/L (ref 22–32)
TCO2: 25 mmol/L (ref 22–32)
TCO2: 23 mmol/L (ref 22–32)

## 2020-06-20 LAB — POCT I-STAT 7, (LYTES, BLD GAS, ICA,H+H)
Acid-Base Excess: 0 mmol/L (ref 0.0–2.0)
Acid-Base Excess: 3 mmol/L — ABNORMAL HIGH (ref 0.0–2.0)
Acid-Base Excess: 1 mmol/L (ref 0.0–2.0)
Acid-base deficit: 1 mmol/L (ref 0.0–2.0)
Acid-base deficit: 1 mmol/L (ref 0.0–2.0)
Bicarbonate: 26.4 mmol/L (ref 20.0–28.0)
Bicarbonate: 27.5 mmol/L (ref 20.0–28.0)
Bicarbonate: 24 mmol/L (ref 20.0–28.0)
Bicarbonate: 23.3 mmol/L (ref 20.0–28.0)
Bicarbonate: 23.6 mmol/L (ref 20.0–28.0)
Calcium, Ion: 1.23 mmol/L (ref 1.15–1.40)
Calcium, Ion: 0.97 mmol/L — ABNORMAL LOW (ref 1.15–1.40)
Calcium, Ion: 0.98 mmol/L — ABNORMAL LOW (ref 1.15–1.40)
Calcium, Ion: 1.04 mmol/L — ABNORMAL LOW (ref 1.15–1.40)
Calcium, Ion: 1.04 mmol/L — ABNORMAL LOW (ref 1.15–1.40)
HCT: 36 % — ABNORMAL LOW (ref 39.0–52.0)
HCT: 26 % — ABNORMAL LOW (ref 39.0–52.0)
HCT: 30 % — ABNORMAL LOW (ref 39.0–52.0)
HCT: 30 % — ABNORMAL LOW (ref 39.0–52.0)
HCT: 26 % — ABNORMAL LOW (ref 39.0–52.0)
Hemoglobin: 12.2 g/dL — ABNORMAL LOW (ref 13.0–17.0)
Hemoglobin: 8.8 g/dL — ABNORMAL LOW (ref 13.0–17.0)
Hemoglobin: 10.2 g/dL — ABNORMAL LOW (ref 13.0–17.0)
Hemoglobin: 10.2 g/dL — ABNORMAL LOW (ref 13.0–17.0)
Hemoglobin: 8.8 g/dL — ABNORMAL LOW (ref 13.0–17.0)
O2 Saturation: 100 %
O2 Saturation: 100 %
O2 Saturation: 100 %
O2 Saturation: 100 %
O2 Saturation: 100 %
Potassium: 4.2 mmol/L (ref 3.5–5.1)
Potassium: 4.2 mmol/L (ref 3.5–5.1)
Potassium: 4.7 mmol/L (ref 3.5–5.1)
Potassium: 4.4 mmol/L (ref 3.5–5.1)
Potassium: 3.4 mmol/L — ABNORMAL LOW (ref 3.5–5.1)
Sodium: 141 mmol/L (ref 135–145)
Sodium: 144 mmol/L (ref 135–145)
Sodium: 142 mmol/L (ref 135–145)
Sodium: 144 mmol/L (ref 135–145)
Sodium: 146 mmol/L — ABNORMAL HIGH (ref 135–145)
TCO2: 28 mmol/L (ref 22–32)
TCO2: 29 mmol/L (ref 22–32)
TCO2: 25 mmol/L (ref 22–32)
TCO2: 24 mmol/L (ref 22–32)
TCO2: 25 mmol/L (ref 22–32)
pCO2 arterial: 48.3 mmHg — ABNORMAL HIGH (ref 32.0–48.0)
pCO2 arterial: 41.5 mmHg (ref 32.0–48.0)
pCO2 arterial: 33.2 mmHg (ref 32.0–48.0)
pCO2 arterial: 35.4 mmHg (ref 32.0–48.0)
pCO2 arterial: 37.2 mmHg (ref 32.0–48.0)
pH, Arterial: 7.346 — ABNORMAL LOW (ref 7.350–7.450)
pH, Arterial: 7.43 (ref 7.350–7.450)
pH, Arterial: 7.468 — ABNORMAL HIGH (ref 7.350–7.450)
pH, Arterial: 7.426 (ref 7.350–7.450)
pH, Arterial: 7.411 (ref 7.350–7.450)
pO2, Arterial: 492 mmHg — ABNORMAL HIGH (ref 83.0–108.0)
pO2, Arterial: 401 mmHg — ABNORMAL HIGH (ref 83.0–108.0)
pO2, Arterial: 385 mmHg — ABNORMAL HIGH (ref 83.0–108.0)
pO2, Arterial: 266 mmHg — ABNORMAL HIGH (ref 83.0–108.0)
pO2, Arterial: 371 mmHg — ABNORMAL HIGH (ref 83.0–108.0)

## 2020-06-20 LAB — CBC
HCT: 32.8 % — ABNORMAL LOW (ref 39.0–52.0)
Hemoglobin: 10.7 g/dL — ABNORMAL LOW (ref 13.0–17.0)
MCH: 30.7 pg (ref 26.0–34.0)
MCHC: 32.6 g/dL (ref 30.0–36.0)
MCV: 94 fL (ref 80.0–100.0)
Platelets: 98 10*3/uL — ABNORMAL LOW (ref 150–400)
RBC: 3.49 MIL/uL — ABNORMAL LOW (ref 4.22–5.81)
RDW: 14.6 % (ref 11.5–15.5)
WBC: 13.1 10*3/uL — ABNORMAL HIGH (ref 4.0–10.5)
nRBC: 0 % (ref 0.0–0.2)

## 2020-06-20 LAB — COMPREHENSIVE METABOLIC PANEL
ALT: 29 U/L (ref 0–44)
AST: 44 U/L — ABNORMAL HIGH (ref 15–41)
Albumin: 3.3 g/dL — ABNORMAL LOW (ref 3.5–5.0)
Alkaline Phosphatase: 50 U/L (ref 38–126)
Anion gap: 5 (ref 5–15)
BUN: 11 mg/dL (ref 6–20)
CO2: 25 mmol/L (ref 22–32)
Calcium: 7.8 mg/dL — ABNORMAL LOW (ref 8.9–10.3)
Chloride: 101 mmol/L (ref 98–111)
Creatinine, Ser: 0.84 mg/dL (ref 0.61–1.24)
GFR calc Af Amer: >60 mL/min (ref >60)
GFR calc non Af Amer: >60 mL/min (ref >60)
Glucose, Bld: 126 mg/dL — ABNORMAL HIGH (ref 70–99)
Potassium: 3.8 mmol/L (ref 3.5–5.1)
Sodium: 131 mmol/L — ABNORMAL LOW (ref 135–145)
Total Bilirubin: 2.3 mg/dL — ABNORMAL HIGH (ref 0.3–1.2)
Total Protein: 5.5 g/dL — ABNORMAL LOW (ref 6.5–8.1)

## 2020-06-20 LAB — COOXEMETRY PANEL
Carboxyhemoglobin: 1.4 % (ref 0.5–1.5)
Methemoglobin: 1.5 % (ref 0.0–1.5)
O2 Saturation: 63.1 %
Total hemoglobin: 11.3 g/dL — ABNORMAL LOW (ref 12.0–16.0)

## 2020-06-20 LAB — GLUCOSE, CAPILLARY
Glucose-Capillary: 103 mg/dL — ABNORMAL HIGH (ref 70–99)
Glucose-Capillary: 104 mg/dL — ABNORMAL HIGH (ref 70–99)
Glucose-Capillary: 140 mg/dL — ABNORMAL HIGH (ref 70–99)
Glucose-Capillary: 150 mg/dL — ABNORMAL HIGH (ref 70–99)
Glucose-Capillary: 106 mg/dL — ABNORMAL HIGH (ref 70–99)
Glucose-Capillary: 123 mg/dL — ABNORMAL HIGH (ref 70–99)

## 2020-06-20 MED ORDER — SODIUM CHLORIDE 0.9 % IV SOLN: 250.0000 mL | INTRAVENOUS | Status: DC | PRN

## 2020-06-20 MED ORDER — PANTOPRAZOLE SODIUM 40 MG PO TBEC
40.0000 mg | DELAYED_RELEASE_TABLET | Freq: Every day | ORAL | Status: DC
  Administered 2020-06-20 – 2020-06-24 (×5): 40 mg via ORAL
  Filled 2020-06-20 (×5): qty 1

## 2020-06-20 MED ORDER — ONDANSETRON HCL 4 MG PO TABS: 4.0000 mg | ORAL_TABLET | Freq: Four times a day (QID) | ORAL | Status: DC | PRN

## 2020-06-20 MED ORDER — BISACODYL 10 MG RE SUPP: 10.0000 mg | Freq: Every day | RECTAL | Status: DC | PRN

## 2020-06-20 MED ORDER — ASPIRIN EC 325 MG PO TBEC
325.0000 mg | DELAYED_RELEASE_TABLET | Freq: Every day | ORAL | Status: DC
  Administered 2020-06-20 – 2020-06-24 (×5): 325 mg via ORAL
  Filled 2020-06-20 (×5): qty 1

## 2020-06-20 MED ORDER — LEVALBUTEROL HCL 0.63 MG/3ML IN NEBU
0.6300 mg | INHALATION_SOLUTION | Freq: Four times a day (QID) | RESPIRATORY_TRACT | Status: DC
  Administered 2020-06-20 – 2020-06-21 (×5): 0.63 mg via RESPIRATORY_TRACT
  Filled 2020-06-20 (×4): qty 3

## 2020-06-20 MED ORDER — ONDANSETRON HCL 4 MG/2ML IJ SOLN: 4.0000 mg | Freq: Four times a day (QID) | INTRAMUSCULAR | Status: DC | PRN

## 2020-06-20 MED ORDER — SODIUM CHLORIDE 0.9% FLUSH: 3.0000 mL | INTRAVENOUS | Status: DC | PRN

## 2020-06-20 MED ORDER — FUROSEMIDE 10 MG/ML IJ SOLN
40.0000 mg | Freq: Once | INTRAMUSCULAR | Status: AC
  Administered 2020-06-20: 40 mg via INTRAVENOUS
  Filled 2020-06-20: qty 4

## 2020-06-20 MED ORDER — GUAIFENESIN ER 600 MG PO TB12: 600.0000 mg | ORAL_TABLET | Freq: Two times a day (BID) | ORAL | Status: DC | PRN

## 2020-06-20 MED ORDER — DOCUSATE SODIUM 100 MG PO CAPS
200.0000 mg | ORAL_CAPSULE | Freq: Every day | ORAL | Status: DC
  Administered 2020-06-20 – 2020-06-21 (×2): 200 mg via ORAL
  Filled 2020-06-20 (×5): qty 2

## 2020-06-20 MED ORDER — METOPROLOL TARTRATE 12.5 MG HALF TABLET
12.5000 mg | ORAL_TABLET | Freq: Two times a day (BID) | ORAL | Status: DC
  Administered 2020-06-20 – 2020-06-22 (×6): 12.5 mg via ORAL
  Filled 2020-06-20 (×6): qty 1

## 2020-06-20 MED ORDER — SODIUM CHLORIDE 0.9% FLUSH
3.0000 mL | Freq: Two times a day (BID) | INTRAVENOUS | Status: DC
  Administered 2020-06-20 – 2020-06-23 (×7): 3 mL via INTRAVENOUS

## 2020-06-20 MED ORDER — INSULIN ASPART 100 UNIT/ML ~~LOC~~ SOLN
0.0000 [IU] | Freq: Three times a day (TID) | SUBCUTANEOUS | Status: DC
  Administered 2020-06-20 – 2020-06-21 (×3): 2 [IU] via SUBCUTANEOUS

## 2020-06-20 MED ORDER — OXYCODONE HCL 5 MG PO TABS
5.0000 mg | ORAL_TABLET | ORAL | Status: DC | PRN
  Administered 2020-06-20 – 2020-06-23 (×4): 10 mg via ORAL
  Filled 2020-06-20 (×4): qty 2

## 2020-06-20 MED ORDER — POTASSIUM CHLORIDE CRYS ER 10 MEQ PO TBCR
10.0000 meq | EXTENDED_RELEASE_TABLET | Freq: Every day | ORAL | Status: AC
  Administered 2020-06-21 – 2020-06-23 (×3): 10 meq via ORAL
  Filled 2020-06-20 (×3): qty 1

## 2020-06-20 MED ORDER — TRAMADOL HCL 50 MG PO TABS: 50.0000 mg | ORAL_TABLET | ORAL | Status: DC | PRN

## 2020-06-20 MED ORDER — POTASSIUM CHLORIDE CRYS ER 20 MEQ PO TBCR
20.0000 meq | EXTENDED_RELEASE_TABLET | ORAL | Status: DC
  Administered 2020-06-20: 20 meq via ORAL
  Filled 2020-06-20: qty 1

## 2020-06-20 MED ORDER — BISACODYL 5 MG PO TBEC: 10.0000 mg | DELAYED_RELEASE_TABLET | Freq: Every day | ORAL | Status: DC | PRN

## 2020-06-20 MED ORDER — ACETAMINOPHEN 325 MG PO TABS
650.0000 mg | ORAL_TABLET | Freq: Four times a day (QID) | ORAL | Status: DC | PRN
  Administered 2020-06-21 – 2020-06-22 (×3): 650 mg via ORAL
  Filled 2020-06-20 (×5): qty 2

## 2020-06-20 MED ORDER — FUROSEMIDE 40 MG PO TABS
40.0000 mg | ORAL_TABLET | Freq: Every day | ORAL | Status: AC
  Administered 2020-06-21 – 2020-06-23 (×3): 40 mg via ORAL
  Filled 2020-06-20 (×3): qty 1

## 2020-06-20 MED ORDER — ~~LOC~~ CARDIAC SURGERY, PATIENT & FAMILY EDUCATION
Freq: Once | Status: AC
  Administered 2020-06-20: 1

## 2020-06-20 MED ORDER — ALBUTEROL SULFATE HFA 108 (90 BASE) MCG/ACT IN AERS
2.0000 | INHALATION_SPRAY | Freq: Four times a day (QID) | RESPIRATORY_TRACT | Status: DC | PRN
  Administered 2020-06-20 – 2020-06-21 (×2): 2 via RESPIRATORY_TRACT
  Filled 2020-06-20: qty 6.7

## 2020-06-20 MED ORDER — METOCLOPRAMIDE HCL 5 MG/ML IJ SOLN: 10.0000 mg | Freq: Four times a day (QID) | INTRAMUSCULAR | Status: DC

## 2020-06-20 MED FILL — Magnesium Sulfate Inj 50%: INTRAMUSCULAR | Qty: 10 | Status: AC

## 2020-06-20 MED FILL — Sodium Chloride IV Soln 0.9%: INTRAVENOUS | Qty: 2000 | Status: AC

## 2020-06-20 MED FILL — Heparin Sodium (Porcine) Inj 1000 Unit/ML: INTRAMUSCULAR | Qty: 30 | Status: AC

## 2020-06-20 MED FILL — Electrolyte-R (PH 7.4) Solution: INTRAVENOUS | Qty: 3000 | Status: AC

## 2020-06-20 MED FILL — Potassium Chloride Inj 2 mEq/ML: INTRAVENOUS | Qty: 40 | Status: AC

## 2020-06-20 MED FILL — Albumin, Human Inj 5%: INTRAVENOUS | Qty: 500 | Status: AC

## 2020-06-20 NOTE — Progress Notes (Signed)
Chaplain engaged in initial visit with Gregory Gardner who is thankful to be alive.  He noted that sitting up in the chair feels better for him than laying down in the bed.  He is able to rest more sitting up.  Chaplain offered support and the ministries of presence and listening.  Chaplain will follow-up as needed.

## 2020-06-20 NOTE — Progress Notes (Signed)
Mobility Specialist: Progress Note   06/20/20 1748  Mobility  Activity Ambulated to bathroom  Level of Assistance Independent  Assistive Device None  Distance Ambulated (ft) 10 ft  Mobility Response Tolerated well  Mobility performed by Mobility specialist  $Mobility charge 1 Mobility   Assisted pt to bathroom upon entering room. After returning to pt's room he was eating dinner. I encouraged pt to walk this evening. Will f/u w/ pt tomorrow.  [facility] Amilee Janvier Mobility Specialist

## 2020-06-20 NOTE — Progress Notes (Signed)
CARDIAC REHAB PHASE I   PRE:  Rate/Rhythm: 73 SR  BP:  Sitting: 118/90   SaO2: 95 RA  MODE:  Ambulation: 110 ft   POST:  Rate/Rhythm: 89 SR  BP:  Sitting: 120/92    SaO2: 93 RA  Pt helped to BR, upon return from unsuccessful BR trip, pt requesting nebulizer. Administered by RN, pt then requesting time for it to work. After some encouragement, pt ambulated 138ft with EVA. Pt returned to recliner, and requesting another tx as the nebulizer did not work. Pt states better results with inhaler. Spoke with pharmacy, pharmacy changing to inhaler at pts request. Encouraged continued ambulation and IS use. Will continue to follow.   [id number] Reynold Bowen, RN BSN 06/20/2020 3:04 PM

## 2020-06-20 NOTE — Progress Notes (Signed)
CARDIAC REHAB PHASE I   Offered to walk with pt. Pt requesting time for his lunch to settle. Will return as time allows. Pt given new IS and able to demonstrate 750. Encouraged continued use.  Reynold Bowen, RN BSN 06/20/2020 1:19 PM

## 2020-06-20 NOTE — Progress Notes (Signed)
TCTS  Patient examined and record reviewed.Hemodynamics stable,labs satisfactory.Patient had stable day.Continue current care.  Blood pressure 117/71, pulse 66, temperature 99.3 F (37.4 C), temperature source Oral, resp. rate 13, height 5\' 11"  (1.803 m), weight 78.4 kg, SpO2 99 %.   Trigt III 06/20/2020

## 2020-06-20 NOTE — Progress Notes (Signed)
RT consult note:  Pt takes albuterol inhaler as needed at home for wheezing. Has had an increased need of PRN treatments as of yesterday. Chest x-ray shows left basilar atelectasis. Pt has a strong, non-productive cough and is currently on 3L [place], baseline is room air. Pt now has order for Q6 xopenex nebulizer treatments. RT protocol assessment says that pt scores a three and does not need them that often, however due to higher demand of PRNs will keep Xopenex treatments for now. RT will encourage good bronchial hygiene such as, incentive spirometry with cough and deep breathing exercises. RT will continue to monitor.

## 2020-06-20 NOTE — Progress Notes (Signed)
Patient ID: Gregory Gardner, male   DOB: 1960-11-13, 59 y.o.   MRN: [id number] TCTS DAILY ICU PROGRESS NOTE                   [address]            [place] [address]          [phone]   2 Days Post-Op Procedure(s) (LRB): REDO CORONARY ARTERY BYPASS GRAFTING (CABG), ON PUMP, TIMES THREE, USING RIGHT INTERNAL MAMMARY ARTERY AND ENDOSCOPICALLY HARVESTED LEFT GREATER SAPHENOUS VEIN (N/A) TRANSESOPHAGEAL ECHOCARDIOGRAM (TEE) (N/A) ENDOVEIN HARVEST OF GREATER SAPHENOUS VEIN (Left)  Total Length of Stay:  LOS: 4 days   Subjective: Patient awake and alert , neuro intact    Objective: Vital signs in last 24 hours: Temp:  [98.5 F (36.9 C)-100.3 F (37.9 C)] 99.8 F (37.7 C) (08/20 0400) Pulse Rate:  [66-89] 76 (08/20 0600) Cardiac Rhythm: Normal sinus rhythm (08/20 0000) Resp:  [11-27] 15 (08/20 0600) BP: (100-145)/(62-89) 118/85 (08/20 0600) SpO2:  [93 %-100 %] 97 % (08/20 0600) Arterial Line BP: (130-145)/(49-73) 133/66 (08/19 1100) Weight:  [81.1 kg] 81.1 kg (08/20 0500)  Filed Weights   06/18/20 0522 06/19/20 0500 06/20/20 0500  Weight: 76.2 kg 78.4 kg 81.1 kg    Weight change: 2.718 kg   Hemodynamic parameters for last 24 hours: PAP: (36)/(7) 36/7  Intake/Output from previous day: 08/19 0701 - 08/20 0700 In: 1325.7 [P.O.:580; I.V.:551.5; IV Piggyback:194.2] Out: 2330 [Urine:1970; Chest Tube:360]  Intake/Output this shift: No intake/output data recorded.  Current Meds: Scheduled Meds:  acetaminophen  1,000 mg Oral Q6H   Or   acetaminophen (TYLENOL) oral liquid 160 mg/5 mL  1,000 mg Per Tube Q6H   aspirin EC  325 mg Oral Daily   Or   aspirin  324 mg Per Tube Daily   atorvastatin  80 mg Oral QPM   bisacodyl  10 mg Oral Daily   Or   bisacodyl  10 mg Rectal Daily   chlorhexidine gluconate (MEDLINE KIT)  15 mL Mouth Rinse BID   Chlorhexidine Gluconate Cloth  6 each Topical Daily   docusate sodium  200 mg Oral Daily   DULoxetine  30 mg  Oral Daily   enoxaparin (LOVENOX) injection  40 mg Subcutaneous QHS   fenofibrate  160 mg Oral Daily   insulin aspart  0-24 Units Subcutaneous Q4H   losartan  25 mg Oral Daily   metoprolol tartrate  12.5 mg Oral BID   Or   metoprolol tartrate  12.5 mg Per Tube BID   mupirocin ointment  1 application Nasal BID   pantoprazole  40 mg Oral Daily   potassium chloride  20 mEq Oral Q4H   sodium chloride flush  10-40 mL Intracatheter Q12H   sodium chloride flush  3 mL Intravenous Q12H   Continuous Infusions:  sodium chloride     sodium chloride     sodium chloride 20 mL/hr at 06/18/20 1652   cefUROXime (ZINACEF)  IV Stopped (06/19/20 2350)   dexmedetomidine (PRECEDEX) IV infusion Stopped (06/19/20 0656)   insulin Stopped (06/19/20 0748)   lactated ringers     lactated ringers     lactated ringers 20 mL/hr at 06/20/20 0300   milrinone 0.15 mcg/kg/min (06/20/20 0300)   nitroGLYCERIN Stopped (06/19/20 1006)   phenylephrine (NEO-SYNEPHRINE) Adult infusion Stopped (06/18/20 2347)   PRN Meds:.sodium chloride, albuterol, dextrose, lactated ringers, metoprolol tartrate, midazolam, morphine injection, ondansetron (ZOFRAN) IV, oxyCODONE, sodium chloride flush,  sodium chloride flush, traMADol  General appearance: alert and cooperative Neurologic: intact Heart: regular rate and rhythm, S1, S2 normal, no murmur, click, rub or gallop Lungs: diminished breath sounds bibasilar Abdomen: soft, non-tender; bowel sounds normal; no masses,  no organomegaly Extremities: extremities normal, atraumatic, no cyanosis or edema and Homans sign is negative, no sign of DVT Wound: dressing intact  Lab Results: CBC: Recent Labs    06/19/20 1652 06/20/20 0441  WBC 12.6* 13.1*  HGB 10.9* 10.7*  HCT 33.9* 32.8*  PLT 112* 98*   BMET:  Recent Labs    06/19/20 1652 06/20/20 0441  NA 133* 131*  K 4.4 3.8  CL 100 101  CO2 25 25  GLUCOSE 126* 126*  BUN 10 11  CREATININE 0.96 0.84  CALCIUM 7.9* 7.8*     CMET: Lab Results  Component Value Date   WBC 13.1 (H) 06/20/2020   HGB 10.7 (L) 06/20/2020   HCT 32.8 (L) 06/20/2020   PLT 98 (L) 06/20/2020   GLUCOSE 126 (H) 06/20/2020   CHOL 195 06/17/2020   TRIG 159 (H) 06/17/2020   HDL 44 06/17/2020   LDLCALC 119 (H) 06/17/2020   ALT 29 06/20/2020   AST 44 (H) 06/20/2020   NA 131 (L) 06/20/2020   K 3.8 06/20/2020   CL 101 06/20/2020   CREATININE 0.84 06/20/2020   BUN 11 06/20/2020   CO2 25 06/20/2020   INR 1.3 (H) 06/18/2020   HGBA1C 6.5 (H) 06/18/2020      PT/INR:  Recent Labs    06/18/20 1722  LABPROT 15.9*  INR 1.3*   Radiology: No results found.   Assessment/Plan: S/P Procedure(s) (LRB): REDO CORONARY ARTERY BYPASS GRAFTING (CABG), ON PUMP, TIMES THREE, USING RIGHT INTERNAL MAMMARY ARTERY AND ENDOSCOPICALLY HARVESTED LEFT GREATER SAPHENOUS VEIN (N/A) TRANSESOPHAGEAL ECHOCARDIOGRAM (TEE) (N/A) ENDOVEIN HARVEST OF GREATER SAPHENOUS VEIN (Left) Mobilize Diuresis Diabetes control Plan for transfer to step-down: see transfer orders Will need referral to lopid clinic - statins caused fatty liver in the past per patient     Grace Isaac 06/20/2020 7:38 AM

## 2020-06-20 NOTE — Progress Notes (Signed)
      [address]       [place] [address]             [phone]      2 Days Post-Op Procedure(s) (LRB): REDO CORONARY ARTERY BYPASS GRAFTING (CABG), ON PUMP, TIMES THREE, USING RIGHT INTERNAL MAMMARY ARTERY AND ENDOSCOPICALLY HARVESTED LEFT GREATER SAPHENOUS VEIN (N/A) TRANSESOPHAGEAL ECHOCARDIOGRAM (TEE) (N/A) ENDOVEIN HARVEST OF GREATER SAPHENOUS VEIN (Left)   Subjective:  Up in chair,  States he is still alive.  Denies nausea, but states he is passing a lot of gas.  Objective: Vital signs in last 24 hours: Temp:  [98.5 F (36.9 C)-100.3 F (37.9 C)] 99 F (37.2 C) (08/20 0748) Pulse Rate:  [66-86] 76 (08/20 0600) Cardiac Rhythm: Normal sinus rhythm (08/20 0400) Resp:  [11-27] 15 (08/20 0600) BP: (100-145)/(62-89) 118/85 (08/20 0600) SpO2:  [93 %-100 %] 97 % (08/20 0600) Arterial Line BP: (130-145)/(64-73) 133/66 (08/19 1100) Weight:  [81.1 kg] 81.1 kg (08/20 0500)  Intake/Output from previous day: 08/19 0701 - 08/20 0700 In: 1419.4 [P.O.:580; I.V.:645.2; IV Piggyback:194.2] Out: 2330 [Urine:1970; Chest Tube:360]  General appearance: alert, cooperative and no distress Heart: regular rate and rhythm Lungs: clear to auscultation bilaterally Abdomen: soft, non-tender; bowel sounds normal; no masses,  no organomegaly Extremities: edema trace Wound: clean and dry  Lab Results: Recent Labs    06/19/20 1652 06/20/20 0441  WBC 12.6* 13.1*  HGB 10.9* 10.7*  HCT 33.9* 32.8*  PLT 112* 98*   BMET:  Recent Labs    06/19/20 1652 06/20/20 0441  NA 133* 131*  K 4.4 3.8  CL 100 101  CO2 25 25  GLUCOSE 126* 126*  BUN 10 11  CREATININE 0.96 0.84  CALCIUM 7.9* 7.8*    PT/INR:  Recent Labs    06/18/20 1722  LABPROT 15.9*  INR 1.3*   ABG    Component Value Date/Time   PHART 7.330 (L) 06/19/2020 0148   HCO3 23.6 06/19/2020 0148   TCO2 25 06/19/2020 0148   ACIDBASEDEF 2.0 06/19/2020 0148   O2SAT 63.1 06/20/2020 0432   CBG (last 3)    Recent Labs    06/19/20 2336 06/20/20 0404 06/20/20 0646  GLUCAP 127* 103* 104*    Assessment/Plan: S/P Procedure(s) (LRB): REDO CORONARY ARTERY BYPASS GRAFTING (CABG), ON PUMP, TIMES THREE, USING RIGHT INTERNAL MAMMARY ARTERY AND ENDOSCOPICALLY HARVESTED LEFT GREATER SAPHENOUS VEIN (N/A) TRANSESOPHAGEAL ECHOCARDIOGRAM (TEE) (N/A) ENDOVEIN HARVEST OF GREATER SAPHENOUS VEIN (Left)  1. CV- NSR, BP improved- continue Lopressor, Cozaar... on Milrinone at 0.15, can possibly d/c today as Co-ox is 63 2. Pulm- CT output 360 cc output yesterday, no significant pleural effusion, will d/c chest tubes, continue IS 3. Renal- creatinine WNL, weight is elevated, mild edema on exam, will give IV Lasix today, K at 3.8 supplementation ordered 4. Expected post operative blood loss anemia, Hgb at 10.7 5. Mild Thrombocytopenia, stable 6. CBGs controlled, will continue SSIP for now, can stop tomorrow 7. Dispo- patient stable, BP improved with Cozaar, can possibly d/c Milrinone today, start diuretics, supplement K, if off milrinone could possibly transfer to 4E later today vs. tomorrow   LOS: 4 days    Lowella Dandy, PA-C '06/20/2020

## 2020-06-21 ENCOUNTER — Inpatient Hospital Stay (HOSPITAL_COMMUNITY): Payer: Managed Care, Other (non HMO)

## 2020-06-21 LAB — BPAM RBC
Blood Product Expiration Date: 202108272359
Blood Product Expiration Date: 202108312359
Blood Product Expiration Date: 202109072359
Blood Product Expiration Date: 202109072359
ISSUE DATE / TIME: 202108180747
ISSUE DATE / TIME: 202108180747
ISSUE DATE / TIME: 202108180747
ISSUE DATE / TIME: 202108180747
Unit Type and Rh: 6200
Unit Type and Rh: 6200
Unit Type and Rh: 6200
Unit Type and Rh: 6200

## 2020-06-21 LAB — TYPE AND SCREEN
ABO/RH(D): A POS
Antibody Screen: NEGATIVE
Unit division: 0
Unit division: 0
Unit division: 0
Unit division: 0

## 2020-06-21 LAB — BASIC METABOLIC PANEL
Anion gap: 10 (ref 5–15)
BUN: 12 mg/dL (ref 6–20)
CO2: 25 mmol/L (ref 22–32)
Calcium: 8.6 mg/dL — ABNORMAL LOW (ref 8.9–10.3)
Chloride: 100 mmol/L (ref 98–111)
Creatinine, Ser: 0.88 mg/dL (ref 0.61–1.24)
GFR calc Af Amer: >60 mL/min (ref >60)
GFR calc non Af Amer: >60 mL/min (ref >60)
Glucose, Bld: 118 mg/dL — ABNORMAL HIGH (ref 70–99)
Potassium: 3.8 mmol/L (ref 3.5–5.1)
Sodium: 135 mmol/L (ref 135–145)

## 2020-06-21 LAB — CBC
HCT: 32.3 % — ABNORMAL LOW (ref 39.0–52.0)
Hemoglobin: 10.9 g/dL — ABNORMAL LOW (ref 13.0–17.0)
MCH: 31.4 pg (ref 26.0–34.0)
MCHC: 33.7 g/dL (ref 30.0–36.0)
MCV: 93.1 fL (ref 80.0–100.0)
Platelets: 112 10*3/uL — ABNORMAL LOW (ref 150–400)
RBC: 3.47 MIL/uL — ABNORMAL LOW (ref 4.22–5.81)
RDW: 14.5 % (ref 11.5–15.5)
WBC: 13.7 10*3/uL — ABNORMAL HIGH (ref 4.0–10.5)
nRBC: 0 % (ref 0.0–0.2)

## 2020-06-21 LAB — GLUCOSE, CAPILLARY: Glucose-Capillary: 125 mg/dL — ABNORMAL HIGH (ref 70–99)

## 2020-06-21 MED ORDER — LEVALBUTEROL HCL 0.63 MG/3ML IN NEBU
0.6300 mg | INHALATION_SOLUTION | Freq: Three times a day (TID) | RESPIRATORY_TRACT | Status: DC
  Administered 2020-06-21 – 2020-06-22 (×3): 0.63 mg via RESPIRATORY_TRACT
  Filled 2020-06-21 (×3): qty 3

## 2020-06-21 NOTE — Progress Notes (Signed)
Mobility Specialist: Progress Note    06/21/20 1453  Mobility  Activity Ambulated in hall  Level of Assistance Modified independent, requires aide device or extra time  Assistive Device Front wheel walker  Distance Ambulated (ft) 130 ft  Mobility Response Tolerated well  Mobility performed by Mobility specialist  Bed Position Chair  $Mobility charge 1 Mobility   Pre-Mobility: 88 HR, 104/68 BP, 95% SpO2 Post-Mobility: 88 HR, 120/64 BP, 88% SpO2  Pt tolerated mobility well. Pt said he felt a little winded but not too bad after returning to room. Sats were at 88% after sitting in chair. Sats now back up to 97%.   [facility] Vernell Back Mobility Specialist

## 2020-06-21 NOTE — Progress Notes (Addendum)
      [address]       [place] [address]             [phone]      3 Days Post-Op Procedure(s) (LRB): REDO CORONARY ARTERY BYPASS GRAFTING (CABG), ON PUMP, TIMES THREE, USING RIGHT INTERNAL MAMMARY ARTERY AND ENDOSCOPICALLY HARVESTED LEFT GREATER SAPHENOUS VEIN (N/A) TRANSESOPHAGEAL ECHOCARDIOGRAM (TEE) (N/A) ENDOVEIN HARVEST OF GREATER SAPHENOUS VEIN (Left)   Subjective:  Up in chair, doing okay.  States his asthma flares up when he is trying to walk.  Taking inhaler  Objective: Vital signs in last 24 hours: Temp:  [97.9 F (36.6 C)-98.9 F (37.2 C)] 98.9 F (37.2 C) (08/21 0526) Pulse Rate:  [75-91] 86 (08/21 0526) Cardiac Rhythm: Normal sinus rhythm (08/21 0744) Resp:  [11-21] 13 (08/21 0526) BP: (101-148)/(60-75) 109/68 (08/21 0526) SpO2:  [93 %-100 %] 100 % (08/21 0526) Weight:  [77.6 kg-79.2 kg] 79.2 kg (08/21 0526)  Intake/Output from previous day: 08/20 0701 - 08/21 0700 In: 263.4 [P.O.:240; I.V.:23.4] Out: 1330 [Urine:1300; Chest Tube:30]  General appearance: alert, cooperative and no distress Heart: regular rate and rhythm Lungs: clear to auscultation bilaterally Abdomen: soft, non-tender; bowel sounds normal; no masses,  no organomegaly Extremities: edema trace Wound: clean and dry  Lab Results: Recent Labs    06/20/20 0441 06/21/20 0156  WBC 13.1* 13.7*  HGB 10.7* 10.9*  HCT 32.8* 32.3*  PLT 98* 112*   BMET:  Recent Labs    06/20/20 0441 06/21/20 0156  NA 131* 135  K 3.8 3.8  CL 101 100  CO2 25 25  GLUCOSE 126* 118*  BUN 11 12  CREATININE 0.84 0.88  CALCIUM 7.8* 8.6*    PT/INR:  Recent Labs    06/18/20 1722  LABPROT 15.9*  INR 1.3*   ABG    Component Value Date/Time   PHART 7.330 (L) 06/19/2020 0148   HCO3 23.6 06/19/2020 0148   TCO2 25 06/19/2020 0148   ACIDBASEDEF 2.0 06/19/2020 0148   O2SAT 63.1 06/20/2020 0432   CBG (last 3)  Recent Labs    06/20/20 1621 06/20/20 2132 06/21/20 0632  GLUCAP  106* 123* 125*    Assessment/Plan: S/P Procedure(s) (LRB): REDO CORONARY ARTERY BYPASS GRAFTING (CABG), ON PUMP, TIMES THREE, USING RIGHT INTERNAL MAMMARY ARTERY AND ENDOSCOPICALLY HARVESTED LEFT GREATER SAPHENOUS VEIN (N/A) TRANSESOPHAGEAL ECHOCARDIOGRAM (TEE) (N/A) ENDOVEIN HARVEST OF GREATER SAPHENOUS VEIN (Left)  1. CV- NSR, BP improved- continue Lopressor, Cozaar 2. Pulm- no acute issues, continue IS, asthma inhalers prn 3. Renal- creatinine WNL, weight trending down, continue Lasix, potassium 4. Mild Thrombocytopenia, improving, CT up to 112 5. CBGs stable, not a diabetic will d/c SSIP 6. dispo- patient stable, will d/c EPW in AM, continue current care, planning for d/c Tuesday when sister can come to help take care of   LOS: 5 days    Lowella Dandy, PA-C 06/21/2020   Chart reviewed, patient examined, agree with above. He feels well overall. Passing a lot of flatus but no BM in two days. Has had BM since surgery though.

## 2020-06-21 NOTE — Progress Notes (Signed)
CARDIAC REHAB PHASE I   PRE:  Rate/Rhythm: 90 SR  BP:  Supine:   Sitting: 109/66  Standing:    SaO2: 96% RA  MODE:  Ambulation: 100 ft   POST:  Rate/Rhythm: 94 SR  BP:  Supine:   Sitting: 119/70  Standing:    SaO2: 96% RA   9166-0600 Patient tolerated ambulation fair with assist x1 and pushing rolling walker, gait slow, steady. To chair after walk with legs elevated, call bell within reach.   Artist Pais, MS, ACSM CEP

## 2020-06-21 NOTE — Progress Notes (Signed)
CARDIAC REHAB PHASE I Patient declined ambulation at this time. Patient states the he's "tight in the chest" because of his breathing and just walked back from the bathroom. Encouraged patient to try and get 3 walks up in the hall, and patient is amenable to this. Will follow-up later as able.  Artist Pais, MS, ACSM [employer] 06/21/2020 [phone]

## 2020-06-22 MED ORDER — LEVALBUTEROL HCL 0.63 MG/3ML IN NEBU: 0.6300 mg | INHALATION_SOLUTION | Freq: Four times a day (QID) | RESPIRATORY_TRACT | Status: DC | PRN

## 2020-06-22 NOTE — Progress Notes (Signed)
Pacing wires removed per order. Pt tolerated well. VSS. pt educated on bed rest. Will continue to monitor   Everlean Cherry, RN

## 2020-06-22 NOTE — Progress Notes (Signed)
Mobility Specialist: Progress Note    06/22/20 1143  Mobility  Activity Ambulated in hall  Level of Assistance Modified independent, requires aide device or extra time  Assistive Device Front wheel walker  Distance Ambulated (ft) 430 ft  Mobility Response Tolerated well  Mobility performed by Mobility specialist  Bed Position Chair  $Mobility charge 1 Mobility   Pre-Mobility: 80 HR, 108/65 BP, 93% SpO2 During Mobility: 99% SpO2 Post-Mobility: 90 HR, 124/79 BP  Pt said he feels much better today and not nearly as SOB as he was yesterday.   [facility] Rilla Buckman Mobility Specialist

## 2020-06-22 NOTE — Progress Notes (Addendum)
      [address]       [place] [address]             [phone]       4 Days Post-Op Procedure(s) (LRB): REDO CORONARY ARTERY BYPASS GRAFTING (CABG), ON PUMP, TIMES THREE, USING RIGHT INTERNAL MAMMARY ARTERY AND ENDOSCOPICALLY HARVESTED LEFT GREATER SAPHENOUS VEIN (N/A) TRANSESOPHAGEAL ECHOCARDIOGRAM (TEE) (N/A) ENDOVEIN HARVEST OF GREATER SAPHENOUS VEIN (Left)   Subjective:   Complains of pain at left IV site.  He states very painful to bend his arm and he heard a cracking noise and thinks the IV broke.  He ambulated yesterday and felt his breathing was better  Objective: Vital signs in last 24 hours: Temp:  [97.8 F (36.6 C)-98.3 F (36.8 C)] 97.8 F (36.6 C) (08/22 0500) Pulse Rate:  [80-93] 93 (08/22 0500) Cardiac Rhythm: Normal sinus rhythm (08/21 2020) Resp:  [11-18] 11 (08/22 0500) BP: (106-126)/(62-81) 126/81 (08/22 0500) SpO2:  [97 %-100 %] 100 % (08/22 0500) Weight:  [77.7 kg] 77.7 kg (08/22 0500)  General appearance: alert, cooperative and no distress Heart: regular rate and rhythm Lungs: clear to auscultation bilaterally Abdomen: soft, non-tender; bowel sounds normal; no masses,  no organomegaly Extremities: edema minimal Wound: clean and dry  Lab Results: Recent Labs    06/20/20 0441 06/21/20 0156  WBC 13.1* 13.7*  HGB 10.7* 10.9*  HCT 32.8* 32.3*  PLT 98* 112*   BMET:  Recent Labs    06/20/20 0441 06/21/20 0156  NA 131* 135  K 3.8 3.8  CL 101 100  CO2 25 25  GLUCOSE 126* 118*  BUN 11 12  CREATININE 0.84 0.88  CALCIUM 7.8* 8.6*    PT/INR: No results for input(s): LABPROT, INR in the last 72 hours. ABG    Component Value Date/Time   PHART 7.330 (L) 06/19/2020 0148   HCO3 23.6 06/19/2020 0148   TCO2 25 06/19/2020 0148   ACIDBASEDEF 2.0 06/19/2020 0148   O2SAT 63.1 06/20/2020 0432   CBG (last 3)  Recent Labs    06/20/20 1621 06/20/20 2132 06/21/20 0632  GLUCAP 106* 123* 125*    Assessment/Plan: S/P  Procedure(s) (LRB): REDO CORONARY ARTERY BYPASS GRAFTING (CABG), ON PUMP, TIMES THREE, USING RIGHT INTERNAL MAMMARY ARTERY AND ENDOSCOPICALLY HARVESTED LEFT GREATER SAPHENOUS VEIN (N/A) TRANSESOPHAGEAL ECHOCARDIOGRAM (TEE) (N/A) ENDOVEIN HARVEST OF GREATER SAPHENOUS VEIN (Left)  1. CV- NSR, BP stable- continue Lopressor, Cozaar.. will d/c EPW today 2. Pulm- off oxygen, no acute issues, continue IS, prn inhalers 3. Renal- creatinine has been WNL, weight is at baseline, continue Lasix for now 4. LUE- minor swelling in [facility] with ecchymosis, will d/c peripheral IV 5. Dispo- patient stable, maintaining NSR, d/c EPW, continue diuresis for now, breathing improving with ambulation... planning to discharge Tuesday when sister arrives     LOS: 6 days   Lowella Dandy, PA-C 06/22/2020   Chart reviewed, patient examined, agree with above. He looks and feels well. Left arm feels better after IV removed. There is no cellulitis or palpable cord. Plan home Tuesday.

## 2020-06-22 NOTE — Progress Notes (Signed)
Mobility Specialist: Progress Note    06/22/20 1556  Mobility  Activity Ambulated in hall  Level of Assistance Modified independent, requires aide device or extra time  Assistive Device Front wheel walker  Distance Ambulated (ft) 510 ft  Mobility Response Tolerated well  Mobility performed by Mobility specialist  Bed Position Chair  $Mobility charge 1 Mobility   Pre-Mobility: 81 HR, 95% SpO2 Post-Mobility: 94 HR, 113/73 BP, 99% SpO2  Pt said he feels a jabbing feeling in the L side of his chest that occurs off and on, RN notified. Pt had no c/o during ambulation.   [facility] Gregory Gardner Mobility Specialist

## 2020-06-23 LAB — BASIC METABOLIC PANEL
Anion gap: 13 (ref 5–15)
BUN: 16 mg/dL (ref 6–20)
CO2: 21 mmol/L — ABNORMAL LOW (ref 22–32)
Calcium: 9.1 mg/dL (ref 8.9–10.3)
Chloride: 103 mmol/L (ref 98–111)
Creatinine, Ser: 0.83 mg/dL (ref 0.61–1.24)
GFR calc Af Amer: >60 mL/min (ref >60)
GFR calc non Af Amer: >60 mL/min (ref >60)
Glucose, Bld: 114 mg/dL — ABNORMAL HIGH (ref 70–99)
Potassium: 4 mmol/L (ref 3.5–5.1)
Sodium: 137 mmol/L (ref 135–145)

## 2020-06-23 MED ORDER — LOSARTAN POTASSIUM 25 MG PO TABS
25.0000 mg | ORAL_TABLET | Freq: Every day | ORAL | 3 refills | Status: DC
Start: 2020-06-23 — End: 2020-10-21

## 2020-06-23 MED ORDER — METOPROLOL TARTRATE 25 MG PO TABS
25.0000 mg | ORAL_TABLET | Freq: Two times a day (BID) | ORAL | Status: DC
  Administered 2020-06-23 – 2020-06-24 (×3): 25 mg via ORAL
  Filled 2020-06-23 (×3): qty 1

## 2020-06-23 MED ORDER — DULOXETINE HCL 30 MG PO CPEP: 30.0000 mg | ORAL_CAPSULE | Freq: Every day | ORAL | 2 refills | Status: DC

## 2020-06-23 MED ORDER — GUAIFENESIN ER 600 MG PO TB12: 600.0000 mg | ORAL_TABLET | Freq: Two times a day (BID) | ORAL | Status: DC | PRN

## 2020-06-23 MED ORDER — ASPIRIN 325 MG PO TBEC
325.0000 mg | DELAYED_RELEASE_TABLET | Freq: Every day | ORAL | 0 refills | Status: DC
Start: 2020-06-23 — End: 2020-07-08

## 2020-06-23 MED ORDER — DULOXETINE HCL 30 MG PO CPEP: 30.0000 mg | ORAL_CAPSULE | Freq: Every day | ORAL | 3 refills | Status: DC

## 2020-06-23 MED ORDER — ACETAMINOPHEN 325 MG PO TABS: 650.0000 mg | ORAL_TABLET | Freq: Four times a day (QID) | ORAL | Status: DC | PRN

## 2020-06-23 MED ORDER — METOPROLOL TARTRATE 25 MG PO TABS: 25.0000 mg | ORAL_TABLET | Freq: Two times a day (BID) | ORAL | 3 refills | Status: DC

## 2020-06-23 MED ORDER — OXYCODONE HCL 5 MG PO TABS: 5.0000 mg | ORAL_TABLET | ORAL | 0 refills | Status: DC | PRN

## 2020-06-23 MED ORDER — ATORVASTATIN CALCIUM 80 MG PO TABS
80.0000 mg | ORAL_TABLET | Freq: Every evening | ORAL | 3 refills | Status: DC
Start: 2020-06-23 — End: 2020-07-08

## 2020-06-23 NOTE — Progress Notes (Addendum)
      [address]       [place] [address]             [phone]      5 Days Post-Op Procedure(s) (LRB): REDO CORONARY ARTERY BYPASS GRAFTING (CABG), ON PUMP, TIMES THREE, USING RIGHT INTERNAL MAMMARY ARTERY AND ENDOSCOPICALLY HARVESTED LEFT GREATER SAPHENOUS VEIN (N/A) TRANSESOPHAGEAL ECHOCARDIOGRAM (TEE) (N/A) ENDOVEIN HARVEST OF GREATER SAPHENOUS VEIN (Left)   Subjective:  No new complaints.  Left arm is feeling better.  + ambulation + BM  Objective: Vital signs in last 24 hours: Temp:  [97.6 F (36.4 C)-98.7 F (37.1 C)] 98.7 F (37.1 C) (08/23 0447) Pulse Rate:  [63-100] 100 (08/23 0447) Cardiac Rhythm: Normal sinus rhythm (08/22 2005) Resp:  [14-18] 14 (08/23 0447) BP: (109-139)/(70-89) 139/84 (08/23 0447) SpO2:  [92 %-100 %] 100 % (08/23 0447) Weight:  [76.3 kg] 76.3 kg (08/23 0447)  Intake/Output from previous day: 08/22 0701 - 08/23 0700 In: 600 [P.O.:600] Out: 850 [Urine:850]  General appearance: alert, cooperative and no distress Heart: regular rate and rhythm Lungs: clear to auscultation bilaterally Abdomen: soft, non-tender; bowel sounds normal; no masses,  no organomegaly Extremities: edema trace Wound: clean and dry  Lab Results: Recent Labs    06/21/20 0156  WBC 13.7*  HGB 10.9*  HCT 32.3*  PLT 112*   BMET:  Recent Labs    06/21/20 0156 06/23/20 0219  NA 135 137  K 3.8 4.0  CL 100 103  CO2 25 21*  GLUCOSE 118* 114*  BUN 12 16  CREATININE 0.88 0.83  CALCIUM 8.6* 9.1    PT/INR: No results for input(s): LABPROT, INR in the last 72 hours. ABG    Component Value Date/Time   PHART 7.330 (L) 06/19/2020 0148   HCO3 23.6 06/19/2020 0148   TCO2 25 06/19/2020 0148   ACIDBASEDEF 2.0 06/19/2020 0148   O2SAT 63.1 06/20/2020 0432   CBG (last 3)  Recent Labs    06/20/20 1621 06/20/20 2132 06/21/20 0632  GLUCAP 106* 123* 125*    Assessment/Plan: S/P Procedure(s) (LRB): REDO CORONARY ARTERY BYPASS GRAFTING (CABG),  ON PUMP, TIMES THREE, USING RIGHT INTERNAL MAMMARY ARTERY AND ENDOSCOPICALLY HARVESTED LEFT GREATER SAPHENOUS VEIN (N/A) TRANSESOPHAGEAL ECHOCARDIOGRAM (TEE) (N/A) ENDOVEIN HARVEST OF GREATER SAPHENOUS VEIN (Left)  1. CV- NSR, Tachy at times, HTN controlled- continue Lopressor, Cozaar 2. Pulm- no acute issues, continue IS 3. Renal-creatinine WNL, continue Lasix, potassium 4. LUE- IV removed, no residual erythema, edema... no evidence of phlebitis, cellulitis 5. Dispo- patient stable, titrated Lopressor dose for additional HR coverage, will plan to d/c home tomorrow   LOS: 7 days   Lowella Dandy, PA-C 06/23/2020  Plan d/c Tuesday, Patient siditer is coming to help care for him at home I have seen and examined Gregory Gardner and agree with the above assessment  and plan.  Delight Ovens MD Beeper [phone] Office [phone] 06/24/2020 7:23 AM

## 2020-06-23 NOTE — Progress Notes (Signed)
CARDIAC REHAB PHASE I   PRE:  Rate/Rhythm: 92 SR    BP: sitting 140/77    SaO2: 98 RA  MODE:  Ambulation: 550 ft   POST:  Rate/Rhythm: 107 ST    BP: sitting 154/74     SaO2: 98 RA  Pt moving well. Stood independently and walked with RW. Steady, no c/o. Return to recliner. Encouraged x2 more walks and IS. [phone]   Harriet Masson CES, ACSM 06/23/2020 11:06 AM

## 2020-06-23 NOTE — Progress Notes (Signed)
Mobility Specialist: Progress Note    06/23/20 1439  Mobility  Activity Ambulated in hall  Level of Assistance Modified independent, requires aide device or extra time  Assistive Device Front wheel walker  Distance Ambulated (ft) 930 ft  Mobility Response Tolerated well  Mobility performed by Mobility specialist  Bed Position Chair  $Mobility charge 1 Mobility   Pre-Mobility: 87 HR, 112/73 BP, 92% SpO2 Post-Mobility: 95 HR, 117/72 BP  Pt had no c/o during ambulation.   [facility] Shley Dolby Mobility Specialist

## 2020-06-23 NOTE — Discharge Instructions (Signed)

## 2020-06-23 NOTE — Discharge Summary (Signed)
[address]       [place] [address]             [phone]    Physician Discharge Summary  Patient ID: LEEUM SANKEY MRN: [id number] DOB/AGE: Aug 29, 1961 59 y.o.  Admit date: 06/16/2020 Discharge date: 06/24/2020  Admission Diagnoses:  Patient Active Problem List   Diagnosis Date Noted  . Non-ST elevation (NSTEMI) myocardial infarction (HCC) 06/16/2020  . ACS (acute coronary syndrome) (HCC) 06/16/2020  . S/P CABG x 5 11/21/2012  . Claudication (HCC) 11/21/2012  . Multiple sclerosis (HCC) 11/21/2012  . HTN (hypertension) 11/21/2012  . Mixed hyperlipidemia 11/21/2012   Discharge Diagnoses:  Principal Problem:   Non-ST elevation (NSTEMI) myocardial infarction [facility]) Active Problems:   S/P CABG x 5   Claudication (HCC)   Multiple sclerosis (HCC)   HTN (hypertension)   Mixed hyperlipidemia   ACS (acute coronary syndrome) (HCC)   S/P CABG x 3  Discharged Condition: good  Mr. Kalb is a 59 yo male with history of MS, known CAD S/P CABG x 5 performed in 2003 by Dr. Tyrone Sage, HTN, and Mixed Hyperlipidemia.   He presented to OSH with complaints of chest pain.  The patient states this developed acutely about 30 min prior to presenting to ED.  He was ruled in for ACS/NSTEMI and transferred to [facility] for evaluation.  He was taken to the catheterization lab and found to have multivessel CAD with patent LIMA to LAD, and severe disease in previous saphenous vein grafts.  He had a mildly reduced EF of 40-50%.  It was felt coronary bypass grafting would be indicated.  Currently the patient is chest pain free.  He denied N/V and diaphoresis on presentation.  He states when he had this before it was much worse.  He denies history of smoking.  He does have MS which he receives IV Ocrelizumab every 6 months.  He just received a dose of this the other day.  He is functional and performs his daily activities without difficulty.  He is currently off work on  workers compensation due to a back injury, stating he was due to have an MRI tomorrow to evaluate this.   Hospital Course:  He was evaluated by Dr. Tyrone Sage who felt patient would be a candidate for surgery.  However being a redo the procedure would carry a slightly higher risk.  The risks and benefits of the procedure were explained to the patient and he was agreeable to proceed.  He was taken to the operating room on 06/18/2020.  He underwent Redo Sternotomy, CABG x 3 utilizing LIMA, Sequential SVG to Diagonal and OM, and RIMA to distal Right Vein Graft.  He also underwent endoscopic harvest of greater saphenous vein harvest from his left leg.  He tolerated the procedure without difficulty and was taken to the SICU in stable condition.  He was extubated the evening of surgery without difficulty.  During his stay in the SICU the patient was weaned off NTG and Milrinone drips as tolerated.  His chest tubes and arterial lines were removed without difficulty. His was started on IV lasix, and his potassium was supplemented accordingly.  He was maintaining NSR and was transferred to the progressive care unit on 06/20/2020.  He continued to make good progress.  He remains in NSR and his pacing wires were removed without difficulty.  His blood pressure remains well controlled on Cozaar.  He is ambulating without difficulty.  His  incisions are healing without evidence of infection.  He is medically stable for discharge home today.   Addendum: On 06/25/2019 he was noted to have some drainage from the lower portion of his sternal incision.  I discussed this with Dr. Tyrone Sage who recommended we start him on oral Keflex at time of discharge.  We will also see him in 2 days in the office to check the wound and prior to that should there be any changes such as fevers, erythema or other worsening signs of infection.  I discussed with the patient things to look for and he understands.   Significant Diagnostic Studies:  angiography:    Severe disease in the sequential saphenous vein graft to the diagonal and obtuse marginal. The continuation to the obtuse marginal is totally occluded. Proximal to mid vessel contains shaggy 99% regions of stenosis. The marginal fills by collaterals.  Severe disease in the sequential saphenous vein graft to the PDA and second left ventricular branch with occlusion of the distal anastomosis to the left ventricular branch. The ostial to proximal graft contains 75% stenosis. The insertion site into the PDA is 70% narrowed. The proximal and distal PDA at the insertion site contains high-grade stenosis. Second and third left ventricular branches fill by collaterals.  Patent LIMA to LAD.  Left main is patent  Proximal LAD is totally occluded  Circumflex is ostially occluded  Native RCA is distally occluded  Anterior wall moderate hypokinesis. EF 40 to 50%. LVEDP is normal.  Treatments:   PROCEDURE:  Procedure(s) with comments: REDO CORONARY ARTERY BYPASS GRAFTING (CABG), ON PUMP, TIMES THREE, USING RIGHT INTERNAL MAMMARY ARTERY AND ENDOSCOPICALLY HARVESTED LEFT GREATER SAPHENOUS VEIN (N/A) - SEQENTIAL DIAG TO OM RIMA TO PREVIOUS DISTAL RIGHT VEIN GRAFT TRANSESOPHAGEAL ECHOCARDIOGRAM (TEE) (N/A) ENDOVEIN HARVEST OF GREATER SAPHENOUS VEIN (Left) RIMA-RCA VEIN GRAFT SVG-DIAG-OM  Discharge Exam: Blood pressure 119/67, pulse 96, temperature 98.5 F (36.9 C), temperature source Oral, resp. rate 17, height 5\' 11"  (1.803 m), weight 73.9 kg, SpO2 96 %.   General appearance: alert, cooperative and no distress Heart: regular rate and rhythm Lungs: clear to auscultation bilaterally Abdomen: benign Extremities: no edema Wound: incis healing well without erethema but mod serodang drainage from lower pole of sternal incision, sternum is stable Discharge Medications:  The patient has been discharged on:   1.Beta Blocker:  Yes [ X  ]                              No   [   ]                               If No, reason:  2.Ace Inhibitor/ARB: Yes [  X ]                                     No  [    ]                                     If No, reason:  3.Statin:   Yes [ X  ]                  No  [   ]  If No, reason:  4.Ecasa:  Yes  [  X ]                  No   [   ]                  If No, reason:    Discharge Instructions    Discharge patient   Complete by: As directed    Discharge disposition: 01-Home or Self Care   Discharge patient date: 06/24/2020     Allergies as of 06/24/2020      Reactions   Lodine [etodolac] Nausea Only      Medication List    STOP taking these medications   metoprolol succinate 25 MG 24 hr tablet Commonly known as: TOPROL-XL     TAKE these medications   acetaminophen 325 MG tablet Commonly known as: TYLENOL Take 2 tablets (650 mg total) by mouth every 6 (six) hours as needed for mild pain.   alendronate 70 MG tablet Commonly known as: FOSAMAX Take 70 mg by mouth once a week. Take with a full glass of water on an empty stomach.   aspirin 325 MG EC tablet Take 1 tablet (325 mg total) by mouth daily.   atorvastatin 80 MG tablet Commonly known as: LIPITOR Take 1 tablet (80 mg total) by mouth every evening.   cephALEXin 500 MG capsule Commonly known as: KEFLEX Take 1 capsule (500 mg total) by mouth every 8 (eight) hours.   cholecalciferol 1000 units tablet Commonly known as: VITAMIN D Take 1,000 Units by mouth daily.   DULoxetine 30 MG capsule Commonly known as: CYMBALTA Take 1 capsule (30 mg total) by mouth daily.   DULoxetine 30 MG capsule Commonly known as: CYMBALTA Take 1 capsule (30 mg total) by mouth daily.   ezetimibe 10 MG tablet Commonly known as: ZETIA Take 10 mg by mouth daily.   Fenofibric Acid 135 MG Cpdr Take 1 tablet by mouth daily.   guaiFENesin 600 MG 12 hr tablet Commonly known as: MUCINEX Take 1 tablet (600 mg total) by mouth every 12 (twelve) hours as needed for  cough.   losartan 25 MG tablet Commonly known as: COZAAR Take 1 tablet (25 mg total) by mouth daily.   metoprolol tartrate 25 MG tablet Commonly known as: LOPRESSOR Take 1 tablet (25 mg total) by mouth 2 (two) times daily.   nitroGLYCERIN 0.4 MG SL tablet Commonly known as: NITROSTAT Place 0.4 mg under the tongue every 5 (five) minutes as needed for chest pain.   ocrelizumab 300 mg in sodium chloride 0.9 % 250 mL Inject 300 mg into the vein every 6 (six) months.   omeprazole 40 MG capsule Commonly known as: PRILOSEC Take 40 mg by mouth daily.   oxyCODONE 5 MG immediate release tablet Commonly known as: Oxy IR/ROXICODONE Take 1 tablet (5 mg total) by mouth every 4 (four) hours as needed for severe pain.   VENTOLIN HFA IN Inhale 1 puff into the lungs daily as needed (for wheezing).       Follow-up Information    Delight Ovens, MD Follow up on 07/17/2020.   Specialty: Cardiothoracic Surgery Why: Appointment is at 3:30, please get CXR at 3:00 at [facility] located on first floor of our office building Contact information: [address] [place] [address] [phone]        Tereso Newcomer T, PA-C Follow up on 07/08/2020.   Specialties: Cardiology, Physician Assistant Why: Appointment  is at 11:45 Contact information: [address] [place] [address] [phone]               Signed: Rowe Clack PA-C 06/24/2020, 8:48 AM

## 2020-06-24 MED ORDER — CEPHALEXIN 500 MG PO CAPS
500.0000 mg | ORAL_CAPSULE | Freq: Three times a day (TID) | ORAL | Status: DC
  Administered 2020-06-24: 500 mg via ORAL
  Filled 2020-06-24: qty 1

## 2020-06-24 MED ORDER — CEPHALEXIN 500 MG PO CAPS: 500.0000 mg | ORAL_CAPSULE | Freq: Three times a day (TID) | ORAL | 0 refills | Status: DC

## 2020-06-24 NOTE — Progress Notes (Signed)
Discussed sternal precautions, IS, diet, exercise, and CRPII. Pt somewhat receptive. He is not interested in CRPII however I will refer to [facility].  [phone] Ethelda Chick CES, ACSM 10:24 AM 06/24/2020

## 2020-06-24 NOTE — Progress Notes (Signed)
      [address]       [place] [address]             [phone]      6 Days Post-Op Procedure(s) (LRB): REDO CORONARY ARTERY BYPASS GRAFTING (CABG), ON PUMP, TIMES THREE, USING RIGHT INTERNAL MAMMARY ARTERY AND ENDOSCOPICALLY HARVESTED LEFT GREATER SAPHENOUS VEIN (N/A) TRANSESOPHAGEAL ECHOCARDIOGRAM (TEE) (N/A) ENDOVEIN HARVEST OF GREATER SAPHENOUS VEIN (Left) Subjective: Feels fine, having new drainage from lower pole of sternal incision  Objective: Vital signs in last 24 hours: Temp:  [98.3 F (36.8 C)-98.5 F (36.9 C)] 98.4 F (36.9 C) (08/24 0634) Pulse Rate:  [81-96] 86 (08/24 0634) Cardiac Rhythm: Normal sinus rhythm (08/23 1926) Resp:  [15-18] 18 (08/24 0634) BP: (112-131)/(64-84) 130/69 (08/24 0634) SpO2:  [96 %-100 %] 96 % (08/24 0634) Weight:  [73.9 kg] 73.9 kg (08/24 0500)  Hemodynamic parameters for last 24 hours:    Intake/Output from previous day: No intake/output data recorded. Intake/Output this shift: No intake/output data recorded.  General appearance: alert, cooperative and no distress Heart: regular rate and rhythm Lungs: clear to auscultation bilaterally Abdomen: benign Extremities: no edema Wound: incis healing well without erethema but mod serodang drainage from lower pole of sternal incision, sternum is stable  Lab Results: No results for input(s): WBC, HGB, HCT, PLT in the last 72 hours. BMET:  Recent Labs    06/23/20 0219  NA 137  K 4.0  CL 103  CO2 21*  GLUCOSE 114*  BUN 16  CREATININE 0.83  CALCIUM 9.1    PT/INR: No results for input(s): LABPROT, INR in the last 72 hours. ABG    Component Value Date/Time   PHART 7.330 (L) 06/19/2020 0148   HCO3 23.6 06/19/2020 0148   TCO2 25 06/19/2020 0148   ACIDBASEDEF 2.0 06/19/2020 0148   O2SAT 63.1 06/20/2020 0432   CBG (last 3)  No results for input(s): GLUCAP in the last 72 hours.  Meds Scheduled Meds: . aspirin EC  325 mg Oral Daily  . atorvastatin  80  mg Oral QPM  . Chlorhexidine Gluconate Cloth  6 each Topical Daily  . docusate sodium  200 mg Oral Daily  . DULoxetine  30 mg Oral Daily  . enoxaparin (LOVENOX) injection  40 mg Subcutaneous QHS  . fenofibrate  160 mg Oral Daily  . losartan  25 mg Oral Daily  . metoprolol tartrate  25 mg Oral BID  . pantoprazole  40 mg Oral QAC breakfast  . sodium chloride flush  3 mL Intravenous Q12H   Continuous Infusions: . sodium chloride     PRN Meds:.sodium chloride, acetaminophen, albuterol, bisacodyl **OR** bisacodyl, guaiFENesin, levalbuterol, ondansetron **OR** ondansetron (ZOFRAN) IV, oxyCODONE, sodium chloride flush, traMADol  Xrays No results found.  Assessment/Plan: S/P Procedure(s) (LRB): REDO CORONARY ARTERY BYPASS GRAFTING (CABG), ON PUMP, TIMES THREE, USING RIGHT INTERNAL MAMMARY ARTERY AND ENDOSCOPICALLY HARVESTED LEFT GREATER SAPHENOUS VEIN (N/A) TRANSESOPHAGEAL ECHOCARDIOGRAM (TEE) (N/A) ENDOVEIN HARVEST OF GREATER SAPHENOUS VEIN (Left)  1 afebrile, VSS , sats good on RA, NSR 2 no new labs or xrays 3 CBG well controlled 4 discussed with Dr Tyrone Sage, will start on po Keflex and see in office on Thursday . He is stable for discharge     LOS: 8 days    Rowe Clack PA-C Pager [phone] 06/24/2020

## 2020-06-24 NOTE — Op Note (Signed)
NAMEMARVIE, [name] MEDICAL RECORD [id number] ACCOUNT [id number] DATE OF BIRTH:[birth year] FACILITY: [facility] LOCATION: [facility] PHYSICIAN:Gregory Gardner Bari Gregory Beske, MD  OPERATIVE REPORT  DATE OF PROCEDURE:  06/18/2020  PREOPERATIVE DIAGNOSIS:  Recurrent coronary artery disease.  POSTOPERATIVE DIAGNOSIS:  Recurrent coronary artery disease.  SURGICAL PROCEDURE:  Redo coronary artery bypass grafting x3 with the right internal mammary to the distal right coronary artery graft, sequential reverse saphenous vein graft to the diagonal, first obtuse marginal with left leg endovein harvesting.  SURGEON:  Gregory Plane, MD  FIRST ASSISTANT:  Gregory Gardner, [place].  BRIEF HISTORY:  The patient is a 59 year old male who 18 years ago underwent coronary artery bypass grafting by me.  At that time, he had coronary artery bypass grafting x5 after he presented with an inferior myocardial infarction.  At that time, he had  mammary artery placed to the LAD, sequential reverse saphenous vein graft to the PD and PL, which were both noted to be 1 mm in size and small vessels at his previous operation.  In addition, he had sequential vein graft to the intermediate and  circumflex coronary artery.  Overall, ventricular function was preserved.  The patient had done well until he had recurrent symptoms now 18 years postop with elevated troponin.  He was transferred to [facility] and underwent repeat cardiac  catheterization by Dr. Verdis Gardner.  This revealed a large patent left internal mammary artery to the LAD with a proximal and mid disease, the vein graft to the diagonal with loss of the distal extent of the vein graft to the circumflex.  The right vein  graft had proximal narrowing.  The remainder of the graft appeared patent, but went to a very small posterior descending branch.  Overall, ventricular function appeared preserved.  Because of the patient's anatomic critical obstructions, in addition to  both his native  right coronary artery and left main were totally occluded, coronary artery bypass grafting was recommended.  The patient agreed and signed informed consent.  The patient does have a history of multiple sclerosis, for which he takes  medication; however, he has remained fully employed and functional.  The radial artery was the primary supplier of the hand bilaterally on preoperative testing.  DESCRIPTION OF PROCEDURE:  With Swan-Ganz and arterial line monitors in place, the patient underwent general endotracheal anesthesia without incident.  The skin of the chest and legs was prepped with Betadine, draped in the usual sterile manner.   Appropriate timeout was performed and we proceeded with endoscopic vein harvesting of a segment of reverse saphenous vein graft from the left thigh.  The previous incision was opened down to the sternum.  Sternal wires were removed and using a sagittal  saw, sternotomy was performed.  The underlying tissue adherent to this back wall of the sternum was dissected free.  A Rultract was used to elevate the right side of the sternum.  With this, the right internal mammary artery was taken down as a pedicle  graft.  The vessel was of adequate size and caliber, but not enough length to reach through the posterior sinus to the diagonal or circumflex.  We then continued tedious dissection, isolating the ascending aorta and the right atrium, taking care not to  disrupt the present intact vein grafts.  In addition, we placed the Rultract on the left side, which aided in exposure and dissecting along the anterior wall of the heart, locating the insertion site of the left internal mammary artery.  With this  tedious dissection completed, we had access to the ascending aorta and the right atrium for cannulation.  The patient was systemically heparinized.  The ascending aorta was cannulated.  A retrograde cardioplegic catheter was placed into the coronary  sinus.  A dual stage venous cannula  was placed into the right atrium.  The patient was then placed on cardiopulmonary bypass and the remainder of the heart was dissected free and as noted, the distal extent of the right coronary artery was anastomosed to  the posterior descending showed a very small vessel that would be difficult to graft.  Knowing that this was patent and the disease was primarily in the proximal right graft, we decided to place the right internal mammary artery to the distal right vein  graft with the sites.  The previous anastomosis to the diagonal and the circumflex were identified with some difficulty while taking care not to injure the left internal mammary artery.  The patient's body temperature was then cooled to 32 degrees.   Aortic crossclamp was applied.  Initially, antegrade plegia was administered and additionally administered retrograde plegia.  We had dissected out under the mammary artery to allow passage of the vein graft.  We identified the diagonal coronary artery,  which was then opened and was approximately 1.2 mm in size.  Using a diamond type side-to-side anastomosis, a segment of reverse saphenous vein graft was carried out.  The distal extent of the same vein was then carried to the circumflex vessel, which  was also relatively small, but was able to be opened, admitted a 1 mm probe distally.  Using a running 7-0 Prolene, distal anastomosis was performed.  Additional cold blood cardioplegia was administered down the vein graft, sequentially to the diagonal  and circumflex.  During the crossclamp period, the bulldog had been placed on the mammary artery.  We then turned our attention to the right internal mammary artery pedicle.  We opened the distal portion of the vein graft to the right.  This area was  relatively free of disease.  A 1.5 mm probe passed distally easily.  Using a running 7-0 Prolene, a distal anastomosis was performed.  With the clamp still in place, aortic cardioplegia needle was  removed where it had been placed in the hood of the vein  graft to the diagonal.  This area was opened further and the new vein graft was anastomosed to the hood of the previous vein graft.  The heart was then allowed to passively fill and deair.  The bulldog was removed from both the right and left mammary  arteries, with rise in myocardial septal temperature.  The aortic crossclamp was removed with total crossclamp time of 113 minutes.  The patient spontaneously converted to a sinus rhythm.  There was prompt rise in myocardial septal temperature.  With the  sites of anastomosis inspected and free of bleeding and the patient's body temperature rewarmed to 37 degrees, he was then ventilated and weaned from cardiopulmonary bypass on milrinone and dopamine low dose.  He remained hemodynamically stable.  The  right retrograde coronary cardioplegia needle was removed.  The patient separated from cardiopulmonary bypass without difficulty with a total pump time 162 minutes.  He was decannulated in the usual fashion.  Protamine sulfate was administered.  With  operative field hemostatic, a right pleural tube and 2 Blake mediastinal drains were left in the mediastinum.  The sternum was closed with #6 stainless steel wire.  Fascia closed with interrupted 0 Vicryl, running  3-0 Vicryl, subcutaneous tissue, 4-0  subcuticular stitch in skin edges.  Dry dressings were applied.  Sponge and needle count was reported as correct at completion of procedure.  The patient tolerated the procedure without obvious complication.  Total pump time was 162 minutes.  He did  require 2 units of packed red blood cells for a low hematocrit that was noted on labs just prior to instigating anesthesia.  VN/NUANCE  D:06/24/2020 T:06/24/2020 JOB:[id number]

## 2020-06-24 NOTE — Progress Notes (Signed)
Pt discharged home with sister. IV and telemetry box removed. Pt received discharge instructions and all questions answered. Pt left via wheelchair and was accompanied by pt's sister and a Insurance account manager.

## 2020-06-26 ENCOUNTER — Telehealth: Payer: Self-pay

## 2020-06-26 ENCOUNTER — Other Ambulatory Visit: Payer: Self-pay

## 2020-06-26 ENCOUNTER — Other Ambulatory Visit: Payer: Self-pay | Admitting: Cardiothoracic Surgery

## 2020-06-26 ENCOUNTER — Ambulatory Visit (INDEPENDENT_AMBULATORY_CARE_PROVIDER_SITE_OTHER): Payer: Self-pay | Admitting: Cardiothoracic Surgery

## 2020-06-26 VITALS — BP 115/72 | HR 73 | Temp 97.2°F | Resp 16 | Ht 71.0 in | Wt 166.0 lb

## 2020-06-26 DIAGNOSIS — Z951 Presence of aortocoronary bypass graft: Secondary | ICD-10-CM

## 2020-06-26 DIAGNOSIS — Z736 Limitation of activities due to disability: Secondary | ICD-10-CM

## 2020-06-26 NOTE — Progress Notes (Signed)
[address]       [place] [address]             [phone]                  Gregory Gardner [facility] Medical Record #[id number] Date of Birth: 06-11-1961  Referring SW:NIOEVO, [name], MD Primary Cardiology: Primary Care:[name], [name], MD  Chief Complaint:  Follow Up Visit OPERATIVE REPORT DATE OF PROCEDURE:  06/18/2020 PREOPERATIVE DIAGNOSIS:  Recurrent coronary artery disease. POSTOPERATIVE DIAGNOSIS:  Recurrent coronary artery disease. SURGICAL PROCEDURE:  Redo coronary artery bypass grafting x3 with the right internal mammary to the distal right coronary artery graft, sequential reverse saphenous vein graft to the diagonal, first obtuse marginal with left leg endovein harvesting.  History of Present Illness:      Patient returns to the office today after recent discharge from the hospital following redo coronary artery bypass grafting done August 18.  The patient comes in for wound check, it had a small amount of serous drainage from the lower incision.  He notes that since discharge this is continued to decrease but still a small amount.  He has had no fever chills     Social History   Tobacco Use  Smoking Status Never Smoker  Smokeless Tobacco Never Used       Allergies  Allergen Reactions  . Lodine [Etodolac] Nausea Only    Current Outpatient Medications  Medication Sig Dispense Refill  . acetaminophen (TYLENOL) 325 MG tablet Take 2 tablets (650 mg total) by mouth every 6 (six) hours as needed for mild pain.    . Albuterol Sulfate (VENTOLIN HFA IN) Inhale 1 puff into the lungs daily as needed (for wheezing).     Marland Kitchen alendronate (FOSAMAX) 70 MG tablet Take 70 mg by mouth once a week. Take with a full glass of water on an empty stomach.    Marland Kitchen aspirin EC 325 MG EC tablet Take 1 tablet (325 mg total) by mouth daily. 30 tablet 0  . atorvastatin (LIPITOR) 80 MG tablet Take 1 tablet (80 mg total) by mouth every evening. 30 tablet 3  .  cephALEXin (KEFLEX) 500 MG capsule Take 1 capsule (500 mg total) by mouth every 8 (eight) hours. 30 capsule 0  . cholecalciferol (VITAMIN D) 1000 UNITS tablet Take 1,000 Units by mouth daily.    . Choline Fenofibrate (FENOFIBRIC ACID) 135 MG CPDR Take 1 tablet by mouth daily.    . DULoxetine (CYMBALTA) 30 MG capsule Take 1 capsule (30 mg total) by mouth daily. 30 capsule 3  . DULoxetine (CYMBALTA) 30 MG capsule Take 1 capsule (30 mg total) by mouth daily. 30 capsule 2  . ezetimibe (ZETIA) 10 MG tablet Take 10 mg by mouth daily.    Marland Kitchen losartan (COZAAR) 25 MG tablet Take 1 tablet (25 mg total) by mouth daily. 30 tablet 3  . metoprolol tartrate (LOPRESSOR) 25 MG tablet Take 1 tablet (25 mg total) by mouth 2 (two) times daily. 60 tablet 3  . nitroGLYCERIN (NITROSTAT) 0.4 MG SL tablet Place 0.4 mg under the tongue every 5 (five) minutes as needed for chest pain.     Marland Kitchen ocrelizumab 300 mg in sodium chloride 0.9 % 250 mL Inject 300 mg into the vein every 6 (six) months.    Marland Kitchen omeprazole (PRILOSEC) 40 MG capsule Take 40 mg by mouth daily.    Marland Kitchen oxyCODONE (OXY IR/ROXICODONE) 5 MG immediate release tablet Take 1  tablet (5 mg total) by mouth every 4 (four) hours as needed for severe pain. 30 tablet 0  . guaiFENesin (MUCINEX) 600 MG 12 hr tablet Take 1 tablet (600 mg total) by mouth every 12 (twelve) hours as needed for cough. (Patient not taking: Reported on 06/26/2020)     No current facility-administered medications for this visit.       Physical Exam: BP 115/72 (BP Location: Left Arm, Patient Position: Sitting, Cuff Size: Normal)   Pulse 73   Temp (!) 97.2 F (36.2 C) (Temporal)   Resp 16   Ht 5\' 11"  (1.803 m)   Wt 166 lb (75.3 kg)   SpO2 100% Comment: RA  BMI 23.15 kg/m   General appearance: alert, cooperative and no distress Neurologic: intact Heart: regular rate and rhythm, S1, S2 normal, no murmur, click, rub or gallop Lungs: clear to auscultation bilaterally Abdomen: soft, non-tender;  bowel sounds normal; no masses,  no organomegaly Extremities: extremities normal, atraumatic, no cyanosis or edema Wound: Sternum is stable, very lower pole of the sternal incision has very small amount of serous drainage noted Wounds: Leg wound from vein harvest left leg is healing well  Diagnostic Studies & Laboratory data:         Recent Radiology Findings: No results found.   Recent Labs: Lab Results  Component Value Date   WBC 13.7 (H) 06/21/2020   HGB 10.9 (L) 06/21/2020   HCT 32.3 (L) 06/21/2020   PLT 112 (L) 06/21/2020   GLUCOSE 114 (H) 06/23/2020   CHOL 195 06/17/2020   TRIG 159 (H) 06/17/2020   HDL 44 06/17/2020   LDLCALC 119 (H) 06/17/2020   ALT 29 06/20/2020   AST 44 (H) 06/20/2020   NA 137 06/23/2020   K 4.0 06/23/2020   CL 103 06/23/2020   CREATININE 0.83 06/23/2020   BUN 16 06/23/2020   CO2 21 (L) 06/23/2020   INR 1.3 (H) 06/18/2020   HGBA1C 6.5 (H) 06/18/2020      Assessment / Plan:   Patient stable following recent redo coronary artery bypass grafting  Continue with local wound care at the lower incision, sternum is stable no evidence of infection  We will see the patient back in 1 week with a follow-up chest x-ray is aware to call immediately should he note any changes in her incision    Medication Changes: No orders of the defined types were placed in this encounter.    06/20/2020 06/26/2020 3:39 PM

## 2020-06-26 NOTE — Telephone Encounter (Signed)
Fmla form completed and faxed to [phone]/[employer] Beginning leave 06/16/20, RTW date will be 09/15/20 Attending Physician statement form completed for [employer] and faxed to [phone] per patient. All original forms mail to patient's home address

## 2020-07-02 ENCOUNTER — Other Ambulatory Visit: Payer: Self-pay | Admitting: Cardiothoracic Surgery

## 2020-07-02 DIAGNOSIS — Z951 Presence of aortocoronary bypass graft: Secondary | ICD-10-CM

## 2020-07-02 NOTE — Progress Notes (Signed)
[address]       [place] [address]             [phone]                  [name] [facility] Medical Record #[id number] Date of Birth: 06/27/1961  Referring EH:MCNOBS, [name], MD Primary Cardiology: Primary Care:[name], [name], MD  Chief Complaint:  Follow Up Visit OPERATIVE REPORT DATE OF PROCEDURE:  06/18/2020 PREOPERATIVE DIAGNOSIS:  Recurrent coronary artery disease. POSTOPERATIVE DIAGNOSIS:  Recurrent coronary artery disease. SURGICAL PROCEDURE:  Redo coronary artery bypass grafting x3 with the right internal mammary to the distal right coronary artery graft, sequential reverse saphenous vein graft to the diagonal, first obtuse marginal with left leg endovein harvesting.  History of Present Illness:     Patient returns to the office after recent coronary bypass grafting.  Early postoperatively had had some lower sternal drainage which is now completely healed.  Prior to coming in for bypass surgery he had been on Workmen's Comp. because of back injury he was to have MRI to evaluate this.  Independent of his back issue he would require 3 months before he returns to work because of lifting involved.  The patient had previous history of "fatty liver" with statins-he is to be referred to the lipid clinic currently he is agreeable to taking low-dose statin   Social History   Tobacco Use  Smoking Status Never Smoker  Smokeless Tobacco Never Used       Allergies  Allergen Reactions  . Lodine [Etodolac] Nausea Only    Current Outpatient Medications  Medication Sig Dispense Refill  . acetaminophen (TYLENOL) 325 MG tablet Take 2 tablets (650 mg total) by mouth every 6 (six) hours as needed for mild pain.    . Albuterol Sulfate (VENTOLIN HFA IN) Inhale 1 puff into the lungs daily as needed (for wheezing).     Marland Kitchen alendronate (FOSAMAX) 70 MG tablet Take 70 mg by mouth once a week. Take with a full glass of water on an empty stomach.    .  cholecalciferol (VITAMIN D) 1000 UNITS tablet Take 1,000 Units by mouth daily.    . DULoxetine (CYMBALTA) 30 MG capsule Take 1 capsule (30 mg total) by mouth daily. 30 capsule 2  . ezetimibe (ZETIA) 10 MG tablet Take 10 mg by mouth daily.    Marland Kitchen guaiFENesin (MUCINEX) 600 MG 12 hr tablet Take 1 tablet (600 mg total) by mouth every 12 (twelve) hours as needed for cough.    . losartan (COZAAR) 25 MG tablet Take 1 tablet (25 mg total) by mouth daily. 30 tablet 3  . metoprolol tartrate (LOPRESSOR) 25 MG tablet Take 1 tablet (25 mg total) by mouth 2 (two) times daily. 60 tablet 3  . nitroGLYCERIN (NITROSTAT) 0.4 MG SL tablet Place 0.4 mg under the tongue every 5 (five) minutes as needed for chest pain.     Marland Kitchen ocrelizumab 300 mg in sodium chloride 0.9 % 250 mL Inject 300 mg into the vein every 6 (six) months.    Marland Kitchen oxyCODONE (OXY IR/ROXICODONE) 5 MG immediate release tablet Take 1 tablet (5 mg total) by mouth every 4 (four) hours as needed for severe pain. 30 tablet 0  . aspirin EC 81 MG tablet Take 1 tablet (81 mg total) by mouth daily. Swallow whole. 30 tablet 11  . clopidogrel (PLAVIX) 75 MG tablet Take 1 tablet (75 mg total) by mouth daily. 90  tablet 3  . pantoprazole (PROTONIX) 40 MG tablet Take 1 tablet (40 mg total) by mouth daily. 30 tablet 2   No current facility-administered medications for this visit.       Physical Exam: BP 109/76   Pulse 67   Temp 97.7 F (36.5 C) (Skin)   Resp 20   Ht 5\' 11"  (1.803 m)   Wt 163 lb (73.9 kg)   SpO2 96% Comment: RA  BMI 22.73 kg/m   General appearance: alert, cooperative and no distress Neck: no adenopathy, no carotid bruit, no JVD, supple, symmetrical, trachea midline and thyroid not enlarged, symmetric, no tenderness/mass/nodules Resp: clear to auscultation bilaterally Cardio: regular rate and rhythm, S1, S2 normal, no murmur, click, rub or gallop GI: soft, non-tender; bowel sounds normal; no masses,  no organomegaly Extremities: extremities  normal, atraumatic, no cyanosis or edema and Homans sign is negative, no sign of DVT Neurologic: Grossly normal Sternal incision and left endoscopic vein harvesting sites are well-healed  Diagnostic Studies & Laboratory data:         Recent Radiology Findings: DG Chest 2 View  Result Date: 07/03/2020 CLINICAL DATA:  Incision site soreness following CABG EXAM: CHEST - 2 VIEW COMPARISON:  06/21/2020 FINDINGS: Post CABG changes including median sternotomy. Stable cardiomediastinal contours. Interval resolution of previously seen left basilar opacity. Possible trace residual left pleural effusion or pleural thickening, improved from prior. No focal airspace consolidation. No pneumothorax. IMPRESSION: Interval resolution of previously seen left basilar opacity. Electronically Signed   By: 06/23/2020 D.O.   On: 07/03/2020 09:04   I have independently reviewed the above radiology studies  and reviewed the findings with the patient.   ECHO INTRAOPERATIVE TEE  Result Date: 06/18/2020  *INTRAOPERATIVE TRANSESOPHAGEAL REPORT *  Patient Name:   Gregory Gardner Date of Exam: 06/18/2020 Medical Rec #:  06/20/2020        Height:       71.0 in Accession #:    [id number]       Weight:       168.1 lb Date of Birth:  1961/05/04        BSA:          1.96 m Patient Age:    59 years         BP:           106/72 mmHg Patient Gender: M                HR:           59 bpm. Exam Location:  Anesthesiology Transesophogeal exam was perform intraoperatively during surgical procedure. Patient was closely monitored under general anesthesia during the entirety of examination. Indications:     CAD                   Re-do CABG Performing Phys: [id number] Lakresha Stifter B Sheriann Newmann Complications: No known complications during this procedure. PRE-OP FINDINGS  Left Ventricle: The left ventricle has normal systolic function, with an ejection fraction of 55-60%. The cavity size was normal. There is no increase in left ventricular wall thickness.  Right Ventricle: The right ventricle has normal systolic function. The cavity was normal. There is no increase in right ventricular wall thickness. Left Atrium: Left atrial size was normal in size. The left atrial appendage is well visualized and there is no evidence of thrombus present. Right Atrium: Right atrial size was normal in size. Right atrial pressure is estimated at 10 mmHg. Interatrial Septum: No  atrial level shunt detected by color flow Doppler. Pericardium: There is no evidence of pericardial effusion. Mitral Valve: The mitral valve is normal in structure. Mild thickening of the mitral valve leaflet. Mitral valve regurgitation is mild by color flow Doppler. The MR jet is centrally-directed. There is No evidence of mitral stenosis. Tricuspid Valve: The tricuspid valve was normal in structure. Tricuspid valve regurgitation is mild-moderate by color flow Doppler. The jet is directed centrally. Aortic Valve: The aortic valve is tricuspid There is mild thickening of the aortic valve Aortic valve regurgitation was not visualized by color flow Doppler. There is no evidence of aortic valve stenosis. Pulmonic Valve: The pulmonic valve was normal in structure, with normal. Pulmonic valve regurgitation is trivial by color flow Doppler. Aorta: The aortic root and proximal ascending aorta were of normal size with a well defined sino-tubular junction without effacement. There was mild intimal thickening with no protruding atheromatous disease. The descending aorta was normal insize with grade 2 and 3 scattered atheromatous disease present.  Kipp Brood MD Electronically signed by Kipp Brood MD Signature Date/Time: 06/18/2020/9:23:33 PM    Final    VRecent Labs: Lab Results  Component Value Date   WBC 13.7 (H) 06/21/2020   HGB 10.9 (L) 06/21/2020   HCT 32.3 (L) 06/21/2020   PLT 112 (L) 06/21/2020   GLUCOSE 114 (H) 06/23/2020   CHOL 195 06/17/2020   TRIG 159 (H) 06/17/2020   HDL 44 06/17/2020   LDLCALC  119 (H) 06/17/2020   ALT 29 06/20/2020   AST 44 (H) 06/20/2020   NA 137 06/23/2020   K 4.0 06/23/2020   CL 103 06/23/2020   CREATININE 0.83 06/23/2020   BUN 16 06/23/2020   CO2 21 (L) 06/23/2020   INR 1.3 (H) 06/18/2020   HGBA1C 6.5 (H) 06/18/2020      Assessment / Plan:   #1 status post redo coronary artery bypass grafting August 18.  The patient is making good progress postoperatively his sternal incision is now well-healed without any further drainage.  He is increasing physical activity appropriately #2 to be referred to the lipid clinic for long-term management of hypercholesterolemia with history of intolerance of statins  Plan to see the patient back in 1 month     Medication Changes: No orders of the defined types were placed in this encounter.    Delight Ovens 07/15/2020 11:31 AM

## 2020-07-03 ENCOUNTER — Other Ambulatory Visit: Payer: Self-pay

## 2020-07-03 ENCOUNTER — Ambulatory Visit
Admission: RE | Admit: 2020-07-03 | Discharge: 2020-07-03 | Disposition: A | Discharge status: Home / Self Care | Payer: Managed Care, Other (non HMO) | Source: Ambulatory Visit | Attending: Cardiothoracic Surgery | Admitting: Cardiothoracic Surgery

## 2020-07-03 ENCOUNTER — Encounter: Payer: Self-pay | Admitting: *Deleted

## 2020-07-03 ENCOUNTER — Ambulatory Visit (INDEPENDENT_AMBULATORY_CARE_PROVIDER_SITE_OTHER): Payer: Self-pay | Admitting: Cardiothoracic Surgery

## 2020-07-03 VITALS — BP 109/76 | HR 67 | Temp 97.7°F | Resp 20 | Ht 71.0 in | Wt 163.0 lb

## 2020-07-03 DIAGNOSIS — Z951 Presence of aortocoronary bypass graft: Secondary | ICD-10-CM

## 2020-07-07 NOTE — Progress Notes (Signed)
Cardiology Office Note:    Date:  07/08/2020   ID:  Gregory Gardner, DOB January 01, 1961, MRN [id number]  PCP:  Caffie Damme, MD  [facility] Cardiologist:  Charlton Haws, MD  [facility] Electrophysiologist:  None   Referring MD: Caffie Damme, MD   Chief Complaint:  No chief complaint on file.    Patient Profile:    Gregory Gardner is a 59 y.o. male with:   Coronary artery disease   S/p Inf STEMI >> CABG in 2003  S/p NSTEMI 8/21 >> s/p redo CABG  Ex-smoker  Multiple sclerosis  Hyperlipidemia   Intol of statins (?elevated LFTs)  Prior CV studies:  Echocardiogram 06/17/2020 EF 40-45, anterior/anterolateral HK, moderate asymmetric LVH, GR 1 DD, normal RVSF, RVSP 23.2  Carotid US 06/17/2020 Bilateral ICA 1-39  Cardiac catheterization 06/16/2020  Severe disease in the sequential saphenous vein graft to the diagonal and obtuse marginal. The continuation to the obtuse marginal is totally occluded. Proximal to mid vessel contains shaggy 99% regions of stenosis. The marginal fills by collaterals.  Severe disease in the sequential saphenous vein graft to the PDA and second left ventricular branch with occlusion of the distal anastomosis to the left ventricular branch. The ostial to proximal graft contains 75% stenosis. The insertion site into the PDA is 70% narrowed. The proximal and distal PDA at the insertion site contains high-grade stenosis. Second and third left ventricular branches fill by collaterals.  Patent LIMA to LAD.  Left main is patent  Proximal LAD is totally occluded  Circumflex is ostially occluded  Native RCA is distally occluded  Anterior wall moderate hypokinesis. EF 40 to 50%. LVEDP is normal.  Myoview 11/09/2018 EF 72, normal perfusion, low risk   History of Present Illness:    Gregory Gardner was last seen in clinic in 3/21 by Nada Boozer, NP.    He was admitted 8/16-8/24 with a non-STEMI.  Cardiac catheterization demonstrated severe disease in  the vein graft to the diagonal and obtuse marginal as well as severe disease in the vein graft to the PDA and second LV branch.  The LIMA-LAD was patent.  The LAD, LCx and RCA were all occluded.  EF was 40-45 by echocardiogram.  He was referred for redo bypass surgery.  This was performed by Dr. Tyrone Sage (SVG-DX/OM, RIMA-distal SVG-PDA).  His postoperative course was fairly uneventful.  He did have some drainage from his sternal wound which was managed with cephalexin.  He returns for follow-up.    He is here alone.  Since discharge from the hospital, he has been doing well.  His chest is still sore.  He is increasing his activity.  He has not had significant shortness of breath.  He has to clear his throat a lot and has been sitting up in a recliner at night due to this.  He has not had significant leg swelling.      Past Medical History:  Diagnosis Date  . CAD (coronary artery disease)    s/p Inf STEMI >> S/p CABG in 2003 // Myoview 11/2018: low risk // s/p NSTEMI 8/21 >> s/p redo CABG  . Ischemic cardiomyopathy    Echo 8/21: EF 40-45, anterior/anterolateral HK, moderate asymmetric LVH, GR 1 DD, normal RVSF, RVSP 23.2  . Mixed hyperlipidemia    Intol of statins (?elevated LFTs)  . MRSA (methicillin resistant Staphylococcus aureus)   . Multiple sclerosis (HCC)     Current Medications: Current Meds  Medication Sig  . acetaminophen (TYLENOL) 325 MG  tablet Take 2 tablets (650 mg total) by mouth every 6 (six) hours as needed for mild pain.  . Albuterol Sulfate (VENTOLIN HFA IN) Inhale 1 puff into the lungs daily as needed (for wheezing).   Marland Kitchen alendronate (FOSAMAX) 70 MG tablet Take 70 mg by mouth once a week. Take with a full glass of water on an empty stomach.  . cholecalciferol (VITAMIN D) 1000 UNITS tablet Take 1,000 Units by mouth daily.  . DULoxetine (CYMBALTA) 30 MG capsule Take 1 capsule (30 mg total) by mouth daily.  Marland Kitchen ezetimibe (ZETIA) 10 MG tablet Take 10 mg by mouth daily.  Marland Kitchen  guaiFENesin (MUCINEX) 600 MG 12 hr tablet Take 1 tablet (600 mg total) by mouth every 12 (twelve) hours as needed for cough.  . losartan (COZAAR) 25 MG tablet Take 1 tablet (25 mg total) by mouth daily.  . metoprolol tartrate (LOPRESSOR) 25 MG tablet Take 1 tablet (25 mg total) by mouth 2 (two) times daily.  . nitroGLYCERIN (NITROSTAT) 0.4 MG SL tablet Place 0.4 mg under the tongue every 5 (five) minutes as needed for chest pain.   Marland Kitchen ocrelizumab 300 mg in sodium chloride 0.9 % 250 mL Inject 300 mg into the vein every 6 (six) months.  Marland Kitchen oxyCODONE (OXY IR/ROXICODONE) 5 MG immediate release tablet Take 1 tablet (5 mg total) by mouth every 4 (four) hours as needed for severe pain.  . [DISCONTINUED] aspirin EC 325 MG EC tablet Take 1 tablet (325 mg total) by mouth daily.  . [DISCONTINUED] atorvastatin (LIPITOR) 80 MG tablet Take 1 tablet (80 mg total) by mouth every evening.  . [DISCONTINUED] Choline Fenofibrate (FENOFIBRIC ACID) 135 MG CPDR Take 1 tablet by mouth daily.  . [DISCONTINUED] omeprazole (PRILOSEC) 40 MG capsule Take 40 mg by mouth daily.     Allergies:   Lodine [etodolac]   Social History   Tobacco Use  . Smoking status: Never Smoker  . Smokeless tobacco: Never Used  Substance Use Topics  . Alcohol use: Yes  . Drug use: No     Family Hx: The patient's family history includes Heart Problems in his father.  ROS   EKGs/Labs/Other Test Reviewed:    EKG:  EKG is  ordered today.  The ekg ordered today demonstrates this normal sinus rhythm, heart rate 60, normal axis, T wave inversions 1, aVL, V3-V6, QTC 434  Recent Labs: 06/19/2020: Magnesium 2.3 06/20/2020: ALT 29 06/21/2020: Hemoglobin 10.9; Platelets 112 06/23/2020: BUN 16; Creatinine, Ser 0.83; Potassium 4.0; Sodium 137   Recent Lipid Panel Lab Results  Component Value Date/Time   CHOL 195 06/17/2020 04:21 AM   TRIG 159 (H) 06/17/2020 04:21 AM   HDL 44 06/17/2020 04:21 AM   CHOLHDL 4.4 06/17/2020 04:21 AM   LDLCALC 119  (H) 06/17/2020 04:21 AM    Physical Exam:    VS:  BP 110/62   Pulse 60   Ht 5\' 11"  (1.803 m)   Wt 160 lb 9.6 oz (72.8 kg)   SpO2 96%   BMI 22.40 kg/m     Wt Readings from Last 3 Encounters:  07/08/20 160 lb 9.6 oz (72.8 kg)  07/03/20 163 lb (73.9 kg)  06/26/20 166 lb (75.3 kg)     Constitutional:      Appearance: Healthy appearance. Not in distress.  Neck:     Vascular: JVD normal.  Pulmonary:     Effort: Pulmonary effort is normal.     Breath sounds: No wheezing. No rales.  Chest:  Comments: Median sternotomy well-healed without erythema or discharge Cardiovascular:     Normal rate. Regular rhythm. Normal S1. Normal S2.     Murmurs: There is no murmur.  Edema:    Peripheral edema absent.  Abdominal:     Palpations: Abdomen is soft.  Skin:    General: Skin is warm and dry.  Neurological:     General: No focal deficit present.     Mental Status: Alert and oriented to person, place and time.     Cranial Nerves: Cranial nerves are intact.      ASSESSMENT & PLAN:    1. Non-ST elevation (NSTEMI) myocardial infarction [facility]) History of prior CABG in 2003 after an inferior STEMI.  He recently presented with a non-STEMI and underwent redo bypass with Dr. Tyrone Sage.  His postoperative course was fairly uneventful.  He has been doing fairly well since discharge from the hospital.  Since he did present with ACS, he should be on dual antiplatelet therapy for 1 year.  I will adjust his aspirin to 81 mg and place him on clopidogrel.  He has had difficulty with statins in the past.  It sounds like he has had elevated LFTs.  I will have him remain off of atorvastatin and fenofibrate,  continue ezetimibe and refer him to the lipid clinic.  I have encouraged him to start cardiac rehabilitation.  -Decrease aspirin 81 mg daily  -Start clopidogrel 75 mg daily  -Continue losartan, Toprol tartrate, ezetimibe  -Okay to start cardiac rehabilitation  -Follow-up with Dr. Eden Emms in 3  months   2. Ischemic cardiomyopathy EF 40-45.  NYHA II.  Volume status appears stable.  He is not on diuretic therapy.  Continue beta-blocker, ARB.  We discussed the importance of daily weights and when to contact us, if needed.  3. Mixed hyperlipidemia Continue ezetimibe as noted above.  I will refer him to the lipid clinic to consider PCSK9 inhibitor therapy.  He will remain off of atorvastatin and fenofibrate for now.  4.  GERD As he is being placed on Clopidogrel, I will change his omeprazole to pantoprazole.  He can get future refills with primary care.    Dispo:  Return in about 3 months (around 10/07/2020) for Routine Follow Up w/ Dr. Eden Emms, in person.   Medication Adjustments/Labs and Tests Ordered: Current medicines are reviewed at length with the patient today.  Concerns regarding medicines are outlined above.  Tests Ordered: Orders Placed This Encounter  Procedures  . AMB Referral to [facility]  . EKG 12-Lead   Medication Changes: Meds ordered this encounter  Medications  . aspirin EC 81 MG tablet    Sig: Take 1 tablet (81 mg total) by mouth daily. Swallow whole.    Dispense:  30 tablet    Refill:  11    Order Specific Question:   Supervising Provider    Answer:   Lewayne Bunting [[id number]]  . clopidogrel (PLAVIX) 75 MG tablet    Sig: Take 1 tablet (75 mg total) by mouth daily.    Dispense:  90 tablet    Refill:  3    Order Specific Question:   Supervising Provider    Answer:   Lewayne Bunting [[id number]]  . pantoprazole (PROTONIX) 40 MG tablet    Sig: Take 1 tablet (40 mg total) by mouth daily.    Dispense:  30 tablet    Refill:  2    Future refills to be obtained from PCP  Order Specific Question:   Supervising Provider    Answer:   Lewayne Bunting [[id number]]    Signed, Tereso Newcomer, PA-C  07/08/2020 1:23 PM    [facility] [address] [place], [place], [place]  [address] Phone: [phone]; Fax: [phone]

## 2020-07-08 ENCOUNTER — Other Ambulatory Visit: Payer: Self-pay

## 2020-07-08 ENCOUNTER — Encounter: Payer: Self-pay | Admitting: Physician Assistant

## 2020-07-08 ENCOUNTER — Ambulatory Visit (INDEPENDENT_AMBULATORY_CARE_PROVIDER_SITE_OTHER): Payer: Managed Care, Other (non HMO) | Admitting: Physician Assistant

## 2020-07-08 VITALS — BP 110/62 | HR 60 | Ht 71.0 in | Wt 160.6 lb

## 2020-07-08 DIAGNOSIS — K219 Gastro-esophageal reflux disease without esophagitis: Secondary | ICD-10-CM

## 2020-07-08 DIAGNOSIS — E782 Mixed hyperlipidemia: Secondary | ICD-10-CM

## 2020-07-08 DIAGNOSIS — I255 Ischemic cardiomyopathy: Secondary | ICD-10-CM | POA: Diagnosis not present

## 2020-07-08 DIAGNOSIS — I214 Non-ST elevation (NSTEMI) myocardial infarction: Secondary | ICD-10-CM

## 2020-07-08 DIAGNOSIS — I2510 Atherosclerotic heart disease of native coronary artery without angina pectoris: Secondary | ICD-10-CM | POA: Insufficient documentation

## 2020-07-08 MED ORDER — PANTOPRAZOLE SODIUM 40 MG PO TBEC: 40.0000 mg | DELAYED_RELEASE_TABLET | Freq: Every day | ORAL | 2 refills | Status: AC

## 2020-07-08 MED ORDER — ASPIRIN EC 81 MG PO TBEC: 81.0000 mg | DELAYED_RELEASE_TABLET | Freq: Every day | ORAL | 11 refills | Status: DC

## 2020-07-08 MED ORDER — CLOPIDOGREL BISULFATE 75 MG PO TABS: 75.0000 mg | ORAL_TABLET | Freq: Every day | ORAL | 3 refills | Status: AC

## 2020-07-08 NOTE — Patient Instructions (Addendum)
Medication Instructions:  Your physician has recommended you make the following change in your medication:   1) Stay off Atorvastatin and Fenofibrate 2) Keep taking Zetia 10 mg, 1 tablet by mouth once a day 3) Stop Omeprazole 4) Start Protonix 40 mg, 1 tablet by mouth once a day 5) Start Plavix 75 mg, 1 tablet by mouth once a day 6) Decrease Aspirin to 81 mg, 1 tablet by mouth once a day  *If you need a refill on your cardiac medications before your next appointment, please call your pharmacy* *Check medication list and let us know if you anything else is incorrect*  Lab Work: None ordered today  Testing/Procedures: None ordered today  Follow-Up: On 10/07/20 at 8:00AM with Charlton Haws, MD  Other Instructions We will refer you to the lipid clinic for your cholesterol

## 2020-07-17 ENCOUNTER — Ambulatory Visit: Payer: Managed Care, Other (non HMO) | Admitting: Cardiothoracic Surgery

## 2020-07-20 NOTE — Progress Notes (Signed)
Patient ID: Gregory Gardner                 DOB: 1961/11/01                    MRN: [id number]     HPI: Gregory Gardner is a 59 y.o. male patient of Dr. Fabio Bering referred to lipid clinic by Tereso Newcomer, PA. PMH is significant for CAD s/p CABG in 2003 and re-do CABG on 06/16/20, HLD w/ intolerance to statins due to elevated LFTs, ischemic cardiomyopathy with EF 40-45%, and MS.  Patient arrives today in good spirits. He states that he has only tried atorvastatin in the past and that it gave him "fatty liver." He has been on ezetimibe for years and is tolerating it well. He is willing to try a different statin to help lower his cholesterol. He is a [employer] and eats out at [employer] often since he is out on the road a lot. Stays active by mowing the lawn and walking to his mailbox.  Current Medications: ezetimibe 10 mg daily Intolerances: atorvastatin 80 mg daily, fenofibrate 160 mg daily Risk Factors: ASCVD, HLD, former smoker, family history LDL goal: <55 mg/dL  Diet: Does not eat breakfast most days but will have eggs or cereal at times. Enjoys grilled foods such as grilled fish or burgers. Eats at [employer] often.  Exercise:  Mowing the lawn, walking to mail box, other yard work  Family History: cardiac problems in father; sister and mother has a pacemaker  Social History: never smoker  Labs: 06/17/20: TC 195, HDL 44, TG 159, LDL 119 (ezetimibe 10mg  daily) 06/20/20: AST 44, ALT 29  Past Medical History:  Diagnosis Date  . CAD (coronary artery disease)    s/p Inf STEMI >> S/p CABG in 2003 // Myoview 11/2018: low risk // s/p NSTEMI 8/21 >> s/p redo CABG  . Ischemic cardiomyopathy    Echo 8/21: EF 40-45, anterior/anterolateral HK, moderate asymmetric LVH, GR 1 DD, normal RVSF, RVSP 23.2  . Mixed hyperlipidemia    Intol of statins (?elevated LFTs)  . MRSA (methicillin resistant Staphylococcus aureus)   . Multiple sclerosis (HCC)     Current Outpatient  Medications on File Prior to Visit  Medication Sig Dispense Refill  . acetaminophen (TYLENOL) 325 MG tablet Take 2 tablets (650 mg total) by mouth every 6 (six) hours as needed for mild pain.    . Albuterol Sulfate (VENTOLIN HFA IN) Inhale 1 puff into the lungs daily as needed (for wheezing).     9/21 alendronate (FOSAMAX) 70 MG tablet Take 70 mg by mouth once a week. Take with a full glass of water on an empty stomach.    Marland Kitchen aspirin EC 81 MG tablet Take 1 tablet (81 mg total) by mouth daily. Swallow whole. 30 tablet 11  . cholecalciferol (VITAMIN D) 1000 UNITS tablet Take 1,000 Units by mouth daily.    . clopidogrel (PLAVIX) 75 MG tablet Take 1 tablet (75 mg total) by mouth daily. 90 tablet 3  . DULoxetine (CYMBALTA) 30 MG capsule Take 1 capsule (30 mg total) by mouth daily. 30 capsule 2  . ezetimibe (ZETIA) 10 MG tablet Take 10 mg by mouth daily.    Marland Kitchen guaiFENesin (MUCINEX) 600 MG 12 hr tablet Take 1 tablet (600 mg total) by mouth every 12 (twelve) hours as needed for cough.    . losartan (COZAAR) 25 MG tablet Take 1 tablet (25 mg total) by  mouth daily. 30 tablet 3  . metoprolol tartrate (LOPRESSOR) 25 MG tablet Take 1 tablet (25 mg total) by mouth 2 (two) times daily. 60 tablet 3  . nitroGLYCERIN (NITROSTAT) 0.4 MG SL tablet Place 0.4 mg under the tongue every 5 (five) minutes as needed for chest pain.     Marland Kitchen ocrelizumab 300 mg in sodium chloride 0.9 % 250 mL Inject 300 mg into the vein every 6 (six) months.    Marland Kitchen oxyCODONE (OXY IR/ROXICODONE) 5 MG immediate release tablet Take 1 tablet (5 mg total) by mouth every 4 (four) hours as needed for severe pain. 30 tablet 0  . pantoprazole (PROTONIX) 40 MG tablet Take 1 tablet (40 mg total) by mouth daily. 30 tablet 2   No current facility-administered medications on file prior to visit.    Allergies  Allergen Reactions  . Lodine [Etodolac] Nausea Only    Assessment/Plan:  1. Hyperlipidemia - LDL is elevated and above goal of <55 mg/dL. Baseline  LFTs during admission were WNL. Start rosuvastatin 20 mg daily and continue ezetimibe 10 mg daily. Can consider adding PCSK9 injections if additional cholesterol-lowering medication required. Educated patient on the importance of regular exercise and a cardiac-healthy diet. Encouraged pt to avoid eating at fast food restaurants as able. Follow-up fasting labs in 6 weeks on 09/01/20.   Patient requests changing his metoprolol tartrate to succinate in order to decrease his pill burden. He was originally taking metoprolol succinate but was changed to tartrate post-discharge after his procedure. HR today is controlled at 65 bpm. Will stop metoprolol tartrate and change to metoprolol succinate 50 mg daily.  Tama Headings, PharmD PGY2 Cardiology Pharmacy Resident

## 2020-07-21 ENCOUNTER — Other Ambulatory Visit: Payer: Self-pay

## 2020-07-21 ENCOUNTER — Ambulatory Visit (INDEPENDENT_AMBULATORY_CARE_PROVIDER_SITE_OTHER): Payer: Managed Care, Other (non HMO) | Admitting: Pharmacist

## 2020-07-21 VITALS — HR 65

## 2020-07-21 DIAGNOSIS — E782 Mixed hyperlipidemia: Secondary | ICD-10-CM | POA: Diagnosis not present

## 2020-07-21 DIAGNOSIS — I10 Essential (primary) hypertension: Secondary | ICD-10-CM | POA: Diagnosis not present

## 2020-07-21 MED ORDER — METOPROLOL SUCCINATE ER 50 MG PO TB24: 50.0000 mg | ORAL_TABLET | Freq: Every day | ORAL | 3 refills | Status: DC

## 2020-07-21 MED ORDER — ROSUVASTATIN CALCIUM 20 MG PO TABS: 20.0000 mg | ORAL_TABLET | Freq: Every day | ORAL | 11 refills | Status: DC

## 2020-07-21 NOTE — Patient Instructions (Addendum)
It was nice to meet you today   Your LDL is 119 and your goal is < 55  START taking rosuvastatin 20 mg daily  Continue taking ezetimibe 10 mg daily  Stop taking metoprolol TARTRATE (twice daily) and start taking metoprolol SUCCINATE 50 mg once daily   We will plan to recheck your cholesterol in 6 weeks on 09/01/20. Please make sure you are fasting.  Please call [phone] if you have any questions.

## 2020-08-06 ENCOUNTER — Other Ambulatory Visit: Payer: Self-pay | Admitting: Cardiothoracic Surgery

## 2020-08-06 DIAGNOSIS — Z951 Presence of aortocoronary bypass graft: Secondary | ICD-10-CM

## 2020-08-07 ENCOUNTER — Ambulatory Visit
Admission: RE | Admit: 2020-08-07 | Discharge: 2020-08-07 | Disposition: A | Discharge status: Home / Self Care | Payer: Managed Care, Other (non HMO) | Source: Ambulatory Visit | Attending: Cardiothoracic Surgery | Admitting: Cardiothoracic Surgery

## 2020-08-07 ENCOUNTER — Ambulatory Visit (INDEPENDENT_AMBULATORY_CARE_PROVIDER_SITE_OTHER): Payer: Self-pay | Admitting: Cardiothoracic Surgery

## 2020-08-07 ENCOUNTER — Encounter: Payer: Self-pay | Admitting: Cardiothoracic Surgery

## 2020-08-07 ENCOUNTER — Other Ambulatory Visit: Payer: Self-pay

## 2020-08-07 VITALS — BP 139/80 | HR 53 | Temp 97.5°F | Resp 20 | Wt 161.0 lb

## 2020-08-07 DIAGNOSIS — Z951 Presence of aortocoronary bypass graft: Secondary | ICD-10-CM

## 2020-08-07 NOTE — Progress Notes (Signed)
[address]       [place] [address]             [phone]                  Gregory Gardner [facility] Medical Record #[id number] Date of Birth: [birth year]  Referring OH:[id number], Gregory Pick, MD Primary Cardiology: Primary Care:Gardner, Gregory Compton, MD  Chief Complaint:  Follow Up Visit OPERATIVE REPORT DATE OF PROCEDURE:  06/18/2020 PREOPERATIVE DIAGNOSIS:  Recurrent coronary artery disease. POSTOPERATIVE DIAGNOSIS:  Recurrent coronary artery disease. SURGICAL PROCEDURE:  Redo coronary artery bypass grafting x3 with the right internal mammary to the distal right coronary artery graft, sequential reverse saphenous vein graft to the diagonal, first obtuse marginal with left leg endovein harvesting.  History of Present Illness:     Patient returns to the office after recent coronary bypass grafting.  Patient continues to make good progress. Independent of his back issue he would require 3 months before he returns to work because of lifting 75 pounds return is required  The patient had previous history of "fatty liver" with statins-he is to be referred to the lipid clinic currently he is agreeable to taking low-dose statin   Social History   Tobacco Use  Smoking Status Never Smoker  Smokeless Tobacco Never Used       Allergies  Allergen Reactions  . Lodine [Etodolac] Nausea Only    Current Outpatient Medications  Medication Sig Dispense Refill  . acetaminophen (TYLENOL) 325 MG tablet Take 2 tablets (650 mg total) by mouth every 6 (six) hours as needed for mild pain.    . Albuterol Sulfate (VENTOLIN HFA IN) Inhale 1 puff into the lungs daily as needed (for wheezing).     Marland Kitchen alendronate (FOSAMAX) 70 MG tablet Take 70 mg by mouth once a week. Take with a full glass of water on an empty stomach.    Marland Kitchen aspirin EC 81 MG tablet Take 1 tablet (81 mg total) by mouth daily. Swallow whole. 30 tablet 11  . cholecalciferol (VITAMIN D) 1000 UNITS tablet Take 1,000 Units  by mouth daily.    . clopidogrel (PLAVIX) 75 MG tablet Take 1 tablet (75 mg total) by mouth daily. 90 tablet 3  . DULoxetine (CYMBALTA) 30 MG capsule Take 1 capsule (30 mg total) by mouth daily. 30 capsule 2  . ezetimibe (ZETIA) 10 MG tablet Take 10 mg by mouth daily.    Marland Kitchen guaiFENesin (MUCINEX) 600 MG 12 hr tablet Take 1 tablet (600 mg total) by mouth every 12 (twelve) hours as needed for cough.    . losartan (COZAAR) 25 MG tablet Take 1 tablet (25 mg total) by mouth daily. 30 tablet 3  . metoprolol succinate (TOPROL-XL) 50 MG 24 hr tablet Take 1 tablet (50 mg total) by mouth daily. Take with or immediately following a meal. 90 tablet 3  . nitroGLYCERIN (NITROSTAT) 0.4 MG SL tablet Place 0.4 mg under the tongue every 5 (five) minutes as needed for chest pain.     Marland Kitchen ocrelizumab 300 mg in sodium chloride 0.9 % 250 mL Inject 300 mg into the vein every 6 (six) months.    . pantoprazole (PROTONIX) 40 MG tablet Take 1 tablet (40 mg total) by mouth daily. 30 tablet 2  . rosuvastatin (CRESTOR) 20 MG tablet Take 1 tablet (20 mg total) by mouth daily. 30 tablet 11   No current facility-administered medications for this visit.  Physical Exam: BP 139/80 (BP Location: Left Arm, Patient Position: Sitting, Cuff Size: Normal)   Pulse (!) 53   Temp (!) 97.5 F (36.4 C) (Skin)   Resp 20   Wt 161 lb (73 kg)   SpO2 99% Comment: RA  BMI 22.45 kg/m   General appearance: alert and cooperative Resp: clear to auscultation bilaterally Cardio: regular rate and rhythm, S1, S2 normal, no murmur, click, rub or gallop GI: soft, non-tender; bowel sounds normal; no masses,  no organomegaly Extremities: extremities normal, atraumatic, no cyanosis or edema and Homans sign is negative, no sign of DVT Neurologic: Grossly normal  Sternum is stable well-healed  Diagnostic Studies & Laboratory data:         Recent Radiology Findings: DG Chest 2 View  Result Date: 07/03/2020 CLINICAL DATA:  Incision site  soreness following CABG EXAM: CHEST - 2 VIEW COMPARISON:  06/21/2020 FINDINGS: Post CABG changes including median sternotomy. Stable cardiomediastinal contours. Interval resolution of previously seen left basilar opacity. Possible trace residual left pleural effusion or pleural thickening, improved from prior. No focal airspace consolidation. No pneumothorax. IMPRESSION: Interval resolution of previously seen left basilar opacity. Electronically Signed   By: Duanne Guess D.O.   On: 07/03/2020 09:04   I have independently reviewed the above radiology studies  and reviewed the findings with the patient.   ECHO INTRAOPERATIVE TEE  Result Date: 06/18/2020  *INTRAOPERATIVE TRANSESOPHAGEAL REPORT *  Patient Name:   Gregory Gardner Date of Exam: 06/18/2020 Medical Rec #:  [id number]        Height:       71.0 in Accession #:    [id number]       Weight:       168.1 lb Date of Birth:  Mar 02, 1961        BSA:          1.96 m Patient Age:    59 years         BP:           106/72 mmHg Patient Gender: M                HR:           59 bpm. Exam Location:  Anesthesiology Transesophogeal exam was perform intraoperatively during surgical procedure. Patient was closely monitored under general anesthesia during the entirety of examination. Indications:     CAD                   Re-do CABG Performing Phys: [id number] Chief Walkup B Janeshia Ciliberto Complications: No known complications during this procedure. PRE-OP FINDINGS  Left Ventricle: The left ventricle has normal systolic function, with an ejection fraction of 55-60%. The cavity size was normal. There is no increase in left ventricular wall thickness. Right Ventricle: The right ventricle has normal systolic function. The cavity was normal. There is no increase in right ventricular wall thickness. Left Atrium: Left atrial size was normal in size. The left atrial appendage is well visualized and there is no evidence of thrombus present. Right Atrium: Right atrial size was normal in size.  Right atrial pressure is estimated at 10 mmHg. Interatrial Septum: No atrial level shunt detected by color flow Doppler. Pericardium: There is no evidence of pericardial effusion. Mitral Valve: The mitral valve is normal in structure. Mild thickening of the mitral valve leaflet. Mitral valve regurgitation is mild by color flow Doppler. The MR jet is centrally-directed. There is No evidence of mitral stenosis. Tricuspid Valve: The tricuspid  valve was normal in structure. Tricuspid valve regurgitation is mild-moderate by color flow Doppler. The jet is directed centrally. Aortic Valve: The aortic valve is tricuspid There is mild thickening of the aortic valve Aortic valve regurgitation was not visualized by color flow Doppler. There is no evidence of aortic valve stenosis. Pulmonic Valve: The pulmonic valve was normal in structure, with normal. Pulmonic valve regurgitation is trivial by color flow Doppler. Aorta: The aortic root and proximal ascending aorta were of normal size with a well defined sino-tubular junction without effacement. There was mild intimal thickening with no protruding atheromatous disease. The descending aorta was normal insize with grade 2 and 3 scattered atheromatous disease present.  Kipp Brood MD Electronically signed by Kipp Brood MD Signature Date/Time: 06/18/2020/9:23:33 PM    Final    VRecent Labs: Lab Results  Component Value Date   WBC 13.7 (H) 06/21/2020   HGB 10.9 (L) 06/21/2020   HCT 32.3 (L) 06/21/2020   PLT 112 (L) 06/21/2020   GLUCOSE 114 (H) 06/23/2020   CHOL 195 06/17/2020   TRIG 159 (H) 06/17/2020   HDL 44 06/17/2020   LDLCALC 119 (H) 06/17/2020   ALT 29 06/20/2020   AST 44 (H) 06/20/2020   NA 137 06/23/2020   K 4.0 06/23/2020   CL 103 06/23/2020   CREATININE 0.83 06/23/2020   BUN 16 06/23/2020   CO2 21 (L) 06/23/2020   INR 1.3 (H) 06/18/2020   HGBA1C 6.5 (H) 06/18/2020      Assessment / Plan:   #1 status post acute myocardial infarction and redo  coronary artery bypass grafting-patient is making good progress postoperatively.  He could return to work if he is not required to lift over 15 to 20 pounds for 2 months  Plan to see back in 6 weeks  Medication Changes: No orders of the defined types were placed in this encounter.    Delight Ovens 08/07/2020 4:33 PM

## 2020-08-15 ENCOUNTER — Telehealth: Payer: Self-pay | Admitting: Cardiovascular Disease

## 2020-08-15 NOTE — Telephone Encounter (Signed)
Patient made a cash payment 08/15/20. FMLA forms placed in Dr. Fabio Bering Box.JC

## 2020-08-22 NOTE — Telephone Encounter (Signed)
MR Dept Received FMLA Paperwork back from [facility]. The patient will need to have Dr.Edward [name] to complete the form.The doctor's here did not take him out of work. Patient is aware he need  pick up forms and cash  Payment.

## 2020-09-01 ENCOUNTER — Other Ambulatory Visit: Payer: Managed Care, Other (non HMO) | Admitting: *Deleted

## 2020-09-01 ENCOUNTER — Other Ambulatory Visit: Payer: Self-pay

## 2020-09-01 DIAGNOSIS — E782 Mixed hyperlipidemia: Secondary | ICD-10-CM

## 2020-09-01 LAB — LIPID PANEL
Chol/HDL Ratio: 2.8 ratio (ref 0.0–5.0)
Cholesterol, Total: 135 mg/dL (ref 100–199)
HDL: 48 mg/dL (ref >39)
LDL Chol Calc (NIH): 57 mg/dL (ref 0–99)
Triglycerides: 179 mg/dL — ABNORMAL HIGH (ref 0–149)
VLDL Cholesterol Cal: 30 mg/dL (ref 5–40)

## 2020-09-01 NOTE — Progress Notes (Signed)
Patient saw lipid clinic on 07/21/20 and needs lipid panel today to recheck cholesterol. Placed order.

## 2020-09-18 ENCOUNTER — Ambulatory Visit: Payer: Managed Care, Other (non HMO) | Admitting: Cardiothoracic Surgery

## 2020-09-23 ENCOUNTER — Ambulatory Visit: Payer: Managed Care, Other (non HMO) | Admitting: Cardiothoracic Surgery

## 2020-09-23 ENCOUNTER — Other Ambulatory Visit: Payer: Managed Care, Other (non HMO)

## 2020-09-23 NOTE — Progress Notes (Deleted)
[address]       [place] [address]             [phone]                  Gregory Gardner [facility] Medical Record #[id number] Date of Birth: 09-Dec-1960  Referring OZ:DGUYQI, [name], MD Primary Cardiology: Primary Care:[name], [name], MD  Chief Complaint:  Follow Up Visit OPERATIVE REPORT DATE OF PROCEDURE:  06/18/2020 PREOPERATIVE DIAGNOSIS:  Recurrent coronary artery disease. POSTOPERATIVE DIAGNOSIS:  Recurrent coronary artery disease. SURGICAL PROCEDURE:  Redo coronary artery bypass grafting x3 with the right internal mammary to the distal right coronary artery graft, sequential reverse saphenous vein graft to the diagonal, first obtuse marginal with left leg endovein harvesting.  History of Present Illness:       The patient had previous history of "fatty liver" with statins-he is to be referred to the lipid clinic currently he is agreeable to taking low-dose statin   Social History   Tobacco Use  Smoking Status Never Smoker  Smokeless Tobacco Never Used       Allergies  Allergen Reactions  . Lodine [Etodolac] Nausea Only    Current Outpatient Medications  Medication Sig Dispense Refill  . acetaminophen (TYLENOL) 325 MG tablet Take 2 tablets (650 mg total) by mouth every 6 (six) hours as needed for mild pain.    . Albuterol Sulfate (VENTOLIN HFA IN) Inhale 1 puff into the lungs daily as needed (for wheezing).     Marland Kitchen alendronate (FOSAMAX) 70 MG tablet Take 70 mg by mouth once a week. Take with a full glass of water on an empty stomach.    Marland Kitchen aspirin EC 81 MG tablet Take 1 tablet (81 mg total) by mouth daily. Swallow whole. 30 tablet 11  . cholecalciferol (VITAMIN D) 1000 UNITS tablet Take 1,000 Units by mouth daily.    . clopidogrel (PLAVIX) 75 MG tablet Take 1 tablet (75 mg total) by mouth daily. 90 tablet 3  . DULoxetine (CYMBALTA) 30 MG capsule Take 1 capsule (30 mg total) by mouth daily. 30 capsule 2  . ezetimibe (ZETIA) 10 MG  tablet Take 10 mg by mouth daily.    Marland Kitchen guaiFENesin (MUCINEX) 600 MG 12 hr tablet Take 1 tablet (600 mg total) by mouth every 12 (twelve) hours as needed for cough.    . losartan (COZAAR) 25 MG tablet Take 1 tablet (25 mg total) by mouth daily. 30 tablet 3  . metoprolol succinate (TOPROL-XL) 50 MG 24 hr tablet Take 1 tablet (50 mg total) by mouth daily. Take with or immediately following a meal. 90 tablet 3  . nitroGLYCERIN (NITROSTAT) 0.4 MG SL tablet Place 0.4 mg under the tongue every 5 (five) minutes as needed for chest pain.     Marland Kitchen ocrelizumab 300 mg in sodium chloride 0.9 % 250 mL Inject 300 mg into the vein every 6 (six) months.    . pantoprazole (PROTONIX) 40 MG tablet Take 1 tablet (40 mg total) by mouth daily. 30 tablet 2  . rosuvastatin (CRESTOR) 20 MG tablet Take 1 tablet (20 mg total) by mouth daily. 30 tablet 11   No current facility-administered medications for this visit.       Physical Exam: There were no vitals taken for this visit.  {physical exam:21449}  Diagnostic Studies & Laboratory data:         Recent Radiology Findings: DG Chest 2 View  Result Date: 07/03/2020  CLINICAL DATA:  Incision site soreness following CABG EXAM: CHEST - 2 VIEW COMPARISON:  06/21/2020 FINDINGS: Post CABG changes including median sternotomy. Stable cardiomediastinal contours. Interval resolution of previously seen left basilar opacity. Possible trace residual left pleural effusion or pleural thickening, improved from prior. No focal airspace consolidation. No pneumothorax. IMPRESSION: Interval resolution of previously seen left basilar opacity. Electronically Signed   By: Duanne Guess D.O.   On: 07/03/2020 09:04   I have independently reviewed the above radiology studies  and reviewed the findings with the patient.   ECHO INTRAOPERATIVE TEE  Result Date: 06/18/2020  *INTRAOPERATIVE TRANSESOPHAGEAL REPORT *  Patient Name:   Gregory Gardner Date of Exam: 06/18/2020 Medical Rec #:   [id number]        Height:       71.0 in Accession #:    [id number]       Weight:       168.1 lb Date of Birth:  1961/05/13        BSA:          1.96 m Patient Age:    59 years         BP:           106/72 mmHg Patient Gender: M                HR:           59 bpm. Exam Location:  Anesthesiology Transesophogeal exam was perform intraoperatively during surgical procedure. Patient was closely monitored under general anesthesia during the entirety of examination. Indications:     CAD                   Re-do CABG Performing Phys: [id number] Ronniesha Seibold B Sims Laday Complications: No known complications during this procedure. PRE-OP FINDINGS  Left Ventricle: The left ventricle has normal systolic function, with an ejection fraction of 55-60%. The cavity size was normal. There is no increase in left ventricular wall thickness. Right Ventricle: The right ventricle has normal systolic function. The cavity was normal. There is no increase in right ventricular wall thickness. Left Atrium: Left atrial size was normal in size. The left atrial appendage is well visualized and there is no evidence of thrombus present. Right Atrium: Right atrial size was normal in size. Right atrial pressure is estimated at 10 mmHg. Interatrial Septum: No atrial level shunt detected by color flow Doppler. Pericardium: There is no evidence of pericardial effusion. Mitral Valve: The mitral valve is normal in structure. Mild thickening of the mitral valve leaflet. Mitral valve regurgitation is mild by color flow Doppler. The MR jet is centrally-directed. There is No evidence of mitral stenosis. Tricuspid Valve: The tricuspid valve was normal in structure. Tricuspid valve regurgitation is mild-moderate by color flow Doppler. The jet is directed centrally. Aortic Valve: The aortic valve is tricuspid There is mild thickening of the aortic valve Aortic valve regurgitation was not visualized by color flow Doppler. There is no evidence of aortic valve stenosis. Pulmonic  Valve: The pulmonic valve was normal in structure, with normal. Pulmonic valve regurgitation is trivial by color flow Doppler. Aorta: The aortic root and proximal ascending aorta were of normal size with a well defined sino-tubular junction without effacement. There was mild intimal thickening with no protruding atheromatous disease. The descending aorta was normal insize with grade 2 and 3 scattered atheromatous disease present.  Kipp Brood MD Electronically signed by Kipp Brood MD Signature Date/Time: 06/18/2020/9:23:33 PM    Final  VRecent Labs: Lab Results  Component Value Date   WBC 13.7 (H) 06/21/2020   HGB 10.9 (L) 06/21/2020   HCT 32.3 (L) 06/21/2020   PLT 112 (L) 06/21/2020   GLUCOSE 114 (H) 06/23/2020   CHOL 135 09/01/2020   TRIG 179 (H) 09/01/2020   HDL 48 09/01/2020   LDLCALC 57 09/01/2020   ALT 29 06/20/2020   AST 44 (H) 06/20/2020   NA 137 06/23/2020   K 4.0 06/23/2020   CL 103 06/23/2020   CREATININE 0.83 06/23/2020   BUN 16 06/23/2020   CO2 21 (L) 06/23/2020   INR 1.3 (H) 06/18/2020   HGBA1C 6.5 (H) 06/18/2020      Assessment / Plan:         Medication Changes: No orders of the defined types were placed in this encounter.    Delight Ovens 09/23/2020 7:41 AM

## 2020-10-03 NOTE — Progress Notes (Signed)
Cardiology Office Note:    Date:  10/07/2020   ID:  Gregory Gardner, DOB 1960/12/01, MRN [id number]  PCP:  Caffie Damme, MD  [facility] Cardiologist:  Charlton Haws, MD  [facility] Electrophysiologist:  None   Referring MD: Caffie Damme, MD   Chief Complaint:  No chief complaint on file.    Patient Profile:    Gregory Gardner is a 59 y.o. male with:   Coronary artery disease   S/p Inf STEMI >> CABG in 2003  S/p NSTEMI 8/21 >> s/p redo CABG  Ex-smoker  Multiple sclerosis  Hyperlipidemia   Intol of statins (?elevated LFTs)  Prior CV studies:  Echocardiogram 06/17/2020 EF 40-45, anterior/anterolateral HK, moderate asymmetric LVH, GR 1 DD, normal RVSF, RVSP 23.2  Carotid US 06/17/2020 Bilateral ICA 1-39  Cardiac catheterization 06/16/2020  Severe disease in the sequential saphenous vein graft to the diagonal and obtuse marginal. The continuation to the obtuse marginal is totally occluded. Proximal to mid vessel contains shaggy 99% regions of stenosis. The marginal fills by collaterals.  Severe disease in the sequential saphenous vein graft to the PDA and second left ventricular branch with occlusion of the distal anastomosis to the left ventricular branch. The ostial to proximal graft contains 75% stenosis. The insertion site into the PDA is 70% narrowed. The proximal and distal PDA at the insertion site contains high-grade stenosis. Second and third left ventricular branches fill by collaterals.  Patent LIMA to LAD.  Left main is patent  Proximal LAD is totally occluded  Circumflex is ostially occluded  Native RCA is distally occluded  Anterior wall moderate hypokinesis. EF 40 to 50%. LVEDP is normal.  Myoview 11/09/2018 EF 72, normal perfusion, low risk   History of Present Illness:    59 y.o. f/u CAD/CABG  He was admitted 8/16-8/24 with a non-STEMI.  Cardiac catheterization demonstrated severe disease in the vein graft to the diagonal and obtuse  marginal as well as severe disease in the vein graft to the PDA and second LV branch.  The LIMA-LAD was patent.  The LAD, LCx and RCA were all occluded.  EF was 40-45 by echocardiogram.  He was referred for redo bypass surgery.  This was performed by Dr. Tyrone Sage (SVG-DX/OM, RIMA-distal SVG-PDA).  His postoperative course was fairly uneventful.  He did have some drainage from his sternal wound which was managed with cephalexin.  He returns for follow-up.    He is here alone.  Since discharge from the hospital, he has been doing well.  His chest is still sore.  He is increasing his activity.  He has not had significant shortness of breath.  He has to clear his throat a lot and has been sitting up in a recliner at night due to this.  He has not had significant leg swelling.      His girlfriend past earlier this year with MI and sudden death at home. Still frustrated by lack of disability Resolution for his back Has not worked in 7 months   Past Medical History:  Diagnosis Date  . CAD (coronary artery disease)    s/p Inf STEMI >> S/p CABG in 2003 // Myoview 11/2018: low risk // s/p NSTEMI 8/21 >> s/p redo CABG  . Ischemic cardiomyopathy    Echo 8/21: EF 40-45, anterior/anterolateral HK, moderate asymmetric LVH, GR 1 DD, normal RVSF, RVSP 23.2  . Mixed hyperlipidemia    Intol of statins (?elevated LFTs)  . MRSA (methicillin resistant Staphylococcus aureus)   .  Multiple sclerosis (HCC)     Current Medications: Current Meds  Medication Sig  . acetaminophen (TYLENOL) 325 MG tablet Take 2 tablets (650 mg total) by mouth every 6 (six) hours as needed for mild pain.  . Albuterol Sulfate (VENTOLIN HFA IN) Inhale 1 puff into the lungs daily as needed (for wheezing).   Marland Kitchen alendronate (FOSAMAX) 70 MG tablet Take 70 mg by mouth once a week. Take with a full glass of water on an empty stomach.  Marland Kitchen aspirin EC 81 MG tablet Take 1 tablet (81 mg total) by mouth daily. Swallow whole.  . cholecalciferol (VITAMIN  D) 1000 UNITS tablet Take 1,000 Units by mouth daily.  . clopidogrel (PLAVIX) 75 MG tablet Take 1 tablet (75 mg total) by mouth daily.  Marland Kitchen ezetimibe (ZETIA) 10 MG tablet Take 10 mg by mouth daily.  Marland Kitchen guaiFENesin (MUCINEX) 600 MG 12 hr tablet Take 1 tablet (600 mg total) by mouth every 12 (twelve) hours as needed for cough.  . losartan (COZAAR) 25 MG tablet Take 1 tablet (25 mg total) by mouth daily.  . metoprolol succinate (TOPROL-XL) 50 MG 24 hr tablet Take 1 tablet (50 mg total) by mouth daily. Take with or immediately following a meal.  . nitroGLYCERIN (NITROSTAT) 0.4 MG SL tablet Place 0.4 mg under the tongue every 5 (five) minutes as needed for chest pain.   Marland Kitchen ocrelizumab 300 mg in sodium chloride 0.9 % 250 mL Inject 300 mg into the vein every 6 (six) months.  . pantoprazole (PROTONIX) 40 MG tablet Take 1 tablet (40 mg total) by mouth daily.     Allergies:   Lodine [etodolac]   Social History   Tobacco Use  . Smoking status: Never Smoker  . Smokeless tobacco: Never Used  Substance Use Topics  . Alcohol use: Yes  . Drug use: No     Family Hx: The patient's family history includes Heart Problems in his father.  ROS   EKGs/Labs/Other Test Reviewed:    EKG:  EKG is  ordered today.  The ekg ordered today demonstrates this normal sinus rhythm, heart rate 60, normal axis, T wave inversions 1, aVL, V3-V6, QTC 434  Recent Labs: 06/19/2020: Magnesium 2.3 06/20/2020: ALT 29 06/21/2020: Hemoglobin 10.9; Platelets 112 06/23/2020: BUN 16; Creatinine, Ser 0.83; Potassium 4.0; Sodium 137   Recent Lipid Panel Lab Results  Component Value Date/Time   CHOL 135 09/01/2020 09:20 AM   TRIG 179 (H) 09/01/2020 09:20 AM   HDL 48 09/01/2020 09:20 AM   CHOLHDL 2.8 09/01/2020 09:20 AM   CHOLHDL 4.4 06/17/2020 04:21 AM   LDLCALC 57 09/01/2020 09:20 AM    Physical Exam:    VS:  BP 110/72   Pulse 67   Ht 5\' 11"  (1.803 m)   Wt 76.2 kg   SpO2 99%   BMI 23.43 kg/m     Wt Readings from Last 3  Encounters:  10/07/20 76.2 kg  08/07/20 73 kg  07/08/20 72.8 kg     Affect appropriate Healthy:  appears stated age HEENT: normal Neck supple with no adenopathy JVP normal no bruits no thyromegaly Lungs clear with no wheezing and good diaphragmatic motion Heart:  S1/S2 no murmur, no rub, gallop or click PMI normal post sternotomy x 2 Abdomen: benighn, BS positve, no tenderness, no AAA no bruit.  No HSM or HJR Distal pulses intact with no bruits No edema Neuro non-focal Skin warm and dry No muscular weakness    ASSESSMENT & PLAN:  1. CAD:  CABG 2003 with IMI  Redo bypass 06/18/20 SVG sequential to Diagonal/OM and RIMA to old distal right vein graft Continue medical Rx   2. Ischemic cardiomyopathy EF 40-45.  NYHA II.  euvolemic f/u echo in a year post revascularization   3. Mixed hyperlipidemia Continue ezetimibe seems to be tolerating low dose crestor LDL 57 09/01/20 needs LFTls  4.  GERD As he is being placed on Clopidogrel, I will change his omeprazole to pantoprazole.  He can get future refills with primary care.    Dispo:  No follow-ups on file.   Medication Adjustments/Labs and Tests Ordered: Current medicines are reviewed at length with the patient today.  Concerns regarding medicines are outlined above.  Tests Ordered:  None    Medication Changes: No orders of the defined types were placed in this encounter.  F/U in a year   Signed, Charlton Haws, MD  10/07/2020 8:16 AM    [facility] [address] [place], [place], [place]  [address] Phone: [phone]; Fax: [phone]

## 2020-10-07 ENCOUNTER — Encounter: Payer: Self-pay | Admitting: Cardiovascular Disease

## 2020-10-07 ENCOUNTER — Ambulatory Visit (INDEPENDENT_AMBULATORY_CARE_PROVIDER_SITE_OTHER): Payer: Managed Care, Other (non HMO) | Admitting: Cardiovascular Disease

## 2020-10-07 ENCOUNTER — Other Ambulatory Visit: Payer: Self-pay

## 2020-10-07 VITALS — BP 110/72 | HR 67 | Ht 71.0 in | Wt 168.0 lb

## 2020-10-07 DIAGNOSIS — Z951 Presence of aortocoronary bypass graft: Secondary | ICD-10-CM

## 2020-10-07 NOTE — Patient Instructions (Signed)

## 2020-10-09 ENCOUNTER — Encounter: Payer: Self-pay | Admitting: Cardiothoracic Surgery

## 2020-10-09 ENCOUNTER — Ambulatory Visit (INDEPENDENT_AMBULATORY_CARE_PROVIDER_SITE_OTHER): Payer: Managed Care, Other (non HMO) | Admitting: Cardiothoracic Surgery

## 2020-10-09 ENCOUNTER — Other Ambulatory Visit: Payer: Self-pay

## 2020-10-09 VITALS — BP 147/83 | HR 69 | Resp 18 | Ht 71.0 in | Wt 169.0 lb

## 2020-10-09 DIAGNOSIS — Z951 Presence of aortocoronary bypass graft: Secondary | ICD-10-CM | POA: Diagnosis not present

## 2020-10-09 NOTE — Progress Notes (Signed)
[address]       [place] [address]             [phone]                  [name] [facility] Medical Record #[id number] Date of Birth: [birth year]  Referring OM:VEHMCN, [name], MD Primary Cardiology: Primary Care:[name], [name], MD  Chief Complaint:  Follow Up Visit OPERATIVE REPORT DATE OF PROCEDURE:  06/18/2020 PREOPERATIVE DIAGNOSIS:  Recurrent coronary artery disease. POSTOPERATIVE DIAGNOSIS:  Recurrent coronary artery disease. SURGICAL PROCEDURE:  Redo coronary artery bypass grafting x3 with the right internal mammary to the distal right coronary artery graft, sequential reverse saphenous vein graft to the diagonal, first obtuse marginal with left leg endovein harvesting.  History of Present Illness:     Patient returns to the office after recent coronary bypass grafting.  Patient continues to make good progress, from a cardiac standpoint.  He has had no recurrent angina  He notes he continues to have significant back pain with numbness and usually his left leg but occasionally in his right leg currently being evaluated by Workmen's Comp. because of chronic back pain  The patient had previous history of "fatty liver" with statins-he is to be referred to the lipid clinic currently he is agreeable to taking low-dose statin   Social History   Tobacco Use  Smoking Status Never Smoker  Smokeless Tobacco Never Used       Allergies  Allergen Reactions  . Lodine [Etodolac] Nausea Only    Current Outpatient Medications  Medication Sig Dispense Refill  . acetaminophen (TYLENOL) 325 MG tablet Take 2 tablets (650 mg total) by mouth every 6 (six) hours as needed for mild pain.    . Albuterol Sulfate (VENTOLIN HFA IN) Inhale 1 puff into the lungs daily as needed (for wheezing).     Marland Kitchen alendronate (FOSAMAX) 70 MG tablet Take 70 mg by mouth once a week. Take with a full glass of water on an empty stomach.    Marland Kitchen aspirin EC 81 MG tablet Take 1  tablet (81 mg total) by mouth daily. Swallow whole. 30 tablet 11  . cholecalciferol (VITAMIN D) 1000 UNITS tablet Take 1,000 Units by mouth daily.    . clopidogrel (PLAVIX) 75 MG tablet Take 1 tablet (75 mg total) by mouth daily. 90 tablet 3  . ezetimibe (ZETIA) 10 MG tablet Take 10 mg by mouth daily.    Marland Kitchen guaiFENesin (MUCINEX) 600 MG 12 hr tablet Take 1 tablet (600 mg total) by mouth every 12 (twelve) hours as needed for cough.    . losartan (COZAAR) 25 MG tablet Take 1 tablet (25 mg total) by mouth daily. 30 tablet 3  . metoprolol succinate (TOPROL-XL) 50 MG 24 hr tablet Take 1 tablet (50 mg total) by mouth daily. Take with or immediately following a meal. 90 tablet 3  . nitroGLYCERIN (NITROSTAT) 0.4 MG SL tablet Place 0.4 mg under the tongue every 5 (five) minutes as needed for chest pain.     Marland Kitchen ocrelizumab 300 mg in sodium chloride 0.9 % 250 mL Inject 300 mg into the vein every 6 (six) months.    . pantoprazole (PROTONIX) 40 MG tablet Take 1 tablet (40 mg total) by mouth daily. 30 tablet 2  . DULoxetine (CYMBALTA) 30 MG capsule Take 1 capsule (30 mg total) by mouth daily. 30 capsule 2  . rosuvastatin (CRESTOR) 20 MG tablet Take 1 tablet (20  mg total) by mouth daily. 30 tablet 11   No current facility-administered medications for this visit.       Physical Exam: BP (!) 147/83 (BP Location: Left Arm, Patient Position: Sitting)   Pulse 69   Resp 18   Ht 5\' 11"  (1.803 m)   Wt 169 lb (76.7 kg)   SpO2 98% Comment: RA with mask on  BMI 23.57 kg/m   General appearance: alert, cooperative and no distress Head: Normocephalic, without obvious abnormality, atraumatic Lymph nodes: Cervical, supraclavicular, and axillary nodes normal. Resp: clear to auscultation bilaterally Cardio: regular rate and rhythm, S1, S2 normal, no murmur, click, rub or gallop Extremities: extremities normal, atraumatic, no cyanosis or edema Neurologic: Grossly normal Sternum is stable well-healed  Diagnostic  Studies & Laboratory data:         Recent Radiology Findings: DG Chest 2 View  Result Date: 07/03/2020 CLINICAL DATA:  Incision site soreness following CABG EXAM: CHEST - 2 VIEW COMPARISON:  06/21/2020 FINDINGS: Post CABG changes including median sternotomy. Stable cardiomediastinal contours. Interval resolution of previously seen left basilar opacity. Possible trace residual left pleural effusion or pleural thickening, improved from prior. No focal airspace consolidation. No pneumothorax. IMPRESSION: Interval resolution of previously seen left basilar opacity. Electronically Signed   By: 06/23/2020 D.O.   On: 07/03/2020 09:04   I have independently reviewed the above radiology studies  and reviewed the findings with the patient.   ECHO INTRAOPERATIVE TEE  Result Date: 06/18/2020  *INTRAOPERATIVE TRANSESOPHAGEAL REPORT *  Patient Name:   Gregory Gardner Date of Exam: 06/18/2020 Medical Rec #:  06/20/2020        Height:       71.0 in Accession #:    [id number]       Weight:       168.1 lb Date of Birth:  Sep 22, 1961        BSA:          1.96 m Patient Age:    59 years         BP:           106/72 mmHg Patient Gender: M                HR:           59 bpm. Exam Location:  Anesthesiology Transesophogeal exam was perform intraoperatively during surgical procedure. Patient was closely monitored under general anesthesia during the entirety of examination. Indications:     CAD                   Re-do CABG Performing Phys: [id number] Chelle Cayton B Kelso Bibby Complications: No known complications during this procedure. PRE-OP FINDINGS  Left Ventricle: The left ventricle has normal systolic function, with an ejection fraction of 55-60%. The cavity size was normal. There is no increase in left ventricular wall thickness. Right Ventricle: The right ventricle has normal systolic function. The cavity was normal. There is no increase in right ventricular wall thickness. Left Atrium: Left atrial size was normal in size. The left  atrial appendage is well visualized and there is no evidence of thrombus present. Right Atrium: Right atrial size was normal in size. Right atrial pressure is estimated at 10 mmHg. Interatrial Septum: No atrial level shunt detected by color flow Doppler. Pericardium: There is no evidence of pericardial effusion. Mitral Valve: The mitral valve is normal in structure. Mild thickening of the mitral valve leaflet. Mitral valve regurgitation is mild by color flow Doppler.  The MR jet is centrally-directed. There is No evidence of mitral stenosis. Tricuspid Valve: The tricuspid valve was normal in structure. Tricuspid valve regurgitation is mild-moderate by color flow Doppler. The jet is directed centrally. Aortic Valve: The aortic valve is tricuspid There is mild thickening of the aortic valve Aortic valve regurgitation was not visualized by color flow Doppler. There is no evidence of aortic valve stenosis. Pulmonic Valve: The pulmonic valve was normal in structure, with normal. Pulmonic valve regurgitation is trivial by color flow Doppler. Aorta: The aortic root and proximal ascending aorta were of normal size with a well defined sino-tubular junction without effacement. There was mild intimal thickening with no protruding atheromatous disease. The descending aorta was normal insize with grade 2 and 3 scattered atheromatous disease present.  Kipp Brood MD Electronically signed by Kipp Brood MD Signature Date/Time: 06/18/2020/9:23:33 PM    Final    VRecent Labs: Lab Results  Component Value Date   WBC 13.7 (H) 06/21/2020   HGB 10.9 (L) 06/21/2020   HCT 32.3 (L) 06/21/2020   PLT 112 (L) 06/21/2020   GLUCOSE 114 (H) 06/23/2020   CHOL 135 09/01/2020   TRIG 179 (H) 09/01/2020   HDL 48 09/01/2020   LDLCALC 57 09/01/2020   ALT 29 06/20/2020   AST 44 (H) 06/20/2020   NA 137 06/23/2020   K 4.0 06/23/2020   CL 103 06/23/2020   CREATININE 0.83 06/23/2020   BUN 16 06/23/2020   CO2 21 (L) 06/23/2020   INR 1.3  (H) 06/18/2020   HGBA1C 6.5 (H) 06/18/2020      Assessment / Plan:   #1 status post acute myocardial infarction and redo coronary artery bypass grafting August 2021 patient doing well without any recurrent symptoms of angina or congestive heart failure #2 history of multiple sclerosis #3 chronic back pain currently being evaluated by Workmen's Comp.  From a cardiac surgery standpoint the patient is made good progress following his redo coronary artery bypass grafting-he will continue to be followed by cardiology we have not made him a return appointment to the surgical office but would be glad to see him as needed  Medication Changes: No orders of the defined types were placed in this encounter.    Delight Ovens 10/09/2020 4:05 PM

## 2020-10-17 ENCOUNTER — Telehealth: Payer: Self-pay

## 2020-10-17 NOTE — Telephone Encounter (Signed)
Informed patient's nurse manager that surgeon would need to fax our office a clearance form to be cleared with the type of procedure and how long plavix would need to be held. Informed him that most of the time patient has to be on Plavix for a year after a CABG before they can hold it for surgery.

## 2020-10-17 NOTE — Telephone Encounter (Signed)
Melinda Crutch, patient's nurse manager is calling in regards to holding Plavix pre back surgery.  Please call Melinda Crutch [phone] for further information.  Patient of Dr Eden Emms.

## 2020-10-21 ENCOUNTER — Other Ambulatory Visit: Payer: Self-pay | Admitting: Physician Assistant

## 2020-10-21 NOTE — Telephone Encounter (Signed)
Dr Eden Emms has never filled this for the patient. See rx, it was previously filled by PA, Erin Barrett. Okay to refill? Please advise. Thanks, MI

## 2020-12-17 ENCOUNTER — Other Ambulatory Visit: Payer: Self-pay | Admitting: Physician Assistant

## 2021-05-19 ENCOUNTER — Telehealth: Payer: Self-pay

## 2021-05-19 NOTE — Telephone Encounter (Signed)
   [facility] Pre-operative Risk Assessment    Patient Name: Gregory Gardner  DOB: 10-23-1961 MRN: [id number]  HEARTCARE STAFF:  - IMPORTANT!!!!!! Under Visit Info/Reason for Call, type in Other and utilize the format Clearance MM/DD/YY or Clearance TBD. Do not use dashes or single digits. - Please review there is not already an duplicate clearance open for this procedure. - If request is for dental extraction, please clarify the # of teeth to be extracted. - If the patient is currently at the dentist's office, call Pre-Op Callback Staff (MA/nurse) to input urgent request.  - If the patient is not currently in the dentist office, please route to the Pre-Op pool.  Request for surgical clearance:  What type of surgery is being performed? Colonoscopy  When is this surgery scheduled? 06/17/2021  What type of clearance is required (medical clearance vs. Pharmacy clearance to hold med vs. Both)? Both  Are there any medications that need to be held prior to surgery and how long? Plavix and Aspirin  Practice name and name of physician performing surgery? Dr Chryl Heck at [facility]  What is the office phone number? [phone]   7.   What is the office fax number? [phone]  8.   Anesthesia type (None, local, MAC, general) ? MAC   Ulice Brilliant T 05/19/2021, 4:47 PM  _________________________________________________________________   (provider comments below)

## 2021-05-19 NOTE — Telephone Encounter (Signed)
Received clearance from [facility] with no information about what they were seeking. I contacted their office and requested more information. After speaking with two people I was able to speak with the team Lead and she states she would get more information and refax the clearance to our office.   Awaiting clearance.

## 2021-05-21 NOTE — Telephone Encounter (Signed)
      Patient Name: Gregory Gardner  DOB: 15-Mar-1961 MRN: [id number]   Primary Cardiologist: Charlton Haws, MD   Chart reviewed as part of pre-operative protocol coverage for colonoscopy. PMH of CAD s/p inferior STEMI and CABG 2003, s/p non-STEMI 8/21 with redo CABG, as well as prior tobacco use, multiple sclerosis, hyperlipidemia with intolerance to statins/?  Elevated LFTs. Previous 06/2020 echo with EF 40 to 45%, anterior/anterior lateral hypokinesis, moderate asymmetric LVH, G1 DD, RV SF normal, RVSP 23.2 mmHg.  Previous 2021 carotid ultrasound with bilateral ICA 1 to 39%.     He was last seen in office 10/07/2020 by his primary cardiologist, Dr. Eden Emms.  He was euvolemic with NYHA class II symptoms.  No medication changes. Follow-up echo recommended 1 year post revascularization (estimated at 06/16/2021).  LFTs recommended. Per Dr. Eden Emms, patient is acceptable to proceed to colonoscopy before TTE done. Pt is determined to be acceptable risk for the planned procedure and may proceed with colonoscopy without further cardiovascular testing.    Per Dr. Eden Emms, OK to hold Plavix and proceed with colonoscopy before TTE done. Recommend discontinuing clopidogrel 7 days prior to surgery. Regarding ASA therapy, we recommend continuation of ASA throughout the perioperative period.  However, if the surgeon feels that cessation of ASA is required in the perioperative period, it may be stopped 5-7 days prior to surgery with a plan to resume it as soon as felt to be feasible from a surgical standpoint in the post-operative period. Reinitiation of clopidogrel and ASA should be done as soon as it is felt safe to do so after surgery.   I will route this recommendation to the requesting party via Epic fax function and remove from pre-op pool. Please call with questions.  Signed, Lennon Alstrom, PA-C 05/21/2021, 1:11 PM

## 2021-05-21 NOTE — Telephone Encounter (Signed)
  Dr. Eden Emms, please see below and advise regarding: 1) OK to hold ASA and Plavix?   [Plavix started 07/08/2020 s/p redo CABG 06/16/2020] 2) Do you want the repeat echo before his colonoscopy (recommended at last OV)?  --------------------------------------     Patient Name: Gregory Gardner  DOB: 01/03/1961 MRN: [id number]  Primary Cardiologist: Charlton Haws, MD  Chart reviewed as part of pre-operative protocol coverage for colonoscopy. PMH of CAD s/p inferior STEMI and CABG 2003, s/p non-STEMI 8/21 with redo CABG, as well as prior tobacco use, multiple sclerosis, hyperlipidemia with intolerance to statins/?  Elevated LFTs. Previous 06/2020 echo with EF 40 to 45%, anterior/anterior lateral hypokinesis, moderate asymmetric LVH, G1 DD, RV SF normal, RVSP 23.2 mmHg.  Previous 2021 carotid ultrasound with bilateral ICA 1 to 39%.    He was last seen in office 10/07/2020 by his primary cardiologist, Dr. Eden Emms.  He was euvolemic with NYHA class II symptoms.  No medication changes. Follow-up echo recommended 1 year post revascularization (estimated at 06/16/2021).  LFTs recommended. Patient has yet to have his follow-up echo.  Routing to primary cardiologist for recommendations regarding echo and antiplatelet therapy as outlined above.  Dr. Eden Emms to advise: 1) OK to hold ASA and Plavix?   [Plavix started 07/08/2020 s/p redo CABG 06/16/2020] 2) Do you want the repeat echo (s/p redo CABG) before his colonoscopy?   Lennon Alstrom, PA-C 05/21/2021, 11:49 AM

## 2021-07-01 ENCOUNTER — Other Ambulatory Visit: Payer: Self-pay | Admitting: Cardiovascular Disease

## 2021-07-01 DIAGNOSIS — I10 Essential (primary) hypertension: Secondary | ICD-10-CM

## 2021-07-01 DIAGNOSIS — E782 Mixed hyperlipidemia: Secondary | ICD-10-CM

## 2021-10-23 ENCOUNTER — Other Ambulatory Visit: Payer: Self-pay | Admitting: Cardiovascular Disease

## 2021-10-23 DIAGNOSIS — I10 Essential (primary) hypertension: Secondary | ICD-10-CM

## 2021-10-23 DIAGNOSIS — E782 Mixed hyperlipidemia: Secondary | ICD-10-CM

## 2021-11-12 ENCOUNTER — Other Ambulatory Visit: Payer: Self-pay | Admitting: Cardiovascular Disease

## 2021-11-12 DIAGNOSIS — E782 Mixed hyperlipidemia: Secondary | ICD-10-CM

## 2021-11-12 DIAGNOSIS — I10 Essential (primary) hypertension: Secondary | ICD-10-CM

## 2021-11-21 IMAGING — CR DG CHEST 2V
2 series · 2 of 2 positions shown · non-contrast
Comparison: 06/21/2020

CLINICAL DATA: Incision site soreness following CABG

EXAM:
CHEST - 2 VIEW

[w chest pa]
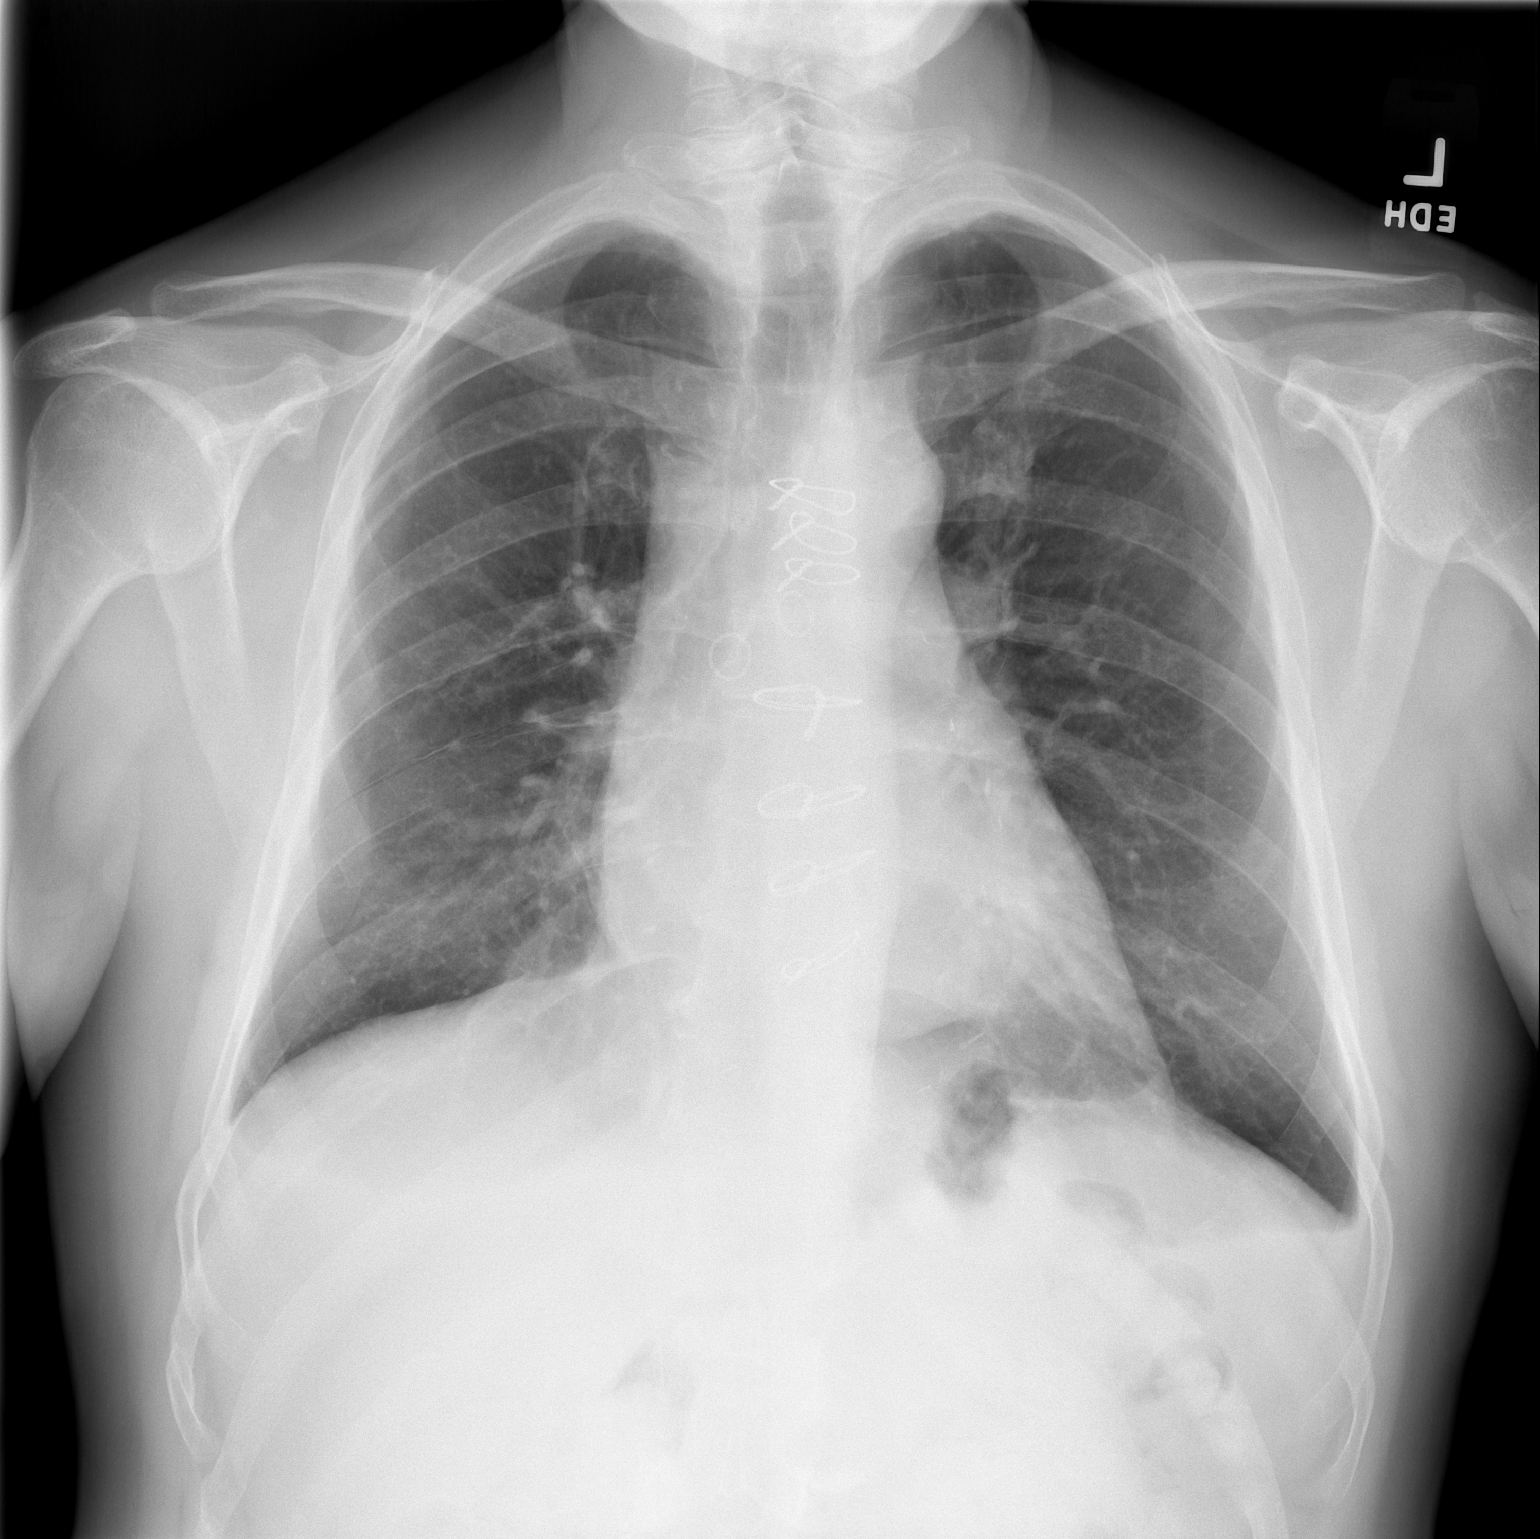

[w chest lat]
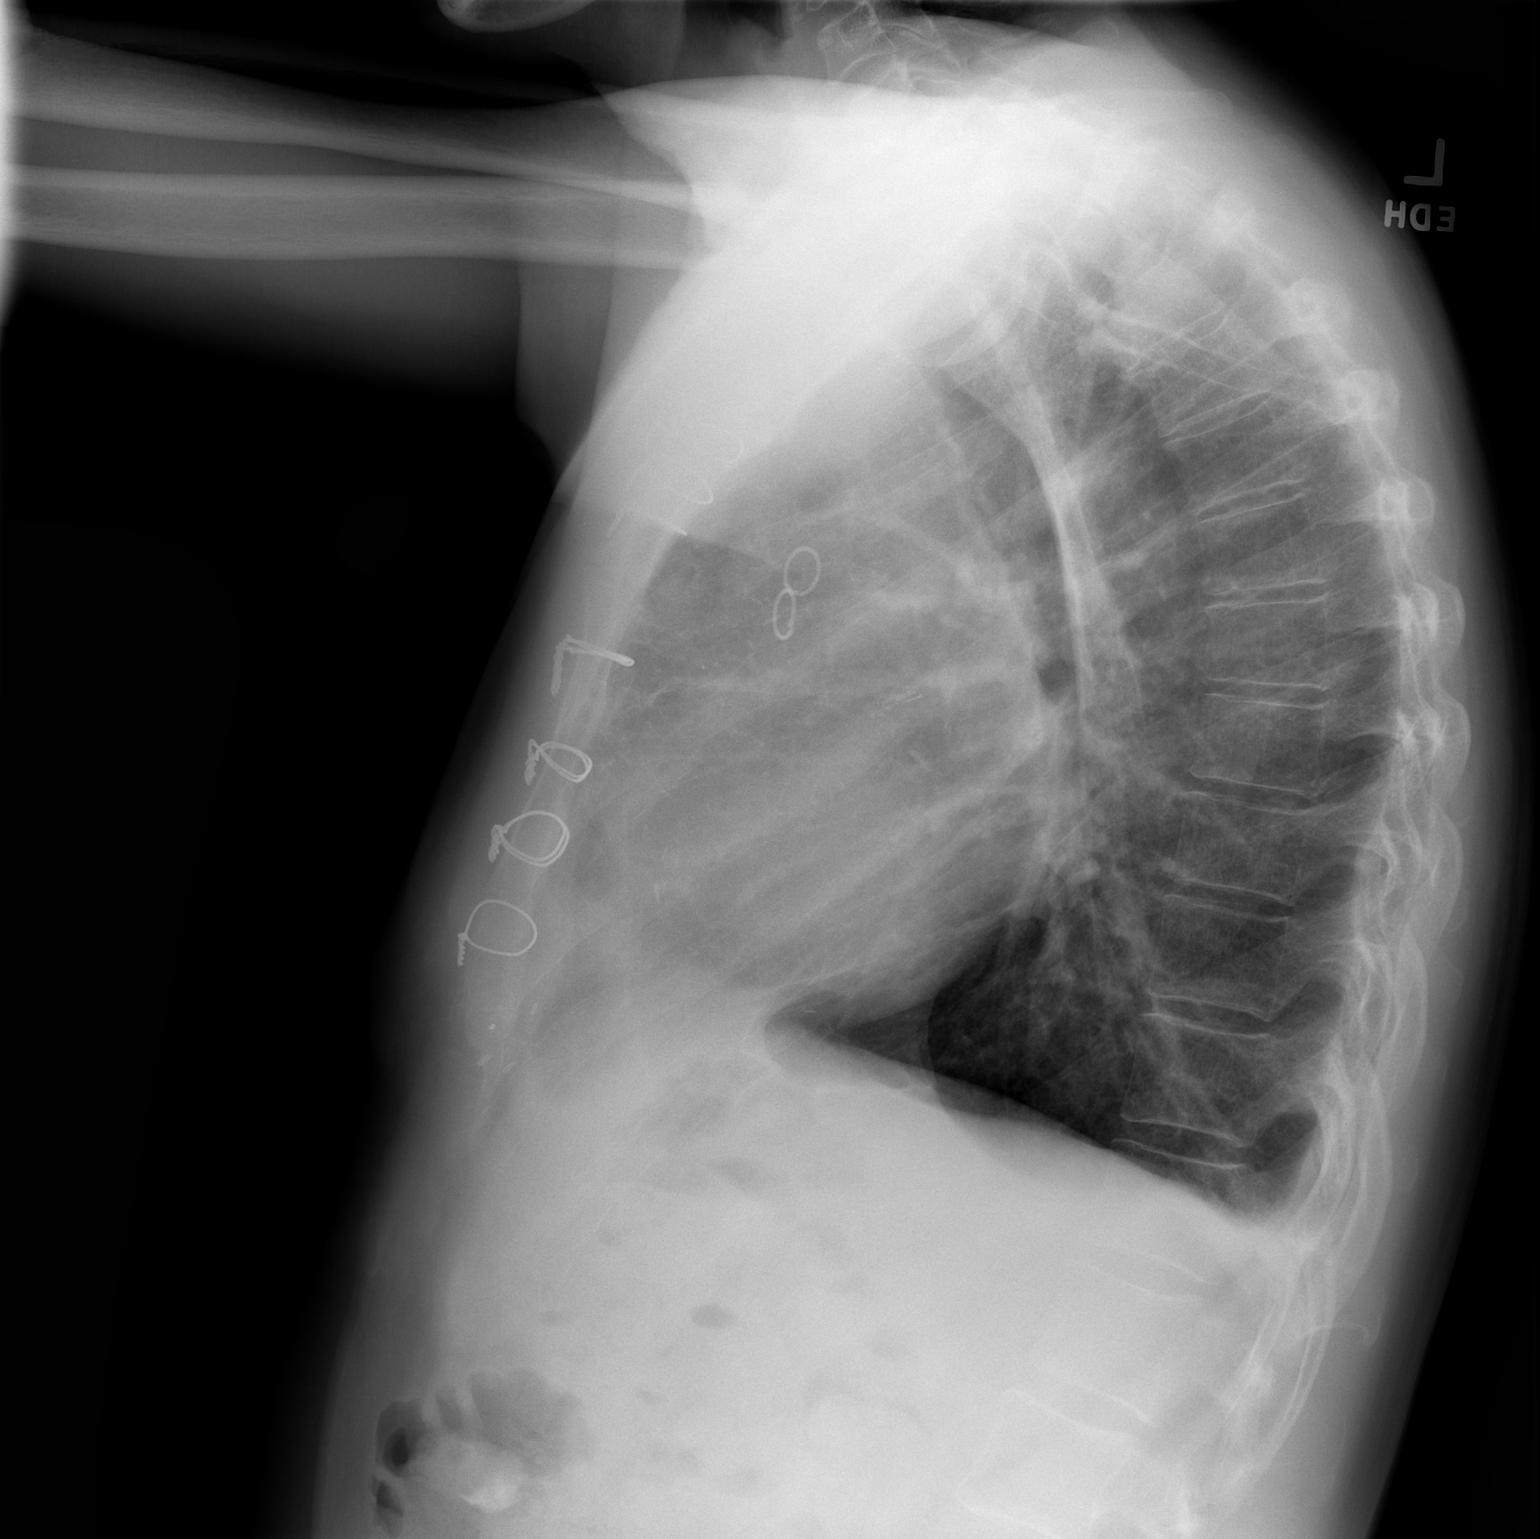

[2 of 2 positions shown; findings below may reference images not displayed]

FINDINGS: Post CABG changes including median sternotomy. Stable
cardiomediastinal contours. Interval resolution of previously seen
left basilar opacity. Possible trace residual left pleural effusion
or pleural thickening, improved from prior. No focal airspace
consolidation. No pneumothorax.
IMPRESSION: Interval resolution of previously seen left basilar opacity.

## 2022-01-19 ENCOUNTER — Telehealth: Payer: Self-pay

## 2022-01-19 NOTE — Telephone Encounter (Signed)
NOTES SCANNED TO REFERRAL 

## 2022-01-27 NOTE — Progress Notes (Signed)
? ?Cardiology Office Note:   ? ?Date:  01/28/2022  ? ?ID:  Gregory Gardner, DOB 1961/02/01, MRN [id number] ? ?PCP:  Glendon Axe, MD ?  ?[facility] Providers ?Cardiologist:  Jenkins Rouge, MD    ? ?Referring MD: Glendon Axe, MD  ? ?Chief Complaint: annual follow-up CAD ? ?History of Present Illness:   ? ?Gregory Gardner is a 61 y.o. male with a hx of CAD s/p inferior STEMI and CABG 2003, s/p NSTEMI with redo CABG 2021,  s/p NSTEMI, ischemic cardiomyopathy, hyperlipidemia, claudication, former tobacco abuse, and elevated liver enzymes.  ? ?He had normal ABIs with no evidence of circulation issues in 2014.  ? ?Admission 8/16-8/24/21 with NSTEMI.  Cardiac catheterization demonstrated severe disease in the vein graft to the diagonal and obtuse marginal as well as severe disease in the vein graft to the PDA and second LV branch.  The LIMA to LAD was patent.  The LAD LCx and RCA were all occluded.  EF was 40 to 45% by echo.  He was referred for redo bypass surgery which was performed by Dr. Servando Snare on 06/18/2020 (SVG-DX/OM, RIMA-distal SVG-PDA).Marland Kitchen  Course was uneventful.  He was frustrated by lack of disability and had not worked for 7 months at the time of his follow-up 10/2020. ? ?He was last seen in our office on 10/07/20 by Dr. Johnsie Cancel at which time recommendation for repeat echo 1 year post revascularization and one year follow-up were made. There is no record of this.  He was cleared for colonoscopy August 2022.  ? ?On 01/19/22 our office received a referral from his primary care provider for an appointment due to symptoms of angina. ? ?Today, he is here alone for follow-up. States he has had problems with getting insurance coverage to come to appointments. Overall feeling well. Has recently noticed increased bilateral leg swelling. He denies shortness of breath, orthopnea, or PND. He denies chest pain. He denies fatigue, palpitations, diaphoresis, weakness, presyncope, and syncope. Bleeds easily due to frequently  being outside and working on machinery, no significant bleeding concerns. Has not worked outside of his home for many years due to constant back pain and left leg numbness. Mostly cooks at home, occasionally eats out. Does not add salt to food but otherwise eats a pretty unrestricted diet. We discussed healthy options for cooking oils and snacks.  ? ?Past Medical History:  ?Diagnosis Date  ? CAD (coronary artery disease)   ? s/p Inf STEMI >> S/p CABG in 2003 // Myoview 11/2018: low risk // s/p NSTEMI 8/21 >> s/p redo CABG  ? Ischemic cardiomyopathy   ? Echo 8/21: EF 40-45, anterior/anterolateral HK, moderate asymmetric LVH, GR 1 DD, normal RVSF, RVSP 23.2  ? Mixed hyperlipidemia   ? Intol of statins (?elevated LFTs)  ? MRSA (methicillin resistant Staphylococcus aureus)   ? Multiple sclerosis ([place])   ? ? ?Past Surgical History:  ?Procedure Laterality Date  ? CORONARY ARTERY BYPASS GRAFT N/A 06/18/2020  ? Procedure: REDO CORONARY ARTERY BYPASS GRAFTING (CABG), ON PUMP, TIMES THREE, USING RIGHT INTERNAL MAMMARY ARTERY AND ENDOSCOPICALLY HARVESTED LEFT GREATER SAPHENOUS VEIN;  Surgeon: Grace Isaac, MD;  Location: [facility];  Service: Open Heart Surgery;  Laterality: N/A;  SEQENTIAL DIAG TO OM ?RIMA TO PREVIOUS DISTAL RIGHT VEIN GRAFT  ? ENDOVEIN HARVEST OF GREATER SAPHENOUS VEIN Left 06/18/2020  ? Procedure: ENDOVEIN HARVEST OF GREATER SAPHENOUS VEIN;  Surgeon: Grace Isaac, MD;  Location: [place];  Service: Open Heart Surgery;  Laterality: Left;  ?  LEFT HEART CATH AND CORS/GRAFTS ANGIOGRAPHY N/A 06/16/2020  ? Procedure: LEFT HEART CATH AND CORS/GRAFTS ANGIOGRAPHY;  Surgeon: Belva Crome, MD;  Location: [facility];  Service: Cardiovascular;  Laterality: N/A;  ? MANDIBLE SURGERY    ? TEE WITHOUT CARDIOVERSION N/A 06/18/2020  ? Procedure: TRANSESOPHAGEAL ECHOCARDIOGRAM (TEE);  Surgeon: Grace Isaac, MD;  Location: [facility];  Service: Open Heart Surgery;  Laterality: N/A;  ? ? ?Current Medications: ?Current  Meds  ?Medication Sig  ? acetaminophen (TYLENOL) 325 MG tablet Take 2 tablets (650 mg total) by mouth every 6 (six) hours as needed for mild pain.  ? Albuterol Sulfate (VENTOLIN HFA IN) Inhale 1 puff into the lungs daily as needed (for wheezing).   ? alendronate (FOSAMAX) 70 MG tablet Take 70 mg by mouth once a week. Take with a full glass of water on an empty stomach.  ? aspirin EC 81 MG tablet Take 1 tablet (81 mg total) by mouth daily. Swallow whole.  ? clopidogrel (PLAVIX) 75 MG tablet Take 1 tablet (75 mg total) by mouth daily.  ? DULoxetine (CYMBALTA) 30 MG capsule Take 1 capsule (30 mg total) by mouth daily.  ? ezetimibe (ZETIA) 10 MG tablet Take 10 mg by mouth daily.  ? fenofibrate micronized (LOFIBRA) 134 MG capsule Take 134 mg by mouth daily.  ? guaiFENesin (MUCINEX) 600 MG 12 hr tablet Take 1 tablet (600 mg total) by mouth every 12 (twelve) hours as needed for cough.  ? losartan (COZAAR) 25 MG tablet TAKE 1 TABLET BY MOUTH DAILY  ? metoprolol succinate (TOPROL-XL) 50 MG 24 hr tablet TAKE 1 TABLET BY MOUTH DAILY.  ? nitroGLYCERIN (NITROSTAT) 0.4 MG SL tablet Place 0.4 mg under the tongue every 5 (five) minutes as needed for chest pain.   ? ocrelizumab 300 mg in sodium chloride 0.9 % 250 mL Inject 300 mg into the vein every 6 (six) months.  ? pantoprazole (PROTONIX) 40 MG tablet Take 1 tablet (40 mg total) by mouth daily.  ? rosuvastatin (CRESTOR) 20 MG tablet TAKE 1 TABLET BY MOUTH DAILY.  ?  ? ?Allergies:   Lodine [etodolac]  ? ?Social History  ? ?Socioeconomic History  ? Marital status: Married  ?  Spouse name: Not on file  ? Number of children: Not on file  ? Years of education: Not on file  ? Highest education level: Not on file  ?Occupational History  ? Not on file  ?Tobacco Use  ? Smoking status: Never  ? Smokeless tobacco: Never  ?Substance and Sexual Activity  ? Alcohol use: Yes  ? Drug use: No  ? Sexual activity: Not on file  ?Other Topics Concern  ? Not on file  ?Social History Narrative  ? Not  on file  ? ?Social Determinants of Health  ? ?Financial Resource Strain: Not on file  ?Food Insecurity: Not on file  ?Transportation Needs: Not on file  ?Physical Activity: Not on file  ?Stress: Not on file  ?Social Connections: Not on file  ?  ? ?Family History: ?The patient's family history includes Heart Problems in his father. ? ?ROS:   ?Please see the history of present illness.    ?+ bilateral leg swelling ?All other systems reviewed and are negative. ? ?Labs/Other Studies Reviewed:   ? ?The following studies were reviewed today: ? ?Echocardiogram 06/17/2020 ?EF 40-45, anterior/anterolateral HK, moderate asymmetric LVH, GR 1 DD, normal RVSF, RVSP 23.2 ?  ?Carotid US 06/17/2020 ?Bilateral ICA 1-39 ?  ?Cardiac catheterization  06/16/2020 ?Severe disease in the sequential saphenous vein graft to the diagonal and obtuse marginal. The continuation to the obtuse marginal is totally occluded. Proximal to mid vessel contains shaggy 99% regions of stenosis. The marginal fills by collaterals. ?Severe disease in the sequential saphenous vein graft to the PDA and second left ventricular branch with occlusion of the distal anastomosis to the left ventricular branch. The ostial to proximal graft contains 75% stenosis. The insertion site into the PDA is 70% narrowed. The proximal and distal PDA at the insertion site contains high-grade stenosis. Second and third left ventricular branches fill by collaterals. ?Patent LIMA to LAD. ?Left main is patent ?Proximal LAD is totally occluded ?Circumflex is ostially occluded ?Native RCA is distally occluded ?Anterior wall moderate hypokinesis. EF 40 to 50%. LVEDP is normal. ? ?CABG x 3: He was taken to the operating room on 06/18/2020. He underwent Redo Sternotomy, CABG x 3 utilizing LIMA, Sequential SVG to Diagonal and OM, and RIMA to distal Right Vein Graft. He also underwent endoscopic harvest of greater saphenous vein harvest from his left leg.  ? ? ?Myoview 11/09/2018 ?EF 72, normal  perfusion, low risk ? ?Faxed from PCP ?Collected 01/15/22 ?Recent Labs: ?K+ 4.1, Na 144 ?Creatinine 0.9, BUN 13 ?GFR 87 ?Glucose 61 ?AST 47, ALT 49 ?Hgb 14.4, Plt 175 ? ?Recent Lipid Panel ? ?LDL 125, HDL 5

## 2022-01-28 ENCOUNTER — Encounter: Payer: Self-pay | Admitting: Nurse Practitioner

## 2022-01-28 ENCOUNTER — Ambulatory Visit: Payer: Managed Care, Other (non HMO) | Admitting: Nurse Practitioner

## 2022-01-28 ENCOUNTER — Telehealth: Payer: Self-pay | Admitting: Nurse Practitioner

## 2022-01-28 ENCOUNTER — Other Ambulatory Visit: Payer: Self-pay | Admitting: *Deleted

## 2022-01-28 VITALS — BP 122/68 | HR 54 | Ht 70.5 in | Wt 169.0 lb

## 2022-01-28 DIAGNOSIS — I10 Essential (primary) hypertension: Secondary | ICD-10-CM | POA: Diagnosis not present

## 2022-01-28 DIAGNOSIS — I255 Ischemic cardiomyopathy: Secondary | ICD-10-CM

## 2022-01-28 DIAGNOSIS — E782 Mixed hyperlipidemia: Secondary | ICD-10-CM

## 2022-01-28 DIAGNOSIS — I2510 Atherosclerotic heart disease of native coronary artery without angina pectoris: Secondary | ICD-10-CM

## 2022-01-28 DIAGNOSIS — I252 Old myocardial infarction: Secondary | ICD-10-CM

## 2022-01-28 DIAGNOSIS — I739 Peripheral vascular disease, unspecified: Secondary | ICD-10-CM

## 2022-01-28 DIAGNOSIS — I5042 Chronic combined systolic (congestive) and diastolic (congestive) heart failure: Secondary | ICD-10-CM

## 2022-01-28 DIAGNOSIS — Z951 Presence of aortocoronary bypass graft: Secondary | ICD-10-CM

## 2022-01-28 MED ORDER — ROSUVASTATIN CALCIUM 20 MG PO TABS: 20.0000 mg | ORAL_TABLET | Freq: Every day | ORAL | 3 refills | Status: DC

## 2022-01-28 MED ORDER — SPIRONOLACTONE 25 MG PO TABS: 12.5000 mg | ORAL_TABLET | Freq: Every day | ORAL | 3 refills | Status: DC

## 2022-01-28 NOTE — Patient Instructions (Signed)
Medication Instructions:  ? ?Your physician recommends that you continue on your current medications as directed. Please refer to the Current Medication list given to you today. ? ? ?*If you need a refill on your cardiac medications before your next appointment, please call your pharmacy* ? ?Testing/Procedures: ? ?Your physician has requested that you have an echocardiogram. Echocardiography is a painless test that uses sound waves to create images of your heart. It provides your doctor with information about the size and shape of your heart and how well your heart?s chambers and valves are working. This procedure takes approximately one hour. There are no restrictions for this procedure. ? ? ? ?Follow-Up: ?At [facility], you and your health needs are our priority.  As part of our continuing mission to provide you with exceptional heart care, we have created designated Provider Care Teams.  These Care Teams include your primary Cardiologist (physician) and Advanced Practice Providers (APPs -  Physician Assistants and Nurse Practitioners) who all work together to provide you with the care you need, when you need it. ? ?We recommend signing up for the patient portal called "MyChart".  Sign up information is provided on this After Visit Summary.  MyChart is used to connect with patients for Virtual Visits (Telemedicine).  Patients are able to view lab/test results, encounter notes, upcoming appointments, etc.  Non-urgent messages can be sent to your provider as well.   ?To learn more about what you can do with MyChart, go to [email or web address].   ? ?Your next appointment:   ?6 month(s) ? ?The format for your next appointment:   ?In Person ? ?Provider:   ?Jenkins Rouge, MD   ? ? ?Other Instructions ? ?Mediterranean Diet ?A Mediterranean diet refers to food and lifestyle choices that are based on the traditions of countries located on the [employer]. It focuses on eating more fruits, vegetables, whole  grains, beans, nuts, seeds, and heart-healthy fats, and eating less dairy, meat, eggs, and processed foods with added sugar, salt, and fat. This way of eating has been shown to help prevent certain conditions and improve outcomes for people who have chronic diseases, like kidney disease and heart disease. ?What are tips for following this plan? ?Reading food labels ?Check the serving size of packaged foods. For foods such as rice and pasta, the serving size refers to the amount of cooked product, not dry. ?Check the total fat in packaged foods. Avoid foods that have saturated fat or trans fats. ?Check the ingredient list for added sugars, such as corn syrup. ?Shopping ? ?Buy a variety of foods that offer a balanced diet, including: ?Fresh fruits and vegetables (produce). ?Grains, beans, nuts, and seeds. Some of these may be available in unpackaged forms or large amounts (in bulk). ?Fresh seafood. ?Poultry and eggs. ?Low-fat dairy products. ?Buy whole ingredients instead of prepackaged foods. ?Buy fresh fruits and vegetables in-season from local farmers markets. ?Buy plain frozen fruits and vegetables. ?If you do not have access to quality fresh seafood, buy precooked frozen shrimp or canned fish, such as tuna, salmon, or sardines. ?Stock your pantry so you always have certain foods on hand, such as olive oil, canned tuna, canned tomatoes, rice, pasta, and beans. ?Cooking ?Cook foods with extra-virgin olive oil instead of using butter or other vegetable oils. ?Have meat as a side dish, and have vegetables or grains as your main dish. This means having meat in small portions or adding small amounts of meat to foods like pasta or stew. ?Use  beans or vegetables instead of meat in common dishes like chili or lasagna. ?Experiment with different cooking methods. Try roasting, broiling, steaming, and saut?ing vegetables. ?Add frozen vegetables to soups, stews, pasta, or rice. ?Add nuts or seeds for added healthy fats and  plant protein at each meal. You can add these to yogurt, salads, or vegetable dishes. ?Marinate fish or vegetables using olive oil, lemon juice, garlic, and fresh herbs. ?Meal planning ?Plan to eat one vegetarian meal one day each week. Try to work up to two vegetarian meals, if possible. ?Eat seafood two or more times a week. ?Have healthy snacks readily available, such as: ?Vegetable sticks with hummus. ?[place] yogurt. ?Fruit and nut trail mix. ?Eat balanced meals throughout the week. This includes: ?Fruit: 2-3 servings a day. ?Vegetables: 4-5 servings a day. ?Low-fat dairy: 2 servings a day. ?Fish, poultry, or lean meat: 1 serving a day. ?Beans and legumes: 2 or more servings a week. ?Nuts and seeds: 1-2 servings a day. ?Whole grains: 6-8 servings a day. ?Extra-virgin olive oil: 3-4 servings a day. ?Limit red meat and sweets to only a few servings a month. ?Lifestyle ? ?Cook and eat meals together with your family, when possible. ?Drink enough fluid to keep your urine pale yellow. ?Be physically active every day. This includes: ?Aerobic exercise like running or swimming. ?Leisure activities like gardening, walking, or housework. ?Get 7-8 hours of sleep each night. ?If recommended by your health care provider, drink red wine in moderation. This means 1 glass a day for nonpregnant women and 2 glasses a day for men. A glass of wine equals 5 oz (150 mL). ?What foods should I eat? ?Fruits ?Apples. Apricots. Avocado. Berries. Bananas. Cherries. Dates. Figs. Grapes. Lemons. Melon. Oranges. Peaches. Plums. Pomegranate. ?Vegetables ?Artichokes. Beets. Broccoli. Cabbage. Carrots. Eggplant. Green beans. Chard. Kale. Spinach. Onions. Leeks. Peas. Squash. Tomatoes. Peppers. Radishes. ?Grains ?Whole-grain pasta. Brown rice. Bulgur wheat. Polenta. Couscous. Whole-wheat bread. [place]. ?Meats and other proteins ?Beans. Almonds. Sunflower seeds. Pine nuts. Peanuts. [place]. Salmon. Scallops. Shrimp. [place]. Tilapia. Clams.  Oysters. Eggs. Poultry without skin. ?Dairy ?Low-fat milk. Cheese. Greek yogurt. ?Fats and oils ?Extra-virgin olive oil. Avocado oil. Grapeseed oil. ?Beverages ?Water. Red wine. Herbal tea. ?Sweets and desserts ?Greek yogurt with honey. Baked apples. Poached pears. Trail mix. ?Seasonings and condiments ?Basil. Cilantro. Coriander. Cumin. Mint. Parsley. Sage. Rosemary. Tarragon. Garlic. Oregano. Thyme. Pepper. Balsamic vinegar. Tahini. Hummus. Tomato sauce. Olives. Mushrooms. ?The items listed above may not be a complete list of foods and beverages you can eat. Contact a dietitian for more information. ?What foods should I limit? ?This is a list of foods that should be eaten rarely or only on special occasions. ?Fruits ?Fruit canned in syrup. ?Vegetables ?Deep-fried potatoes (french fries). ?Grains ?Prepackaged pasta or rice dishes. Prepackaged cereal with added sugar. Prepackaged snacks with added sugar. ?Meats and other proteins ?Beef. Pork. Lamb. Poultry with skin. Hot dogs. [place] Salines. ?Dairy ?Ice cream. Sour cream. Whole milk. ?Fats and oils ?Butter. Canola oil. Vegetable oil. Beef fat (tallow). Lard. ?Beverages ?Juice. Sugar-sweetened soft drinks. Beer. Liquor and spirits. ?Sweets and desserts ?Cookies. Cakes. Pies. Candy. ?Seasonings and condiments ?Mayonnaise. Pre-made sauces and marinades. ?The items listed above may not be a complete list of foods and beverages you should limit. Contact a dietitian for more information. ?Summary ?The Mediterranean diet includes both food and lifestyle choices. ?Eat a variety of fresh fruits and vegetables, beans, nuts, seeds, and whole grains. ?Limit the amount of red meat and sweets that you eat. ?If  recommended by your health care provider, drink red wine in moderation. This means 1 glass a day for nonpregnant women and 2 glasses a day for men. A glass of wine equals 5 oz (150 mL). ?This information is not intended to replace advice given to you by your health care provider.  Make sure you discuss any questions you have with your health care provider. ?Document Revised: 11/23/2019 Document Reviewed: 09/20/2019 ?Elsevier Patient Education ? [place]. ?  ?

## 2022-01-28 NOTE — Telephone Encounter (Signed)
Please call the patient and tell him I have reviewed his labs: ? ?1. Start spironolactone 12.5 mg once daily. Check BP and let us know if it is consistently < 100. Will need bmet in 7-10 days. Day of echo is too early to recheck but if he wants to move echo that is fine. ? ?2. LDL 125. Way too high. Will he verify that he is taking fenofibrate, zetia, and rosuvastatin at the doses we have listed. If so, please increase rosuvastatin to 40 mg daily and recheck lipids/lfts in 8 weeks. ?If not compliant, restart at doses listed on med list and recheck lipids/lfts in 8 weeks.  ?

## 2022-01-28 NOTE — Telephone Encounter (Signed)
S/w pt with recommendations. Pt did not have rosuvastatin. Pr will restart rosuvastatin one (1) tablet by mouth ( 20 mg) daily.  Pt will start Aldactone one half tablet by mouth ( 12.5 mg) daily. Pt does not have a BP cuff.  Office is giving pt cuff and will show how to use on Monday April 3 when pt comes in for echo appt. Pt is to let us know if bp is consistently < 100 systolic . Will give pt bp log and include this at top of paper. Pt will get BMET at the [facility] office on April 7, will give pt requisition on Monday.  Pt will get LIPID/LFT on May 12 at the [facility] location, pt will get requisition paperwork on Monday.  ?

## 2022-02-01 ENCOUNTER — Ambulatory Visit (HOSPITAL_COMMUNITY): Payer: Commercial Managed Care - HMO | Attending: Cardiology

## 2022-02-01 ENCOUNTER — Telehealth: Payer: Self-pay | Admitting: *Deleted

## 2022-02-01 DIAGNOSIS — I10 Essential (primary) hypertension: Secondary | ICD-10-CM | POA: Insufficient documentation

## 2022-02-01 DIAGNOSIS — E782 Mixed hyperlipidemia: Secondary | ICD-10-CM

## 2022-02-01 DIAGNOSIS — I255 Ischemic cardiomyopathy: Secondary | ICD-10-CM | POA: Insufficient documentation

## 2022-02-01 DIAGNOSIS — I252 Old myocardial infarction: Secondary | ICD-10-CM | POA: Insufficient documentation

## 2022-02-01 DIAGNOSIS — Z951 Presence of aortocoronary bypass graft: Secondary | ICD-10-CM | POA: Insufficient documentation

## 2022-02-01 LAB — ECHOCARDIOGRAM COMPLETE
Area-P 1/2: 3.65 cm2
S' Lateral: 2.55 cm

## 2022-02-01 NOTE — Telephone Encounter (Signed)
Pt came in today for echo appt.  Pt was to pick up bp cuff and come back to show pt how to use bp cuff.  Went through all instructions.  Pt received a bp cuff, bp log, two sets of envelopes with lab requisitions in the envelope for two separate lab appt in [place].  ?

## 2022-02-05 NOTE — Addendum Note (Signed)
Addended by: Eleonore Chiquito on: 02/05/2022 12:00 PM ? ? Modules accepted: Orders ? ?

## 2022-02-06 LAB — BASIC METABOLIC PANEL
BUN/Creatinine Ratio: 9 — ABNORMAL LOW (ref 10–24)
BUN: 10 mg/dL (ref 8–27)
CO2: 25 mmol/L (ref 20–29)
Calcium: 9.7 mg/dL (ref 8.6–10.2)
Chloride: 104 mmol/L (ref 96–106)
Creatinine, Ser: 1.1 mg/dL (ref 0.76–1.27)
Glucose: 118 mg/dL — ABNORMAL HIGH (ref 70–99)
Potassium: 4.2 mmol/L (ref 3.5–5.2)
Sodium: 142 mmol/L (ref 134–144)
eGFR: 77 mL/min/{1.73_m2} (ref >59)

## 2022-02-06 LAB — LIPID PANEL
Chol/HDL Ratio: 2.7 ratio (ref 0.0–5.0)
Cholesterol, Total: 133 mg/dL (ref 100–199)
HDL: 50 mg/dL (ref >39)
LDL Chol Calc (NIH): 68 mg/dL (ref 0–99)
Triglycerides: 73 mg/dL (ref 0–149)
VLDL Cholesterol Cal: 15 mg/dL (ref 5–40)

## 2022-02-06 LAB — HEPATIC FUNCTION PANEL
ALT: 25 IU/L (ref 0–44)
AST: 37 IU/L (ref 0–40)
Albumin: 5 g/dL — ABNORMAL HIGH (ref 3.8–4.9)
Alkaline Phosphatase: 55 IU/L (ref 44–121)
Bilirubin Total: 0.5 mg/dL (ref 0.0–1.2)
Bilirubin, Direct: 0.19 mg/dL (ref 0.00–0.40)
Total Protein: 6.9 g/dL (ref 6.0–8.5)

## 2022-03-12 LAB — HEPATIC FUNCTION PANEL
ALT: 30 IU/L (ref 0–44)
AST: 40 IU/L (ref 0–40)
Albumin: 5.3 g/dL — ABNORMAL HIGH (ref 3.8–4.9)
Alkaline Phosphatase: 50 IU/L (ref 44–121)
Bilirubin Total: 0.8 mg/dL (ref 0.0–1.2)
Bilirubin, Direct: 0.24 mg/dL (ref 0.00–0.40)
Total Protein: 7.5 g/dL (ref 6.0–8.5)

## 2022-03-12 LAB — LIPID PANEL
Chol/HDL Ratio: 3 ratio (ref 0.0–5.0)
Cholesterol, Total: 140 mg/dL (ref 100–199)
HDL: 47 mg/dL (ref >39)
LDL Chol Calc (NIH): 75 mg/dL (ref 0–99)
Triglycerides: 93 mg/dL (ref 0–149)
VLDL Cholesterol Cal: 18 mg/dL (ref 5–40)

## 2022-03-16 ENCOUNTER — Other Ambulatory Visit: Payer: Self-pay | Admitting: *Deleted

## 2022-03-16 DIAGNOSIS — E785 Hyperlipidemia, unspecified: Secondary | ICD-10-CM

## 2022-06-22 ENCOUNTER — Telehealth: Payer: Self-pay | Admitting: Cardiovascular Disease

## 2022-06-22 NOTE — Telephone Encounter (Signed)
Pt needing a record of echo and letter stating that he is safe to drive to go with DOT he is getting with Urgent Care today. Requesting call back to discuss how to go about getting echo results and letter.

## 2022-06-22 NOTE — Telephone Encounter (Signed)
Requesting a copy of echocardiogram and letter from Dr. Eden Emms for DOT expressing safe to drive.  Would like a phone call when  request is ready for pick up.  Advised Dr. Eden Emms and nurse are not in the office today.  Will route to MD and RN to follow up.

## 2022-06-23 NOTE — Progress Notes (Signed)
Note done

## 2022-06-24 NOTE — Telephone Encounter (Signed)
Per Dr. Eden Emms, [facility] to write letter indicating EF returned to normal post CABG x 2 with no angina and clear for DOT . Letter written and signed by Dr. Eden Emms. Left letter and echo report at front desk for patient to pick up.

## 2022-09-16 ENCOUNTER — Ambulatory Visit: Payer: Commercial Managed Care - HMO | Admitting: Neurology

## 2022-09-16 ENCOUNTER — Encounter: Payer: Self-pay | Admitting: Neurology

## 2022-09-16 VITALS — BP 134/67 | HR 58 | Ht 70.5 in | Wt 163.0 lb

## 2022-09-16 DIAGNOSIS — I214 Non-ST elevation (NSTEMI) myocardial infarction: Secondary | ICD-10-CM | POA: Diagnosis not present

## 2022-09-16 DIAGNOSIS — G35 Multiple sclerosis: Secondary | ICD-10-CM | POA: Diagnosis not present

## 2022-09-16 DIAGNOSIS — R269 Unspecified abnormalities of gait and mobility: Secondary | ICD-10-CM | POA: Insufficient documentation

## 2022-09-16 DIAGNOSIS — E782 Mixed hyperlipidemia: Secondary | ICD-10-CM

## 2022-09-16 DIAGNOSIS — I10 Essential (primary) hypertension: Secondary | ICD-10-CM

## 2022-09-16 DIAGNOSIS — Z79899 Other long term (current) drug therapy: Secondary | ICD-10-CM | POA: Diagnosis not present

## 2022-09-16 NOTE — Progress Notes (Signed)
[facility]  PATIENT: Gregory Gardner DOB: [birth year]  REFERRING DOCTOR OR PCP:  Charmayne Sheer, MD; Caffie Damme, MD SOURCE: Patient, notes from [facility], imaging and lab reports, MRI images personally reviewed.  _________________________________   HISTORICAL  CHIEF COMPLAINT:  Chief Complaint  Patient presents with   New Patient (Initial Visit)    RM 1, alone. Paper referral for MS/TOC from Dr. Maple Hudson. Last Ocrevus about a year and a half ago.  Having vision issues in the last 6 months. Last eye appt about a yr ago. Eye doctor: [facility]  [address] [place], [place] [address] [phone]    HISTORY OF PRESENT ILLNESS:  I had the pleasure of seeing your patient, Gregory Gardner, at the [facility] Center at [facility] for neurologic consultation regarding his multiple sclerosis  He is a 61 year old man diagnosed with MS in 1999 after presenting with gait disturbance.   He had imaging studies c/w MS but reports he did not see a neurologist.unitl a few years later (Dr. Adella Hare).  He was initially on Betaseron.  He felt poorly on it and also had an exacerbation with poor balance.  Therefore, he switched to Tecfidera around 2012/2013.   He started seeing Dr. Maple Hudson after Dr. Adella Hare retired.   Due to some progression, he was switched to Ocrevus and 2020 - last infusion was late 2022, reportedly due to insurance issues.   He feels he has not had a clinical exacerbation x years but has had some progression of gait and vision issues.     Currently, gait is stable and he can walk a couple miles.   He does not need the rail to go downstairs but sometimes uses it.   His left leg is weak and numb with mild spasticity at times.  Arms are strong and no numbness.    He has urinary urgency and very rare incontinence, sometimes at night.    He has reduced vision, OS worse than OD  Changes have seemed gradual.   He denies vertigo.  He has some fatigue but tries  to stay active.    He sleeps ok many nights but he has some dysesthesias and pain.      He denies mood or cognitive issues.  He has CAD and has had MI x 2.  He has had CABG twice.   He has HTN and elevated cholesterol and mild Type 2 NIDDM (not on any medication).  He also has had elevated CRT in 2023 but not 2022. , He denies heart failure.     He also reports LBP  Imaging: I personally reviewed MRI of the head 11/11/2016.  It shows multiple T2/FLAIR hyperintense foci in the periventricular white matter with some foci in the deep white matter.  None of them enhanced.  MRI of the cervical spine 11/11/2016 showed hyperintense foci posteriorly at C2-C3, anteriorly at C5, laterally to the left at C6.  MRI of the thoracic spine showed some foci in the mid thoracic spine.  None of the foci enhanced after contrast  MRI lumbar reports show "Small central focal disc protrusion at L5-S1 may impinge on the traversing S1 nerve roots.  Minimal multifactorial spinal and foraminal stenosis at L3-4 and L4-5   LABS: He was JCV Ab positive and anti-NMO negative and Hep B core IgG/IgM negative 08/03/2017  REVIEW OF SYSTEMS: Constitutional: No fevers, chills, sweats, or change in appetite Eyes: No visual changes, double vision, eye pain Ear,  nose and throat: No hearing loss, ear pain, nasal congestion, sore throat Cardiovascular: No chest pain, palpitations.  He has CAD/MI and history of CABG Respiratory:  No shortness of breath at rest or with exertion.   No wheezes GastrointestinaI: No nausea, vomiting, diarrhea, abdominal pain, fecal incontinence Genitourinary:  No dysuria, urinary retention or frequency.  No nocturia. Musculoskeletal: He reports back pain and sometimes leg pain. Integumentary: No rash, pruritus, skin lesions Neurological: as above Psychiatric: No depression at this time.  No anxiety Endocrine: No palpitations, diaphoresis, change in appetite, change in weigh or increased  thirst Hematologic/Lymphatic:  No anemia, purpura, petechiae. Allergic/Immunologic: No itchy/runny eyes, nasal congestion, recent allergic reactions, rashes  ALLERGIES: Allergies  Allergen Reactions   Lodine [Etodolac] Nausea Only    HOME MEDICATIONS:  Current Outpatient Medications:    Albuterol Sulfate (VENTOLIN HFA IN), Inhale 1 puff into the lungs daily as needed (for wheezing). , Disp: , Rfl:    alendronate (FOSAMAX) 70 MG tablet, Take 70 mg by mouth once a week. Take with a full glass of water on an empty stomach., Disp: , Rfl:    Aspirin-Salicylamide-Caffeine (BC HEADACHE POWDER PO), Take by mouth as needed., Disp: , Rfl:    clopidogrel (PLAVIX) 75 MG tablet, Take 1 tablet (75 mg total) by mouth daily., Disp: 90 tablet, Rfl: 3   ezetimibe (ZETIA) 10 MG tablet, Take 10 mg by mouth daily., Disp: , Rfl:    fenofibrate micronized (LOFIBRA) 134 MG capsule, Take 134 mg by mouth daily., Disp: , Rfl:    losartan (COZAAR) 25 MG tablet, TAKE 1 TABLET BY MOUTH DAILY, Disp: 90 tablet, Rfl: 3   pantoprazole (PROTONIX) 40 MG tablet, Take 1 tablet (40 mg total) by mouth daily., Disp: 30 tablet, Rfl: 2   rosuvastatin (CRESTOR) 20 MG tablet, Take 1 tablet (20 mg total) by mouth daily., Disp: 90 tablet, Rfl: 3   spironolactone (ALDACTONE) 25 MG tablet, Take 0.5 tablets (12.5 mg total) by mouth daily., Disp: 45 tablet, Rfl: 3   VITAMIN D PO, Take 50,000 Units by mouth once a week., Disp: , Rfl:    nitroGLYCERIN (NITROSTAT) 0.4 MG SL tablet, Place 0.4 mg under the tongue every 5 (five) minutes as needed for chest pain.  (Patient not taking: Reported on 09/16/2022), Disp: , Rfl:    ocrelizumab 300 mg in sodium chloride 0.9 % 250 mL, Inject 300 mg into the vein every 6 (six) months. (Patient not taking: Reported on 09/16/2022), Disp: , Rfl:   PAST MEDICAL HISTORY: Past Medical History:  Diagnosis Date   Asthma    CAD (coronary artery disease)    s/p Inf STEMI >> S/p CABG in 2003 // Myoview 11/2018:  low risk // s/p NSTEMI 8/21 >> s/p redo CABG   Ischemic cardiomyopathy    [place] 8/21: EF 40-45, anterior/anterolateral HK, moderate asymmetric LVH, GR 1 DD, normal RVSF, RVSP 23.2   Mixed hyperlipidemia    Intol of statins (?elevated LFTs)   MRSA (methicillin resistant Staphylococcus aureus)    Multiple sclerosis (HCC)    Seasonal allergies     PAST SURGICAL HISTORY: Past Surgical History:  Procedure Laterality Date   CORONARY ARTERY BYPASS GRAFT N/A 06/18/2020   Procedure: REDO CORONARY ARTERY BYPASS GRAFTING (CABG), ON PUMP, TIMES THREE, USING RIGHT INTERNAL MAMMARY ARTERY AND ENDOSCOPICALLY HARVESTED LEFT GREATER SAPHENOUS VEIN;  Surgeon: Delight Ovens, MD;  Location: [facility] OR;  Service: Open Heart Surgery;  Laterality: N/A;  SEQENTIAL DIAG TO OM RIMA TO PREVIOUS DISTAL  RIGHT VEIN GRAFT   ENDOVEIN HARVEST OF GREATER SAPHENOUS VEIN Left 06/18/2020   Procedure: ENDOVEIN HARVEST OF GREATER SAPHENOUS VEIN;  Surgeon: Delight Ovens, MD;  Location: [facility] OR;  Service: Open Heart Surgery;  Laterality: Left;   LEFT HEART CATH AND CORS/GRAFTS ANGIOGRAPHY N/A 06/16/2020   Procedure: LEFT HEART CATH AND CORS/GRAFTS ANGIOGRAPHY;  Surgeon: Lyn Records, MD;  Location: [facility] INVASIVE CV LAB;  Service: Cardiovascular;  Laterality: N/A;   MANDIBLE SURGERY     TEE WITHOUT CARDIOVERSION N/A 06/18/2020   Procedure: TRANSESOPHAGEAL ECHOCARDIOGRAM (TEE);  Surgeon: Delight Ovens, MD;  Location: [facility] OR;  Service: Open Heart Surgery;  Laterality: N/A;    FAMILY HISTORY: Family History  Problem Relation Age of Onset   Heart Problems Mother    Heart Problems Father    Heart attack Father    Heart Problems Sister    Heart Problems Brother     SOCIAL HISTORY: Social History   Socioeconomic History   Marital status: Married    Spouse name: Not on file   Number of children: Not on file   Years of education: Not on file   Highest education level: Not on file  Occupational History   Not on file   Tobacco Use   Smoking status: Never   Smokeless tobacco: Never  Substance and Sexual Activity   Alcohol use: Yes    Comment: Beer- socially   Drug use: No   Sexual activity: Not on file  Other Topics Concern   Not on file  Social History Narrative   Right handed   Caffeine use: Diet coke daily   Lives with dogs   Social Determinants of Health   Financial Resource Strain: Not on file  Food Insecurity: Not on file  Transportation Needs: Not on file  Physical Activity: Not on file  Stress: Not on file  Social Connections: Not on file  Intimate Partner Violence: Not on file       PHYSICAL EXAM  Vitals:   09/16/22 0841  BP: 134/67  Pulse: (!) 58  Weight: 163 lb (73.9 kg)  Height: 5' 10.5" (1.791 m)    Body mass index is 23.06 kg/m.  Vision Screening   Right eye Left eye Both eyes  Without correction 20/30 20/50 20/20   With correction        General: The patient is well-developed and well-nourished and in no acute distress  HEENT:  Head is [place]/AT.  Sclera are anicteric.  Funduscopic exam shows normal optic discs and retinal vessels.  Neck: No carotid bruits are noted.  The neck is nontender.  Cardiovascular: The heart has a regular rate and rhythm with a normal S1 and S2. There were no murmurs, gallops or rubs.    Skin: Extremities are without rash or  edema.  Musculoskeletal:  Back is nontender  Neurologic Exam  Mental status: The patient is alert and oriented x 3 at the time of the examination. The patient has apparent normal recent and remote memory, with an apparently normal attention span and concentration ability.   Speech is normal.  Cranial nerves: Extraocular movements are full. Pupils are equal, round, and reactive to light and accomodation.  Reduced color vision OS.   Facial symmetry is present. There is good facial sensation to soft touch bilaterally.Facial strength is normal.  Trapezius and sternocleidomastoid strength is normal. No dysarthria  is noted.  The tongue is midline, and the patient has symmetric elevation of the soft palate. No obvious hearing  deficits are noted.  Motor:  Muscle bulk is normal.   Tone is normal. Strength is  5 / 5 in all 4 extremities except 4+/5 iliopsoas on the left.   Sensory: Sensory testing is intact to pinprick, soft touch and vibration sensation in the arms and reduced vibration sensation in the left leg.  Intact touch sensation in the legs.  Coordination: Cerebellar testing reveals good finger-nose-finger and heel-to-shin bilaterally.  Gait and station: Station is normal.   Gait is normal. Tandem gait is mildly wide. Romberg is negative.   Reflexes: Deep tendon reflexes are symmetric and normal in the arms and increased at the knees, 3+ with crossed abductors.  Ankles are 3 with no clonus.   Plantar responses are flexor.    DIAGNOSTIC DATA (LABS, IMAGING, TESTING) - I reviewed patient records, labs, notes, testing and imaging myself where available.  Lab Results  Component Value Date   WBC 13.7 (H) 06/21/2020   HGB 10.9 (L) 06/21/2020   HCT 32.3 (L) 06/21/2020   MCV 93.1 06/21/2020   PLT 112 (L) 06/21/2020      Component Value Date/Time   NA 142 02/05/2022 1206   K 4.2 02/05/2022 1206   CL 104 02/05/2022 1206   CO2 25 02/05/2022 1206   GLUCOSE 118 (H) 02/05/2022 1206   GLUCOSE 114 (H) 06/23/2020 0219   BUN 10 02/05/2022 1206   CREATININE 1.10 02/05/2022 1206   CALCIUM 9.7 02/05/2022 1206   PROT 7.5 03/12/2022 0911   ALBUMIN 5.3 (H) 03/12/2022 0911   AST 40 03/12/2022 0911   ALT 30 03/12/2022 0911   ALKPHOS 50 03/12/2022 0911   BILITOT 0.8 03/12/2022 0911   GFRNONAA >60 06/23/2020 0219   GFRAA >60 06/23/2020 0219   Lab Results  Component Value Date   CHOL 140 03/12/2022   HDL 47 03/12/2022   LDLCALC 75 03/12/2022   TRIG 93 03/12/2022   CHOLHDL 3.0 03/12/2022   Lab Results  Component Value Date   HGBA1C 6.5 (H) 06/18/2020   No results found for:  "VITAMINB12"     ASSESSMENT AND PLAN  Multiple sclerosis (HCC) - Plan: Hepatitis B surface antigen, HIV Antibody (routine testing w rflx), IgG, IgA, IgM, QuantiFERON-TB Gold Plus, Varicella zoster antibody, IgG, Hepatitis C antibody, Hepatitis B surface antibody,qualitative, Hepatitis B core antibody, total, CBC with Differential/Platelet, Comprehensive metabolic panel  High risk medication use - Plan: Hepatitis B surface antigen, HIV Antibody (routine testing w rflx), IgG, IgA, IgM, QuantiFERON-TB Gold Plus, Varicella zoster antibody, IgG, Hepatitis C antibody, Hepatitis B surface antibody,qualitative, Hepatitis B core antibody, total, CBC with Differential/Platelet, Comprehensive metabolic panel  Gait disturbance  Non-ST elevation (NSTEMI) myocardial infarction (HCC)  Mixed hyperlipidemia  Primary hypertension  In summary, Mr. Gahm is a 61 year old man who was diagnosed with MS in 1999.  Although he has several spinal cord plaques, he is fortunate to have only mild neurologic impairment at this time.  I do think he needs to continue on a disease modifying therapy for least a few more years.  We discussed options.  He tolerated Ocrevus well and we can have him go back on Ocrevus or Briumvi.  Alternatively, we could consider Mavenclad as this would allow him to get many years of benefit with only a few weeks of treatment to improve compliance.  Additionally, this may be safer than the anti-CD20 agents at this age as the effect in the immune system is more transient.  I went over the risks and benefits of both  options.  We will check lab work and he will give this some more thought over the next week while we wait for the results.  He will return to see me in 5 to 6 months or sooner if there are new or worsening neurologic symptoms.  Thank you for asking me to see Mr. Cieslik, please let me know if I can be of further assistance with him or other patients in the future.  Makenli Derstine A. Epimenio Foot, MD,  [facility] 09/16/2022, 10:53 AM Certified in Neurology, Clinical Neurophysiology, Sleep Medicine and Neuroimaging  [facility] [address], [address] [place], [place] [address] [phone]

## 2022-09-19 LAB — CBC WITH DIFFERENTIAL/PLATELET
Basophils Absolute: 0 10*3/uL (ref 0.0–0.2)
Basos: 1 %
EOS (ABSOLUTE): 0.1 10*3/uL (ref 0.0–0.4)
Eos: 2 %
Hematocrit: 43.9 % (ref 37.5–51.0)
Hemoglobin: 14.5 g/dL (ref 13.0–17.7)
Immature Grans (Abs): 0 10*3/uL (ref 0.0–0.1)
Immature Granulocytes: 1 %
Lymphocytes Absolute: 0.9 10*3/uL (ref 0.7–3.1)
Lymphs: 13 %
MCH: 30.7 pg (ref 26.6–33.0)
MCHC: 33 g/dL (ref 31.5–35.7)
MCV: 93 fL (ref 79–97)
Monocytes Absolute: 0.3 10*3/uL (ref 0.1–0.9)
Monocytes: 5 %
Neutrophils Absolute: 5.8 10*3/uL (ref 1.4–7.0)
Neutrophils: 78 %
Platelets: 225 10*3/uL (ref 150–450)
RBC: 4.73 x10E6/uL (ref 4.14–5.80)
RDW: 12.3 % (ref 11.6–15.4)
WBC: 7.3 10*3/uL (ref 3.4–10.8)

## 2022-09-19 LAB — COMPREHENSIVE METABOLIC PANEL
ALT: 31 IU/L (ref 0–44)
AST: 34 IU/L (ref 0–40)
Albumin/Globulin Ratio: 2.2 (ref 1.2–2.2)
Albumin: 5 g/dL — ABNORMAL HIGH (ref 3.9–4.9)
Alkaline Phosphatase: 49 IU/L (ref 44–121)
BUN/Creatinine Ratio: 16 (ref 10–24)
BUN: 15 mg/dL (ref 8–27)
Bilirubin Total: 0.6 mg/dL (ref 0.0–1.2)
CO2: 24 mmol/L (ref 20–29)
Calcium: 9.6 mg/dL (ref 8.6–10.2)
Chloride: 102 mmol/L (ref 96–106)
Creatinine, Ser: 0.91 mg/dL (ref 0.76–1.27)
Globulin, Total: 2.3 g/dL (ref 1.5–4.5)
Glucose: 122 mg/dL — ABNORMAL HIGH (ref 70–99)
Potassium: 4.2 mmol/L (ref 3.5–5.2)
Sodium: 141 mmol/L (ref 134–144)
Total Protein: 7.3 g/dL (ref 6.0–8.5)
eGFR: 96 mL/min/{1.73_m2} (ref >59)

## 2022-09-19 LAB — VARICELLA ZOSTER ANTIBODY, IGG: Varicella zoster IgG: 1961 index (ref >165)

## 2022-09-19 LAB — HIV ANTIBODY (ROUTINE TESTING W REFLEX): HIV Screen 4th Generation wRfx: NONREACTIVE

## 2022-09-19 LAB — HEPATITIS B SURFACE ANTIBODY,QUALITATIVE: Hep B Surface Ab, Qual: NONREACTIVE

## 2022-09-19 LAB — QUANTIFERON-TB GOLD PLUS
QuantiFERON Mitogen Value: >10 IU/mL
QuantiFERON Nil Value: 0.02 IU/mL
QuantiFERON TB1 Ag Value: 0.05 IU/mL
QuantiFERON TB2 Ag Value: 0.05 IU/mL
QuantiFERON-TB Gold Plus: NEGATIVE

## 2022-09-19 LAB — HEPATITIS C ANTIBODY: Hep C Virus Ab: NONREACTIVE

## 2022-09-19 LAB — IGG, IGA, IGM
IgA/Immunoglobulin A, Serum: 160 mg/dL (ref 61–437)
IgG (Immunoglobin G), Serum: 764 mg/dL (ref 603–1613)
IgM (Immunoglobulin M), Srm: 26 mg/dL (ref 20–172)

## 2022-09-19 LAB — HEPATITIS B SURFACE ANTIGEN: Hepatitis B Surface Ag: NEGATIVE

## 2022-09-19 LAB — HEPATITIS B CORE ANTIBODY, TOTAL: Hep B Core Total Ab: NEGATIVE

## 2022-09-27 ENCOUNTER — Telehealth: Payer: Self-pay

## 2022-09-27 NOTE — Telephone Encounter (Signed)
Faxed Mavenclad start form to [employer]. Received a receipt of confirmation.  

## 2022-09-27 NOTE — Telephone Encounter (Signed)
-----   Message from Asa Lente, MD sent at 09/22/2022  5:15 PM EST ----- His lbs ar fine.  I spoke to Mr. Kozloski and we will start MAvenclad - he signed a form last viist

## 2022-09-27 NOTE — Telephone Encounter (Signed)
Mavenclad PA completed via CMM. Sent to [employer]. Key: [id number]. Should have a determination within 1-3 business days.

## 2022-09-28 NOTE — Telephone Encounter (Signed)
PA for [facility] approved: "CaseId:[id number];Status:Approved;Review Type:Prior Auth;Coverage Start Date:09/27/2022;Coverage End Date:09/27/2023;"

## 2022-10-12 NOTE — Telephone Encounter (Signed)
I called patient. He is due to receive his Mavenclad today. I will check on him next week and schedule a 2 month follow up with [facility].

## 2022-10-21 NOTE — Telephone Encounter (Signed)
Placed letter in mail.

## 2022-10-21 NOTE — Telephone Encounter (Signed)
I called patient.  He started his Mavenclad 2 days ago.  He is doing well.  He scheduled a follow-up appointment for February 26 at 1 PM with Dr. Epimenio Foot.  He asked that I mail him a letter reminding him of this appointment.  I verified the address we have on file is correct.  Patient will let us know if he has interim questions or concerns.

## 2022-12-27 ENCOUNTER — Ambulatory Visit: Payer: Commercial Managed Care - HMO | Admitting: Neurology

## 2022-12-27 VITALS — BP 125/68 | HR 66 | Ht 70.5 in | Wt 160.2 lb

## 2022-12-27 DIAGNOSIS — G35D Multiple sclerosis, unspecified: Secondary | ICD-10-CM

## 2022-12-27 DIAGNOSIS — R208 Other disturbances of skin sensation: Secondary | ICD-10-CM | POA: Diagnosis not present

## 2022-12-27 DIAGNOSIS — R269 Unspecified abnormalities of gait and mobility: Secondary | ICD-10-CM

## 2022-12-27 DIAGNOSIS — G35 Multiple sclerosis: Secondary | ICD-10-CM | POA: Diagnosis not present

## 2022-12-27 DIAGNOSIS — Z79899 Other long term (current) drug therapy: Secondary | ICD-10-CM | POA: Diagnosis not present

## 2022-12-27 MED ORDER — PREGABALIN 150 MG PO CAPS: 150.0000 mg | ORAL_CAPSULE | Freq: Every day | ORAL | 5 refills | Status: DC

## 2022-12-27 NOTE — Progress Notes (Signed)
[facility]  PATIENT: Gregory Gardner DOB: 1961/10/10  REFERRING DOCTOR OR PCP:  Creig Hines, MD; Glendon Axe, MD SOURCE: Patient, notes from [facility], imaging and lab reports, MRI images personally reviewed.  _________________________________   HISTORICAL  CHIEF COMPLAINT:  Chief Complaint  Patient presents with   Follow-up    RM 10, alone. Last seen 09/16/22. MS DMT: Mavenclad. No concerns, no new sx.    HISTORY OF PRESENT ILLNESS:   Gregory Gardner is a 62 y.o. man with multiple sclerosis  UPDATE 12/27/2022: He did the two weeks of Mavenclad December 2023 and January 2024.   He felt poorly the second week but no rash/itchong.  His gait is stable and he can walk a couple miles.   He does not need the rail to go downstairs..   His left leg is mildly weak and numb with mild spasticity at times.  He also reports LBP.  His arms are strong and no numbness.    He has urinary urgency and very rare incontinence, sometimes at night.    Sometime she has hesitancy and incompletely empties.     He has reduced vision, OS worse than OD  Changes have seemed gradual.   He denies vertigo.  He has some fatigue but tries to stay active.    He sleeps ok many nights but he has some dysesthesias and pain.      He denies mood or cognitive issues.  He has CAD and has had MI x 2.  He has had CABG twice.   He has HTN and elevated cholesterol and mild Type 2 NIDDM (not on any medication).  He also has had elevated CRT in 2023 but not 2022. , He denies heart failure.       MS History: He ws diagnosed with MS in 1999 after presenting with gait disturbance.   He had imaging studies c/w MS but reports he did not see a neurologist.unitl a few years later (Dr. Metta Clines).  He was initially on Betaseron.  He felt poorly on it and also had an exacerbation with poor balance.  Therefore, he switched to Tecfidera around 2012/2013.   He started seeing Dr. Ermalene Postin after Dr. Metta Clines retired.    Due to some progression, he was switched to [place] and 2020 - last infusion was late 2022, reportedly due to insurance issues.   He feels he has not had a clinical exacerbation x years but has had some progression of gait and vision issues.     Imaging: I personally reviewed MRI of the head 11/11/2016.  It shows multiple T2/FLAIR hyperintense foci in the periventricular white matter with some foci in the deep white matter.  None of them enhanced.  MRI of the cervical spine 11/11/2016 showed hyperintense foci posteriorly at C2-C3, anteriorly at C5, laterally to the left at C6.  MRI of the thoracic spine showed some foci in the mid thoracic spine.  None of the foci enhanced after contrast  MRI lumbar reports show "Small central focal disc protrusion at L5-S1 may impinge on the traversing S1 nerve roots.  Minimal multifactorial spinal and foraminal stenosis at L3-4 and L4-5   LABS: He was JCV Ab positive and anti-NMO negative and Hep B core IgG/IgM negative 08/03/2017  REVIEW OF SYSTEMS: Constitutional: No fevers, chills, sweats, or change in appetite Eyes: No visual changes, double vision, eye pain Ear, nose and throat: No hearing loss, ear pain, nasal congestion, sore throat Cardiovascular: No chest pain, palpitations.  He has CAD/MI and history of CABG Respiratory:  No shortness of breath at rest or with exertion.   No wheezes GastrointestinaI: No nausea, vomiting, diarrhea, abdominal pain, fecal incontinence Genitourinary:  No dysuria, urinary retention or frequency.  No nocturia. Musculoskeletal: He reports back pain and sometimes leg pain. Integumentary: No rash, pruritus, skin lesions Neurological: as above Psychiatric: No depression at this time.  No anxiety Endocrine: No palpitations, diaphoresis, change in appetite, change in weigh or increased thirst Hematologic/Lymphatic:  No anemia, purpura, petechiae. Allergic/Immunologic: No itchy/runny eyes, nasal congestion, recent allergic  reactions, rashes  ALLERGIES: Allergies  Allergen Reactions   Lodine [Etodolac] Nausea Only    HOME MEDICATIONS:  Current Outpatient Medications:    Albuterol Sulfate (VENTOLIN HFA IN), Inhale 1 puff into the lungs daily as needed (for wheezing). , Disp: , Rfl:    alendronate (FOSAMAX) 70 MG tablet, Take 70 mg by mouth once a week. Take with a full glass of water on an empty stomach., Disp: , Rfl:    Aspirin-Salicylamide-Caffeine (BC HEADACHE POWDER PO), Take by mouth as needed., Disp: , Rfl:    Cladribine, 7 Tabs, (MAVENCLAD, 7 TABS,) 10 MG TBPK, Take by mouth. Month 1: '20mg'$  on day 1-2. '10mg'$  on day 3-5. Month 2: Same as month one., Disp: , Rfl:    clopidogrel (PLAVIX) 75 MG tablet, Take 1 tablet (75 mg total) by mouth daily., Disp: 90 tablet, Rfl: 3   ezetimibe (ZETIA) 10 MG tablet, Take 10 mg by mouth daily., Disp: , Rfl:    fenofibrate micronized (LOFIBRA) 134 MG capsule, Take 134 mg by mouth daily., Disp: , Rfl:    losartan (COZAAR) 25 MG tablet, TAKE 1 TABLET BY MOUTH DAILY, Disp: 90 tablet, Rfl: 3   nitroGLYCERIN (NITROSTAT) 0.4 MG SL tablet, Place 0.4 mg under the tongue every 5 (five) minutes as needed for chest pain., Disp: , Rfl:    ocrelizumab 300 mg in sodium chloride 0.9 % 250 mL, Inject 300 mg into the vein every 6 (six) months., Disp: , Rfl:    pantoprazole (PROTONIX) 40 MG tablet, Take 1 tablet (40 mg total) by mouth daily., Disp: 30 tablet, Rfl: 2   pregabalin (LYRICA) 150 MG capsule, Take 1 capsule (150 mg total) by mouth at bedtime., Disp: 30 capsule, Rfl: 5   rosuvastatin (CRESTOR) 20 MG tablet, Take 1 tablet (20 mg total) by mouth daily., Disp: 90 tablet, Rfl: 3   spironolactone (ALDACTONE) 25 MG tablet, Take 0.5 tablets (12.5 mg total) by mouth daily., Disp: 45 tablet, Rfl: 3   VITAMIN D PO, Take 50,000 Units by mouth once a week., Disp: , Rfl:   PAST MEDICAL HISTORY: Past Medical History:  Diagnosis Date   Asthma    CAD (coronary artery disease)    s/p Inf STEMI  >> S/p CABG in 2003 // Myoview 11/2018: low risk // s/p NSTEMI 8/21 >> s/p redo CABG   Ischemic cardiomyopathy    Echo 8/21: EF 40-45, anterior/anterolateral HK, moderate asymmetric LVH, GR 1 DD, normal RVSF, RVSP 23.2   Mixed hyperlipidemia    Intol of statins (?elevated LFTs)   MRSA (methicillin resistant Staphylococcus aureus)    Multiple sclerosis (HCC)    Seasonal allergies     PAST SURGICAL HISTORY: Past Surgical History:  Procedure Laterality Date   CORONARY ARTERY BYPASS GRAFT N/A 06/18/2020   Procedure: REDO CORONARY ARTERY BYPASS GRAFTING (CABG), ON PUMP, TIMES THREE, USING RIGHT INTERNAL MAMMARY ARTERY AND ENDOSCOPICALLY HARVESTED LEFT GREATER SAPHENOUS VEIN;  Surgeon:  Grace Isaac, MD;  Location: [facility];  Service: Open Heart Surgery;  Laterality: N/A;  SEQENTIAL DIAG TO OM RIMA TO PREVIOUS DISTAL RIGHT VEIN GRAFT   ENDOVEIN HARVEST OF GREATER SAPHENOUS VEIN Left 06/18/2020   Procedure: ENDOVEIN HARVEST OF GREATER SAPHENOUS VEIN;  Surgeon: Grace Isaac, MD;  Location: [facility];  Service: Open Heart Surgery;  Laterality: Left;   LEFT HEART CATH AND CORS/GRAFTS ANGIOGRAPHY N/A 06/16/2020   Procedure: LEFT HEART CATH AND CORS/GRAFTS ANGIOGRAPHY;  Surgeon: Belva Crome, MD;  Location: [facility];  Service: Cardiovascular;  Laterality: N/A;   MANDIBLE SURGERY     TEE WITHOUT CARDIOVERSION N/A 06/18/2020   Procedure: TRANSESOPHAGEAL ECHOCARDIOGRAM (TEE);  Surgeon: Grace Isaac, MD;  Location: [facility];  Service: Open Heart Surgery;  Laterality: N/A;    FAMILY HISTORY: Family History  Problem Relation Age of Onset   Heart Problems Mother    Heart Problems Father    Heart attack Father    Heart Problems Sister    Heart Problems Brother     SOCIAL HISTORY: Social History   Socioeconomic History   Marital status: Married    Spouse name: Not on file   Number of children: Not on file   Years of education: Not on file   Highest education level: Not on file   Occupational History   Not on file  Tobacco Use   Smoking status: Never   Smokeless tobacco: Never  Substance and Sexual Activity   Alcohol use: Yes    Comment: Beer- socially   Drug use: No   Sexual activity: Not on file  Other Topics Concern   Not on file  Social History Narrative   Right handed   Caffeine use: Diet coke daily   Lives with dogs   Social Determinants of Health   Financial Resource Strain: Not on file  Food Insecurity: Not on file  Transportation Needs: Not on file  Physical Activity: Not on file  Stress: Not on file  Social Connections: Not on file  Intimate Partner Violence: Not on file       PHYSICAL EXAM  Vitals:   12/27/22 1252  BP: 125/68  Pulse: 66  Weight: 160 lb 3.2 oz (72.7 kg)  Height: 5' 10.5" (1.791 m)    Body mass index is 22.66 kg/m.  No results found.    General: The patient is well-developed and well-nourished and in no acute distress  HEENT:  Head is [place]/AT.  Sclera are anicteric.    Skin: Extremities are without rash or  edema.  Neurologic Exam  Mental status: The patient is alert and oriented x 3 at the time of the examination. The patient has apparent normal recent and remote memory, with an apparently normal attention span and concentration ability.   Speech is normal.  Cranial nerves: Extraocular movements are fFacial strnegth and sensation are normal.  . No obvious hearing deficits are noted.  Motor:  Muscle bulk is normal.   Tone is normal. Strength is  5 / 5 in all 4 extremities except 4+/5 iliopsoas and 5-5 left quads .   Sensory: Sensory testing is intact to pinprick, soft touch and vibration sensation in the arms and reduced vibration sensation in the left leg.  Intact touch sensation in the legs.  Coordination: Cerebellar testing reveals good finger-nose-finger and heel-to-shin bilaterally.  Gait and station: Station is normal.   Gait shows very slight left leg drop. Tandem gait is mildly wide. Romberg  is negative.   Reflexes: Deep tendon reflexes are symmetric and normal in the arms and increased at the knees, 3+ with crossed abductors, L>R.  Ankles are 3 ., L>R, with no clonus.        DIAGNOSTIC DATA (LABS, IMAGING, TESTING) - I reviewed patient records, labs, notes, testing and imaging myself where available.  Lab Results  Component Value Date   WBC 7.3 09/16/2022   HGB 14.5 09/16/2022   HCT 43.9 09/16/2022   MCV 93 09/16/2022   PLT 225 09/16/2022      Component Value Date/Time   NA 141 09/16/2022 1017   K 4.2 09/16/2022 1017   CL 102 09/16/2022 1017   CO2 24 09/16/2022 1017   GLUCOSE 122 (H) 09/16/2022 1017   GLUCOSE 114 (H) 06/23/2020 0219   BUN 15 09/16/2022 1017   CREATININE 0.91 09/16/2022 1017   CALCIUM 9.6 09/16/2022 1017   PROT 7.3 09/16/2022 1017   ALBUMIN 5.0 (H) 09/16/2022 1017   AST 34 09/16/2022 1017   ALT 31 09/16/2022 1017   ALKPHOS 49 09/16/2022 1017   BILITOT 0.6 09/16/2022 1017   GFRNONAA >60 06/23/2020 0219   GFRAA >60 06/23/2020 0219   Lab Results  Component Value Date   CHOL 140 03/12/2022   HDL 47 03/12/2022   LDLCALC 75 03/12/2022   TRIG 93 03/12/2022   CHOLHDL 3.0 03/12/2022   Lab Results  Component Value Date   HGBA1C 6.5 (H) 06/18/2020   No results found for: "VITAMINB12"     ASSESSMENT AND PLAN  Multiple sclerosis ([place]) - Plan: CBC with Differential/Platelet, Hepatic function panel  Dysesthesia  High risk medication use - Plan: CBC with Differential/Platelet, Hepatic function panel  Gait disturbance    Continue Mavenclad.  Check CBC/differential and liver function today.  If the lymphocyte count is 0.2 or lower consider adding Valtrex Stay active and exercise as tolerated Return in 4 months.  At that time we will need to check CBC with differential and liver function test again.   Tanajah Boulter A. Felecia Shelling, MD, [facility] [id number], [id number] PM Certified in Neurology, Clinical Neurophysiology, Sleep Medicine and  Neuroimaging  [facility] [address] [place], [place] [address] [phone]

## 2022-12-28 LAB — CBC WITH DIFFERENTIAL/PLATELET
Basophils Absolute: 0.1 10*3/uL (ref 0.0–0.2)
Basos: 1 %
EOS (ABSOLUTE): 0.3 10*3/uL (ref 0.0–0.4)
Eos: 7 %
Hematocrit: 41.6 % (ref 37.5–51.0)
Hemoglobin: 14 g/dL (ref 13.0–17.7)
Immature Grans (Abs): 0 10*3/uL (ref 0.0–0.1)
Immature Granulocytes: 1 %
Lymphocytes Absolute: 0.5 10*3/uL — ABNORMAL LOW (ref 0.7–3.1)
Lymphs: 11 %
MCH: 31.9 pg (ref 26.6–33.0)
MCHC: 33.7 g/dL (ref 31.5–35.7)
MCV: 95 fL (ref 79–97)
Monocytes Absolute: 0.5 10*3/uL (ref 0.1–0.9)
Monocytes: 12 %
Neutrophils Absolute: 3.2 10*3/uL (ref 1.4–7.0)
Neutrophils: 68 %
Platelets: 189 10*3/uL (ref 150–450)
RBC: 4.39 x10E6/uL (ref 4.14–5.80)
RDW: 13.5 % (ref 11.6–15.4)
WBC: 4.6 10*3/uL (ref 3.4–10.8)

## 2022-12-28 LAB — HEPATIC FUNCTION PANEL
ALT: 34 IU/L (ref 0–44)
AST: 37 IU/L (ref 0–40)
Albumin: 4.9 g/dL (ref 3.9–4.9)
Alkaline Phosphatase: 53 IU/L (ref 44–121)
Bilirubin Total: 0.7 mg/dL (ref 0.0–1.2)
Bilirubin, Direct: 0.2 mg/dL (ref 0.00–0.40)
Total Protein: 6.9 g/dL (ref 6.0–8.5)

## 2023-01-13 ENCOUNTER — Other Ambulatory Visit: Payer: Self-pay | Admitting: Nurse Practitioner

## 2023-01-13 DIAGNOSIS — E782 Mixed hyperlipidemia: Secondary | ICD-10-CM

## 2023-01-13 NOTE — Telephone Encounter (Signed)
Pt's medication was sent to pt's pharmacy as requested. Confirmation received.  °

## 2023-03-09 ENCOUNTER — Other Ambulatory Visit: Payer: Self-pay | Admitting: Cardiovascular Disease

## 2023-03-09 DIAGNOSIS — E782 Mixed hyperlipidemia: Secondary | ICD-10-CM

## 2023-03-19 ENCOUNTER — Other Ambulatory Visit: Payer: Self-pay | Admitting: Cardiovascular Disease

## 2023-04-14 ENCOUNTER — Other Ambulatory Visit: Payer: Self-pay | Admitting: Cardiovascular Disease

## 2023-04-14 DIAGNOSIS — E782 Mixed hyperlipidemia: Secondary | ICD-10-CM

## 2023-05-03 ENCOUNTER — Other Ambulatory Visit: Payer: Self-pay | Admitting: Cardiovascular Disease

## 2023-05-03 DIAGNOSIS — E782 Mixed hyperlipidemia: Secondary | ICD-10-CM

## 2023-05-07 ENCOUNTER — Other Ambulatory Visit: Payer: Self-pay | Admitting: Cardiovascular Disease

## 2023-05-07 DIAGNOSIS — E782 Mixed hyperlipidemia: Secondary | ICD-10-CM

## 2023-05-24 ENCOUNTER — Other Ambulatory Visit: Payer: Self-pay | Admitting: Cardiovascular Disease

## 2023-05-24 DIAGNOSIS — E782 Mixed hyperlipidemia: Secondary | ICD-10-CM

## 2023-05-25 ENCOUNTER — Encounter: Payer: Self-pay | Admitting: Neurology

## 2023-05-25 ENCOUNTER — Telehealth: Payer: Self-pay | Admitting: Neurology

## 2023-05-25 ENCOUNTER — Ambulatory Visit: Payer: Commercial Managed Care - HMO | Admitting: Neurology

## 2023-05-25 VITALS — BP 98/87 | HR 44 | Ht 70.0 in | Wt 153.0 lb

## 2023-05-25 DIAGNOSIS — G35 Multiple sclerosis: Secondary | ICD-10-CM | POA: Diagnosis not present

## 2023-05-25 DIAGNOSIS — R200 Anesthesia of skin: Secondary | ICD-10-CM | POA: Diagnosis not present

## 2023-05-25 DIAGNOSIS — R269 Unspecified abnormalities of gait and mobility: Secondary | ICD-10-CM

## 2023-05-25 DIAGNOSIS — Z79899 Other long term (current) drug therapy: Secondary | ICD-10-CM

## 2023-05-25 DIAGNOSIS — R208 Other disturbances of skin sensation: Secondary | ICD-10-CM

## 2023-05-25 NOTE — Telephone Encounter (Signed)
sent to [facility] they obtain [employer] auth [phone] 

## 2023-05-25 NOTE — Progress Notes (Signed)
[facility]  PATIENT: Gregory Gardner DOB: 1961-04-22  REFERRING DOCTOR OR PCP:  Charmayne Sheer, MD; Caffie Damme, MD SOURCE: Patient, notes from [facility], imaging and lab reports, MRI images personally reviewed.  _________________________________   HISTORICAL  CHIEF COMPLAINT:  Chief Complaint  Patient presents with   Follow-up    Rm 10. Alone. Patient is doing well overall. He is having numbness in palm, back, leg, and across stomach. He does report some visual changes.    HISTORY OF PRESENT ILLNESS:   Gregory Gardner is a 62 y.o. man with multiple sclerosis  UPDATE 05/25/2023 He did the two weeks of Mavenclad December 2023 and January 2024.   He felt poorly the second week but no rash/itchong.  He denies any exacerbations.  His gait is stable and he can walk a couple miles.   He does not need the rail to go downstairs..   His left leg is mildly weak and numb with mild spasticity at times.  He also reports LBP.  His arms are strong and no numbness.     He has newer numbness under the left shoulder blade and in he right arm into the 4th and fifth fingers.  The right arm numbness can sometimes be painful and bothers him at night at times.  He has urinary urgency and very rare incontinence, sometimes at night.    Sometime she has hesitancy and incompletely empties.     He has reduced vision, OS worse than OD  Changes have seemed gradual.   He denies vertigo.  He has some fatigue but tries to stay active.    He sleeps ok many nights but he has some dysesthesias and pain.      He denies mood or cognitive issues.  He has CAD and has had MI x 2.  He has had CABG twice.   He has HTN and elevated cholesterol and mild Type 2 NIDDM (not on any medication).  He also has had elevated CRT in 2023 but not 2022. , He denies heart failure.       MS History: He ws diagnosed with MS in 1999 after presenting with gait disturbance.   He had imaging studies c/w MS but  reports he did not see a neurologist.unitl a few years later (Dr. Adella Hare).  He was initially on Betaseron.  He felt poorly on it and also had an exacerbation with poor balance.  Therefore, he switched to Tecfidera around 2012/2013.   He started seeing Dr. Maple Hudson after Dr. Adella Hare retired.   Due to some progression, he was switched to Ocrevus and 2020 - last infusion was late 2022, reportedly due to insurance issues.   He feels he has not had a clinical exacerbation x years but has had some progression of gait and vision issues.     Imaging: I personally reviewed MRI of the head 11/11/2016.  It shows multiple T2/FLAIR hyperintense foci in the periventricular white matter with some foci in the deep white matter.  None of them enhanced.  MRI of the cervical spine 11/11/2016 showed hyperintense foci posteriorly at C2-C3, anteriorly at C5, laterally to the left at C6.  MRI of the thoracic spine showed some foci in the mid thoracic spine.  None of the foci enhanced after contrast  MRI lumbar reports show "Small central focal disc protrusion at L5-S1 may impinge on the traversing S1 nerve roots.  Minimal multifactorial spinal and foraminal stenosis at L3-4 and L4-5   LABS: He  was JCV Ab positive and anti-NMO negative and Hep B core IgG/IgM negative 08/03/2017  REVIEW OF SYSTEMS: Constitutional: No fevers, chills, sweats, or change in appetite Eyes: No visual changes, double vision, eye pain Ear, nose and throat: No hearing loss, ear pain, nasal congestion, sore throat Cardiovascular: No chest pain, palpitations.  He has CAD/MI and history of CABG Respiratory:  No shortness of breath at rest or with exertion.   No wheezes GastrointestinaI: No nausea, vomiting, diarrhea, abdominal pain, fecal incontinence Genitourinary:  No dysuria, urinary retention or frequency.  No nocturia. Musculoskeletal: He reports back pain and sometimes leg pain. Integumentary: No rash, pruritus, skin lesions Neurological: as  above Psychiatric: No depression at this time.  No anxiety Endocrine: No palpitations, diaphoresis, change in appetite, change in weigh or increased thirst Hematologic/Lymphatic:  No anemia, purpura, petechiae. Allergic/Immunologic: No itchy/runny eyes, nasal congestion, recent allergic reactions, rashes  ALLERGIES: Allergies  Allergen Reactions   Lodine [Etodolac] Nausea Only    HOME MEDICATIONS:  Current Outpatient Medications:    Albuterol Sulfate (VENTOLIN HFA IN), Inhale 1 puff into the lungs daily as needed (for wheezing). , Disp: , Rfl:    alendronate (FOSAMAX) 70 MG tablet, Take 70 mg by mouth once a week. Take with a full glass of water on an empty stomach., Disp: , Rfl:    Aspirin-Salicylamide-Caffeine (BC HEADACHE POWDER PO), Take by mouth as needed., Disp: , Rfl:    Cladribine, 7 Tabs, (MAVENCLAD, 7 TABS,) 10 MG TBPK, Take by mouth. Month 1: 20mg  on day 1-2. 10mg  on day 3-5. Month 2: Same as month one., Disp: , Rfl:    clopidogrel (PLAVIX) 75 MG tablet, Take 1 tablet (75 mg total) by mouth daily., Disp: 90 tablet, Rfl: 3   ezetimibe (ZETIA) 10 MG tablet, Take 10 mg by mouth daily., Disp: , Rfl:    fenofibrate micronized (LOFIBRA) 134 MG capsule, Take 134 mg by mouth daily., Disp: , Rfl:    losartan (COZAAR) 25 MG tablet, TAKE 1 TABLET BY MOUTH DAILY, Disp: 90 tablet, Rfl: 3   pantoprazole (PROTONIX) 40 MG tablet, Take 1 tablet (40 mg total) by mouth daily., Disp: 30 tablet, Rfl: 2   pregabalin (LYRICA) 150 MG capsule, Take 1 capsule (150 mg total) by mouth at bedtime., Disp: 30 capsule, Rfl: 5   rosuvastatin (CRESTOR) 20 MG tablet, TAKE 1 TABLET BY MOUTH DAILY., Disp: 15 tablet, Rfl: 0   spironolactone (ALDACTONE) 25 MG tablet, TAKE 1/2 TABLET BY MOUTH DAILY., Disp: 15 tablet, Rfl: 0   VITAMIN D PO, Take 1,000 Units by mouth daily., Disp: , Rfl:    nitroGLYCERIN (NITROSTAT) 0.4 MG SL tablet, Place 0.4 mg under the tongue every 5 (five) minutes as needed for chest pain.  (Patient not taking: Reported on 05/25/2023), Disp: , Rfl:    ocrelizumab 300 mg in sodium chloride 0.9 % 250 mL, Inject 300 mg into the vein every 6 (six) months. (Patient not taking: Reported on 05/25/2023), Disp: , Rfl:   PAST MEDICAL HISTORY: Past Medical History:  Diagnosis Date   Asthma    CAD (coronary artery disease)    s/p Inf STEMI >> S/p CABG in 2003 // Myoview 11/2018: low risk // s/p NSTEMI 8/21 >> s/p redo CABG   Ischemic cardiomyopathy    Echo 8/21: EF 40-45, anterior/anterolateral HK, moderate asymmetric LVH, GR 1 DD, normal RVSF, RVSP 23.2   Mixed hyperlipidemia    Intol of statins (?elevated LFTs)   MRSA (methicillin resistant Staphylococcus aureus)  Multiple sclerosis (HCC)    Seasonal allergies     PAST SURGICAL HISTORY: Past Surgical History:  Procedure Laterality Date   CORONARY ARTERY BYPASS GRAFT N/A 06/18/2020   Procedure: REDO CORONARY ARTERY BYPASS GRAFTING (CABG), ON PUMP, TIMES THREE, USING RIGHT INTERNAL MAMMARY ARTERY AND ENDOSCOPICALLY HARVESTED LEFT GREATER SAPHENOUS VEIN;  Surgeon: Delight Ovens, MD;  Location: [facility] OR;  Service: Open Heart Surgery;  Laterality: N/A;  SEQENTIAL DIAG TO OM RIMA TO PREVIOUS DISTAL RIGHT VEIN GRAFT   ENDOVEIN HARVEST OF GREATER SAPHENOUS VEIN Left 06/18/2020   Procedure: ENDOVEIN HARVEST OF GREATER SAPHENOUS VEIN;  Surgeon: Delight Ovens, MD;  [facility];  Service: Open Heart Surgery;  Laterality: Left;   LEFT HEART CATH AND CORS/GRAFTS ANGIOGRAPHY N/A 06/16/2020   Procedure: LEFT HEART CATH AND CORS/GRAFTS ANGIOGRAPHY;  Surgeon: Lyn Records, MD;  Location: [facility] INVASIVE CV LAB;  Service: Cardiovascular;  Laterality: N/A;   MANDIBLE SURGERY     TEE WITHOUT CARDIOVERSION N/A 06/18/2020   Procedure: TRANSESOPHAGEAL ECHOCARDIOGRAM (TEE);  Surgeon: Delight Ovens, MD;  Location: [facility] OR;  Service: Open Heart Surgery;  Laterality: N/A;    FAMILY HISTORY: Family History  Problem Relation Age of Onset   Heart  Problems Mother    Heart Problems Father    Heart attack Father    Heart Problems Sister    Heart Problems Brother     SOCIAL HISTORY: Social History   Socioeconomic History   Marital status: Married    Spouse name: Not on file   Number of children: Not on file   Years of education: Not on file   Highest education level: Not on file  Occupational History   Not on file  Tobacco Use   Smoking status: Never   Smokeless tobacco: Never  Substance and Sexual Activity   Alcohol use: Yes    Comment: Beer- socially   Drug use: No   Sexual activity: Not on file  Other Topics Concern   Not on file  Social History Narrative   Right handed   Caffeine use: Diet coke daily   Lives with dogs   Social Determinants of Health   Financial Resource Strain: Not on file  Food Insecurity: Not on file  Transportation Needs: Not on file  Physical Activity: Not on file  Stress: Not on file  Social Connections: Unknown (03/14/2022)   Received from [facility]   Social Network    Social Network: Not on file  Intimate Partner Violence: Unknown (02/04/2022)   Received from [facility]   HITS    Physically Hurt: Not on file    Insult or Talk Down To: Not on file    Threaten Physical Harm: Not on file    Scream or Curse: Not on file       PHYSICAL EXAM  Vitals:   05/25/23 1356  BP: 98/87  Pulse: (!) 44  SpO2: 96%  Weight: 153 lb (69.4 kg)  Height: 5\' 10"  (1.778 m)    Body mass index is 21.95 kg/m.  No results found.    General: The patient is well-developed and well-nourished and in no acute distress  HEENT:  Head is [place]/AT.  Sclera are anicteric.    Skin: Extremities are without rash or  edema.  Neurologic Exam  Mental status: The patient is alert and oriented x 3 at the time of the examination. The patient has apparent normal recent and remote memory, with an apparently normal attention span and concentration  ability.   Speech is normal.  Cranial nerves:  Extraocular movements are fFacial strnegth and sensation are normal.  . No obvious hearing deficits are noted.  Motor:  Muscle bulk is normal.   Tone is normal. Strength is  5 / 5 in the arms except for 4+/5 strength in the ulnar innervated hand muscles.  Legs were 5/5 except 4+/5 iliopsoas and 5-5 left quads .   Sensory: Sensory testing is intact to pinprick, soft touch and vibration sensation in the arms and reduced vibration sensation in the left leg.  Intact touch sensation in the legs.  He has Tinel signs at the elbows, right much more so than left with no Tinel signs at the wrist.  Coordination: Cerebellar testing reveals good finger-nose-finger and heel-to-shin bilaterally.  Gait and station: Station is normal.   Gait shows very slight left leg drop. Tandem gait is mildly wide. Romberg is negative.   Reflexes: Deep tendon reflexes are symmetric and normal in the arms and increased at the knees, 3+ with crossed abductors, L>R.  Ankles are 3 ., L>R, with no clonus.        DIAGNOSTIC DATA (LABS, IMAGING, TESTING) - I reviewed patient records, labs, notes, testing and imaging myself where available.  Lab Results  Component Value Date   WBC 4.6 12/27/2022   HGB 14.0 12/27/2022   HCT 41.6 12/27/2022   MCV 95 12/27/2022   PLT 189 12/27/2022      Component Value Date/Time   NA 141 09/16/2022 1017   K 4.2 09/16/2022 1017   CL 102 09/16/2022 1017   CO2 24 09/16/2022 1017   GLUCOSE 122 (H) 09/16/2022 1017   GLUCOSE 114 (H) 06/23/2020 0219   BUN 15 09/16/2022 1017   CREATININE 0.91 09/16/2022 1017   CALCIUM 9.6 09/16/2022 1017   PROT 6.9 12/27/2022 1323   ALBUMIN 4.9 12/27/2022 1323   AST 37 12/27/2022 1323   ALT 34 12/27/2022 1323   ALKPHOS 53 12/27/2022 1323   BILITOT 0.7 12/27/2022 1323   GFRNONAA >60 06/23/2020 0219   GFRAA >60 06/23/2020 0219   Lab Results  Component Value Date   CHOL 140 03/12/2022   HDL 47 03/12/2022   LDLCALC 75 03/12/2022   TRIG 93 03/12/2022    CHOLHDL 3.0 03/12/2022   Lab Results  Component Value Date   HGBA1C 6.5 (H) 06/18/2020       ASSESSMENT AND PLAN  Multiple sclerosis (HCC) - Plan: MR BRAIN W WO CONTRAST, MR CERVICAL SPINE W WO CONTRAST, CBC with Differential/Platelet, Comprehensive metabolic panel  Numbness - Plan: MR BRAIN W WO CONTRAST, MR CERVICAL SPINE W WO CONTRAST  High risk medication use - Plan: CBC with Differential/Platelet, Comprehensive metabolic panel  Gait disturbance  Dysesthesia    Continue Mavenclad.  Check CBC/differential and liver function today.  If the lymphocyte count is 0.2 or lower consider adding Valtrex.  Will check MRI of the brain and cervical spine as he has some new sensory symptoms consider a stronger disease modifying therapy if there is significant breakthrough activity. 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  We discussed that if symptoms worsen I would want to check a NCV/EMG and consider referral to orthopedics for surgery.  We discussed trying to avoid pressure near the ulnar groove.   return in 4-5 months.  At that time we will need to check labs including chronic infections.Pearletha Furl. Epimenio Foot, MD, [facility] Cap 05/25/2023, 4:11  PM Certified in Neurology, Clinical Neurophysiology, Sleep Medicine and Neuroimaging  [facility] [address], [address] [place], [place] [address] [phone]

## 2023-05-26 LAB — CBC WITH DIFFERENTIAL/PLATELET
Basophils Absolute: 0 10*3/uL (ref 0.0–0.2)
Basos: 1 %
EOS (ABSOLUTE): 0.2 10*3/uL (ref 0.0–0.4)
Eos: 4 %
Hematocrit: 40.5 % (ref 37.5–51.0)
Hemoglobin: 13.7 g/dL (ref 13.0–17.7)
Immature Grans (Abs): 0 10*3/uL (ref 0.0–0.1)
Immature Granulocytes: 0 %
Lymphocytes Absolute: 0.7 10*3/uL (ref 0.7–3.1)
Lymphs: 15 %
MCH: 31.4 pg (ref 26.6–33.0)
MCHC: 33.8 g/dL (ref 31.5–35.7)
MCV: 93 fL (ref 79–97)
Monocytes Absolute: 0.4 10*3/uL (ref 0.1–0.9)
Monocytes: 9 %
Neutrophils Absolute: 3.4 10*3/uL (ref 1.4–7.0)
Neutrophils: 71 %
Platelets: 163 10*3/uL (ref 150–450)
RBC: 4.36 x10E6/uL (ref 4.14–5.80)
RDW: 12.5 % (ref 11.6–15.4)
WBC: 4.8 10*3/uL (ref 3.4–10.8)

## 2023-05-26 LAB — COMPREHENSIVE METABOLIC PANEL
ALT: 28 IU/L (ref 0–44)
AST: 35 IU/L (ref 0–40)
Albumin: 4.7 g/dL (ref 3.9–4.9)
Alkaline Phosphatase: 44 IU/L (ref 44–121)
BUN/Creatinine Ratio: 14 (ref 10–24)
BUN: 16 mg/dL (ref 8–27)
Bilirubin Total: 0.5 mg/dL (ref 0.0–1.2)
CO2: 23 mmol/L (ref 20–29)
Calcium: 9.8 mg/dL (ref 8.6–10.2)
Chloride: 103 mmol/L (ref 96–106)
Creatinine, Ser: 1.12 mg/dL (ref 0.76–1.27)
Globulin, Total: 2.2 g/dL (ref 1.5–4.5)
Glucose: 91 mg/dL (ref 70–99)
Sodium: 140 mmol/L (ref 134–144)
Total Protein: 6.9 g/dL (ref 6.0–8.5)
eGFR: 74 mL/min/{1.73_m2} (ref >59)

## 2023-05-27 ENCOUNTER — Other Ambulatory Visit: Payer: Self-pay | Admitting: Neurology

## 2023-05-27 DIAGNOSIS — R200 Anesthesia of skin: Secondary | ICD-10-CM

## 2023-05-27 DIAGNOSIS — T1590XA Foreign body on external eye, part unspecified, unspecified eye, initial encounter: Secondary | ICD-10-CM

## 2023-05-27 DIAGNOSIS — G35 Multiple sclerosis: Secondary | ICD-10-CM

## 2023-06-20 ENCOUNTER — Other Ambulatory Visit: Payer: Self-pay | Admitting: Neurology

## 2023-06-20 NOTE — Telephone Encounter (Signed)
Last appointment: 05/25/2023 Next appointment: 11/16/2023

## 2023-07-07 ENCOUNTER — Encounter: Payer: Self-pay | Admitting: Neurology

## 2023-07-11 ENCOUNTER — Ambulatory Visit
Admission: RE | Admit: 2023-07-11 | Discharge: 2023-07-11 | Disposition: A | Discharge status: Home / Self Care | Payer: Commercial Managed Care - HMO | Source: Ambulatory Visit | Attending: Neurology | Admitting: Neurology

## 2023-07-11 DIAGNOSIS — G35 Multiple sclerosis: Secondary | ICD-10-CM

## 2023-07-11 DIAGNOSIS — R200 Anesthesia of skin: Secondary | ICD-10-CM

## 2023-07-11 DIAGNOSIS — T1590XA Foreign body on external eye, part unspecified, unspecified eye, initial encounter: Secondary | ICD-10-CM

## 2023-07-11 MED ORDER — GADOPICLENOL 0.5 MMOL/ML IV SOLN
7.0000 mL | Freq: Once | INTRAVENOUS | Status: AC | PRN
  Administered 2023-07-11: 7 mL via INTRAVENOUS

## 2023-07-28 ENCOUNTER — Telehealth: Payer: Self-pay | Admitting: Neurology

## 2023-07-28 NOTE — Telephone Encounter (Signed)
Phone room: please call pt and let them know Dr. Epimenio Foot wants them seen sooner for follow up in November. Please offer 09/05/23 at 1:30pm with Dr. Epimenio Foot and cancel 11/16/23 if they accept

## 2023-07-28 NOTE — Telephone Encounter (Signed)
Addie states the window opens 09-09-23 and wants to know if re: Olin Pia will pt take 2nd year

## 2023-07-28 NOTE — Telephone Encounter (Signed)
Dr. Epimenio Foot- he was last seen 05/25/23. Next f/u 11/16/23.   Completed year 1 of Mavenclad 10/2022 and 11/2022  Did you want him to have sooner follow up?

## 2023-07-28 NOTE — Telephone Encounter (Signed)
Phone rep called pt, left brief vm asking pt to call so can be assisted in r/s appointment from 01.15.25 to 11.04.24

## 2023-07-29 NOTE — Telephone Encounter (Signed)
Pt returned call and was r/s. Pt verbalized appreciation.

## 2023-08-01 NOTE — Telephone Encounter (Signed)
Noted  

## 2023-09-05 ENCOUNTER — Ambulatory Visit: Payer: Managed Care, Other (non HMO) | Admitting: Neurology

## 2023-09-05 ENCOUNTER — Encounter: Payer: Self-pay | Admitting: Neurology

## 2023-09-05 VITALS — BP 99/57 | HR 50 | Ht 71.0 in | Wt 151.5 lb

## 2023-09-05 DIAGNOSIS — G35 Multiple sclerosis: Secondary | ICD-10-CM | POA: Diagnosis not present

## 2023-09-05 DIAGNOSIS — Z79899 Other long term (current) drug therapy: Secondary | ICD-10-CM

## 2023-09-05 DIAGNOSIS — E782 Mixed hyperlipidemia: Secondary | ICD-10-CM

## 2023-09-05 DIAGNOSIS — R208 Other disturbances of skin sensation: Secondary | ICD-10-CM

## 2023-09-05 DIAGNOSIS — R200 Anesthesia of skin: Secondary | ICD-10-CM | POA: Diagnosis not present

## 2023-09-05 DIAGNOSIS — R269 Unspecified abnormalities of gait and mobility: Secondary | ICD-10-CM

## 2023-09-05 NOTE — Progress Notes (Signed)
[facility]  PATIENT: Gregory Gardner DOB: 02-Dec-1960  REFERRING DOCTOR OR PCP:  Charmayne Sheer, MD; Caffie Damme, MD SOURCE: Patient, notes from [facility], imaging and lab reports, MRI images personally reviewed.  _________________________________   HISTORICAL  CHIEF COMPLAINT:  Chief Complaint  Patient presents with   Room 11    Pt is here Alone. Pt states that his back is hurting. Pt states that his left leg goes numb and stays numb 90% of the time. Pt states his right hand will get numb.     HISTORY OF PRESENT ILLNESS:   Gregory Gardner is a 62 y.o. man with multiple sclerosis  UPDATE 09/05/2023 He did the two weeks of Mavenclad December 2023 and January 2024.   He felt poorly the second week but no rash/itchong.  He denies any exacerbations.  His gait is stable and he can walk a couple miles.   He does not need the rail to go downstairs..   His left leg is a little weaker and he notes it falls asleep.    He also reports LBP.   He has mild spasticity at times.  His arms are strong and no numbness.     He has newer numbness under the left shoulder blade and in he right arm into the 4th and fifth fingers.  The right arm numbness can sometimes be painful and bothers him at night at times.  He has urinary urgency and very rare incontinence, sometimes at night.    Sometime she has hesitancy and incompletely empties.     He has reduced vision, OS worse than OD  Changes have seemed gradual.   He denies vertigo.  He has some fatigue but tries to stay active.    He sleeps ok many nights but he has some dysesthesias and pain.      He denies mood or cognitive issues.  He has CAD and has had MI x 2.  He has had CABG twice.   He has HTN and elevated cholesterol and mild Type 2 NIDDM (not on any medication).  He also has had elevated CRT in 2023 but not 2022. , He denies heart failure.       MS History: He ws diagnosed with MS in 1999 after presenting with gait  disturbance.   He had imaging studies c/w MS but reports he did not see a neurologist.unitl a few years later (Dr. Adella Hare).  He was initially on Betaseron.  He felt poorly on it and also had an exacerbation with poor balance.  Therefore, he switched to Tecfidera around 2012/2013.   He started seeing Dr. Maple Hudson after Dr. Adella Hare retired.   Due to some progression, he was switched to Ocrevus and 2020 - last infusion was late 2022, reportedly due to insurance issues.   He feels he has not had a clinical exacerbation x years but has had some progression of gait and vision issues.   He had year 1 Mavenclad Nov/Dec 2024  Imaging: MRI of the brain 07/11/2023 showed Scattered T2/FLAIR hyperintense foci in the periventricular and deep white matter of the cerebral hemispheres. None of the foci enhance or appear to be acute. Compared to the MRI from 11/11/2016, there were no new lesions. There are nonspecific, these are consistent with chronic demyelinating plaques associated with multiple sclerosis.   MRI cervical spine 07/11/2023 shwed T2 hyperintense foci within the spinal cord adjacent to C2-C3 and C5.  These were also noted on the previous MRI from  2018.  They do not enhance.    Minimal disc degenerative changes at C4-C5 and C5-C6 do not lead to spinal stenosis or nerve root compression.  MRI of the head 11/11/2016.  It shows multiple T2/FLAIR hyperintense foci in the periventricular white matter with some foci in the deep white matter.  None of them enhanced.  MRI of the cervical spine 11/11/2016 showed hyperintense foci posteriorly at C2-C3, anteriorly at C5, laterally to the left at C6.  MRI of the thoracic spine showed some foci in the mid thoracic spine.  None of the foci enhanced after contrast  MRI lumbar reports show "Small central focal disc protrusion at L5-S1 may impinge on the traversing S1 nerve roots.  Minimal multifactorial spinal and foraminal stenosis at L3-4 and L4-5   LABS: He was JCV Ab  positive and anti-NMO negative and Hep B core IgG/IgM negative 08/03/2017  REVIEW OF SYSTEMS: Constitutional: No fevers, chills, sweats, or change in appetite Eyes: No visual changes, double vision, eye pain Ear, nose and throat: No hearing loss, ear pain, nasal congestion, sore throat Cardiovascular: No chest pain, palpitations.  He has CAD/MI and history of CABG Respiratory:  No shortness of breath at rest or with exertion.   No wheezes GastrointestinaI: No nausea, vomiting, diarrhea, abdominal pain, fecal incontinence Genitourinary:  No dysuria, urinary retention or frequency.  No nocturia. Musculoskeletal: He reports back pain and sometimes leg pain. Integumentary: No rash, pruritus, skin lesions Neurological: as above Psychiatric: No depression at this time.  No anxiety Endocrine: No palpitations, diaphoresis, change in appetite, change in weigh or increased thirst Hematologic/Lymphatic:  No anemia, purpura, petechiae. Allergic/Immunologic: No itchy/runny eyes, nasal congestion, recent allergic reactions, rashes  ALLERGIES: Allergies  Allergen Reactions   Lodine [Etodolac] Nausea Only    HOME MEDICATIONS:  Current Outpatient Medications:    Albuterol Sulfate (VENTOLIN HFA IN), Inhale 1 puff into the lungs daily as needed (for wheezing). , Disp: , Rfl:    alendronate (FOSAMAX) 70 MG tablet, Take 70 mg by mouth once a week. Take with a full glass of water on an empty stomach., Disp: , Rfl:    Aspirin-Salicylamide-Caffeine (BC HEADACHE POWDER PO), Take by mouth as needed., Disp: , Rfl:    Cladribine, 7 Tabs, (MAVENCLAD, 7 TABS,) 10 MG TBPK, Take by mouth. Month 1: 20mg  on day 1-2. 10mg  on day 3-5. Month 2: Same as month one., Disp: , Rfl:    clopidogrel (PLAVIX) 75 MG tablet, Take 1 tablet (75 mg total) by mouth daily., Disp: 90 tablet, Rfl: 3   ezetimibe (ZETIA) 10 MG tablet, Take 10 mg by mouth daily., Disp: , Rfl:    fenofibrate micronized (LOFIBRA) 134 MG capsule, Take 134 mg by  mouth daily., Disp: , Rfl:    losartan (COZAAR) 25 MG tablet, TAKE 1 TABLET BY MOUTH DAILY, Disp: 90 tablet, Rfl: 3   pantoprazole (PROTONIX) 40 MG tablet, Take 1 tablet (40 mg total) by mouth daily., Disp: 30 tablet, Rfl: 2   pregabalin (LYRICA) 150 MG capsule, TAKE 1 CAPSULE BY MOUTH AT BEDTIME., Disp: 30 capsule, Rfl: 4   rosuvastatin (CRESTOR) 20 MG tablet, TAKE 1 TABLET BY MOUTH DAILY., Disp: 15 tablet, Rfl: 0   spironolactone (ALDACTONE) 25 MG tablet, TAKE 1/2 TABLET BY MOUTH DAILY., Disp: 15 tablet, Rfl: 0   nitroGLYCERIN (NITROSTAT) 0.4 MG SL tablet, Place 0.4 mg under the tongue every 5 (five) minutes as needed for chest pain. (Patient not taking: Reported on 05/25/2023), Disp: , Rfl:  ocrelizumab 300 mg in sodium chloride 0.9 % 250 mL, Inject 300 mg into the vein every 6 (six) months. (Patient not taking: Reported on 05/25/2023), Disp: , Rfl:    VITAMIN D PO, Take 1,000 Units by mouth daily. (Patient not taking: Reported on 09/05/2023), Disp: , Rfl:   PAST MEDICAL HISTORY: Past Medical History:  Diagnosis Date   Asthma    CAD (coronary artery disease)    s/p Inf STEMI >> S/p CABG in 2003 // Myoview 11/2018: low risk // s/p NSTEMI 8/21 >> s/p redo CABG   Ischemic cardiomyopathy    Echo 8/21: EF 40-45, anterior/anterolateral HK, moderate asymmetric LVH, GR 1 DD, normal RVSF, RVSP 23.2   Mixed hyperlipidemia    Intol of statins (?elevated LFTs)   MRSA (methicillin resistant Staphylococcus aureus)    Multiple sclerosis (HCC)    Seasonal allergies     PAST SURGICAL HISTORY: Past Surgical History:  Procedure Laterality Date   CORONARY ARTERY BYPASS GRAFT N/A 06/18/2020   Procedure: REDO CORONARY ARTERY BYPASS GRAFTING (CABG), ON PUMP, TIMES THREE, USING RIGHT INTERNAL MAMMARY ARTERY AND ENDOSCOPICALLY HARVESTED LEFT GREATER SAPHENOUS VEIN;  Surgeon: Delight Ovens, MD;  Location: [facility] OR;  Service: Open Heart Surgery;  Laterality: N/A;  SEQENTIAL DIAG TO OM RIMA TO PREVIOUS DISTAL  RIGHT VEIN GRAFT   ENDOVEIN HARVEST OF GREATER SAPHENOUS VEIN Left 06/18/2020   Procedure: ENDOVEIN HARVEST OF GREATER SAPHENOUS VEIN;  Surgeon: Delight Ovens, MD;  Location: [facility] OR;  Service: Open Heart Surgery;  Laterality: Left;   LEFT HEART CATH AND CORS/GRAFTS ANGIOGRAPHY N/A 06/16/2020   Procedure: LEFT HEART CATH AND CORS/GRAFTS ANGIOGRAPHY;  Surgeon: Lyn Records, MD;  Location: [facility] INVASIVE CV LAB;  Service: Cardiovascular;  Laterality: N/A;   MANDIBLE SURGERY     TEE WITHOUT CARDIOVERSION N/A 06/18/2020   Procedure: TRANSESOPHAGEAL ECHOCARDIOGRAM (TEE);  Surgeon: Delight Ovens, MD;  Location: [facility] OR;  Service: Open Heart Surgery;  Laterality: N/A;    FAMILY HISTORY: Family History  Problem Relation Age of Onset   Heart Problems Mother    Heart Problems Father    Heart attack Father    Heart Problems Sister    Heart Problems Brother     SOCIAL HISTORY: Social History   Socioeconomic History   Marital status: Married    Spouse name: Not on file   Number of children: Not on file   Years of education: Not on file   Highest education level: Not on file  Occupational History   Not on file  Tobacco Use   Smoking status: Never   Smokeless tobacco: Never  Substance and Sexual Activity   Alcohol use: Yes    Comment: Beer- socially   Drug use: No   Sexual activity: Not on file  Other Topics Concern   Not on file  Social History Narrative   Right handed   Caffeine use: Diet coke daily   Lives with dogs   Social Determinants of Health   Financial Resource Strain: Not on file  Food Insecurity: Not on file  Transportation Needs: Not on file  Physical Activity: Not on file  Stress: Not on file  Social Connections: Unknown (03/14/2022)   Received from [facility]   Social Network    Social Network: Not on file  Intimate Partner Violence: Unknown (02/04/2022)   Received from [facility], [facility]   HITS    Physically Hurt: Not on file     Insult or Talk  Down To: Not on file    Threaten Physical Harm: Not on file    Scream or Curse: Not on file       PHYSICAL EXAM  Vitals:   09/05/23 1318  BP: (!) 99/57  Pulse: (!) 50  Weight: 151 lb 8 oz (68.7 kg)  Height: 5\' 11"  (1.803 m)    Body mass index is 21.13 kg/m.  No results found.    General: The patient is well-developed and well-nourished and in no acute distress  HEENT:  Head is [place]/AT.  Sclera are anicteric.    Skin: Extremities are without rash or  edema.  Neurologic Exam  Mental status: The patient is alert and oriented x 3 at the time of the examination. The patient has apparent normal recent and remote memory, with an apparently normal attention span and concentration ability.   Speech is normal.  Cranial nerves: Extraocular movements are full.  Mild reduced acuity OS but colors are symmetric.  Facial strnegth and sensation are normal.  . No obvious hearing deficits are noted.  Motor:  Muscle bulk is normal.   Tone is normal. Strength is  5 / 5 in the arms except for 4+/5 strength in the ulnar innervated hand muscles.  Legs were 5/5 except left  4+/5 iliopsoas and TA/EHL and 5-/5 left quads .   Sensory: Sensory testing is intact to pinprick, soft touch and vibration sensation in the arms and reduced vibration sensation in the left leg.  Intact touch sensation in the legs.  He has Tinel signs at the right elbow.  No Tinel signs at the wrist.  Coordination: Cerebellar testing reveals good finger-nose-finger and heel-to-shin bilaterally.  Gait and station: Station is normal.   Gait shows very slight left leg drop. Tandem gait is mildly wide. Romberg is negative.   Reflexes: Deep tendon reflexes are symmetric and normal in the arms and increased at the knees, 3+ with crossed abductors, L>R.  Ankles are 2 ., L>R, with no clonus.        DIAGNOSTIC DATA (LABS, IMAGING, TESTING) - I reviewed patient records, labs, notes, testing and imaging myself where  available.  Lab Results  Component Value Date   WBC 4.8 05/25/2023   HGB 13.7 05/25/2023   HCT 40.5 05/25/2023   MCV 93 05/25/2023   PLT 163 05/25/2023      Component Value Date/Time   NA 140 05/25/2023 1441   K 4.3 05/25/2023 1441   CL 103 05/25/2023 1441   CO2 23 05/25/2023 1441   GLUCOSE 91 05/25/2023 1441   GLUCOSE 114 (H) 06/23/2020 0219   BUN 16 05/25/2023 1441   CREATININE 1.12 05/25/2023 1441   CALCIUM 9.8 05/25/2023 1441   PROT 6.9 05/25/2023 1441   ALBUMIN 4.7 05/25/2023 1441   AST 35 05/25/2023 1441   ALT 28 05/25/2023 1441   ALKPHOS 44 05/25/2023 1441   BILITOT 0.5 05/25/2023 1441   GFRNONAA >60 06/23/2020 0219   GFRAA >60 06/23/2020 0219   Lab Results  Component Value Date   CHOL 140 03/12/2022   HDL 47 03/12/2022   LDLCALC 75 03/12/2022   TRIG 93 03/12/2022   CHOLHDL 3.0 03/12/2022   Lab Results  Component Value Date   HGBA1C 6.5 (H) 06/18/2020       ASSESSMENT AND PLAN  Multiple sclerosis (HCC)  Numbness  High risk medication use  Dysesthesia  Gait disturbance  Mixed hyperlipidemia   He is ready for year 2 of Mavenclad.  Check CBC/differential  and liver function today.   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  If symptoms worsen I would want to check a NCV/EMG and consider referral to orthopedics for surgery.  We discussed trying to avoid pressure near the ulnar groove.   return in 3-4 months.  At that time check LFT and CBC with Diff   Dennice Tindol A. Epimenio Foot, MD, [facility] 09/05/2023, 1:53 PM Certified in Neurology, Clinical Neurophysiology, Sleep Medicine and Neuroimaging  [facility] [address], [address] [place], [place] [address] [phone]

## 2023-09-09 LAB — QUANTIFERON-TB GOLD PLUS
QuantiFERON Mitogen Value: >10 [IU]/mL
QuantiFERON Nil Value: 0.05 [IU]/mL
QuantiFERON TB1 Ag Value: 0.06 [IU]/mL
QuantiFERON TB2 Ag Value: 0.05 [IU]/mL
QuantiFERON-TB Gold Plus: NEGATIVE

## 2023-09-09 LAB — COMPREHENSIVE METABOLIC PANEL
ALT: 26 [IU]/L (ref 0–44)
AST: 35 [IU]/L (ref 0–40)
Albumin: 4.6 g/dL (ref 3.9–4.9)
Alkaline Phosphatase: 46 [IU]/L (ref 44–121)
BUN/Creatinine Ratio: 19 (ref 10–24)
BUN: 19 mg/dL (ref 8–27)
Bilirubin Total: 0.4 mg/dL (ref 0.0–1.2)
CO2: 23 mmol/L (ref 20–29)
Calcium: 9.3 mg/dL (ref 8.6–10.2)
Chloride: 104 mmol/L (ref 96–106)
Creatinine, Ser: 1.01 mg/dL (ref 0.76–1.27)
Globulin, Total: 1.9 g/dL (ref 1.5–4.5)
Glucose: 128 mg/dL — ABNORMAL HIGH (ref 70–99)
Potassium: 3.6 mmol/L (ref 3.5–5.2)
Sodium: 140 mmol/L (ref 134–144)
Total Protein: 6.5 g/dL (ref 6.0–8.5)
eGFR: 84 mL/min/{1.73_m2} (ref >59)

## 2023-09-09 LAB — CBC WITH DIFFERENTIAL/PLATELET
Basophils Absolute: 0 10*3/uL (ref 0.0–0.2)
Basos: 0 %
EOS (ABSOLUTE): 0.3 10*3/uL (ref 0.0–0.4)
Eos: 5 %
Hematocrit: 39.6 % (ref 37.5–51.0)
Hemoglobin: 12.9 g/dL — ABNORMAL LOW (ref 13.0–17.7)
Immature Grans (Abs): 0 10*3/uL (ref 0.0–0.1)
Immature Granulocytes: 1 %
Lymphocytes Absolute: 0.6 10*3/uL — ABNORMAL LOW (ref 0.7–3.1)
Lymphs: 12 %
MCH: 31.6 pg (ref 26.6–33.0)
MCHC: 32.6 g/dL (ref 31.5–35.7)
MCV: 97 fL (ref 79–97)
Monocytes Absolute: 0.5 10*3/uL (ref 0.1–0.9)
Monocytes: 9 %
Neutrophils Absolute: 3.7 10*3/uL (ref 1.4–7.0)
Neutrophils: 73 %
Platelets: 143 10*3/uL — ABNORMAL LOW (ref 150–450)
RBC: 4.08 x10E6/uL — ABNORMAL LOW (ref 4.14–5.80)
RDW: 12.8 % (ref 11.6–15.4)
WBC: 5 10*3/uL (ref 3.4–10.8)

## 2023-09-09 LAB — HEPATITIS B SURFACE ANTIGEN: Hepatitis B Surface Ag: NEGATIVE

## 2023-09-09 LAB — HEPATITIS B SURFACE ANTIBODY,QUALITATIVE: Hep B Surface Ab, Qual: NONREACTIVE

## 2023-09-09 LAB — HIV ANTIBODY (ROUTINE TESTING W REFLEX): HIV Screen 4th Generation wRfx: NONREACTIVE

## 2023-09-09 LAB — HEPATITIS B CORE ANTIBODY, TOTAL: Hep B Core Total Ab: NEGATIVE

## 2023-09-12 ENCOUNTER — Telehealth: Payer: Self-pay | Admitting: *Deleted

## 2023-09-12 ENCOUNTER — Other Ambulatory Visit: Payer: Self-pay | Admitting: *Deleted

## 2023-09-12 DIAGNOSIS — G35 Multiple sclerosis: Secondary | ICD-10-CM

## 2023-09-12 DIAGNOSIS — Z79899 Other long term (current) drug therapy: Secondary | ICD-10-CM

## 2023-09-12 NOTE — Telephone Encounter (Signed)
Called pt. Relayed results per Dr. Bonnita Hollow note. Scheduled repeat appt 11/07/23 at 8:00am.  Asked front to try and send reminder when it gets closer to appt time.

## 2023-09-12 NOTE — Telephone Encounter (Signed)
-----   Message from Asa Lente sent at 09/09/2023 10:51 AM EST ----- We did lab work for your to [employer].  Most of the labs look great.  However, his lymphocyte count is still a little bit low to do the second year of Mavenclad..  I would like him to repeat the CBC with differential in about 2 months.

## 2023-10-17 ENCOUNTER — Other Ambulatory Visit (HOSPITAL_COMMUNITY): Payer: Self-pay

## 2023-10-17 ENCOUNTER — Telehealth: Payer: Self-pay

## 2023-10-17 NOTE — Telephone Encounter (Signed)
Pharmacy Patient Advocate Encounter   Received notification from CoverMyMeds that prior authorization for Mavenclad (7 Tabs) 10MG  tablets is required/requested.   Insurance verification completed.   The patient is insured through [employer] .   Per test claim: PA required; PA submitted to above mentioned insurance via CoverMyMeds Key/confirmation #/EOC [id number] Status is pending

## 2023-10-21 NOTE — Telephone Encounter (Signed)
Pharmacy Patient Advocate Encounter  Received notification from [employer] that Prior Authorization for Mavenclad (7 Tabs) 10MG  tablets has been APPROVED from 10/17/23 to 10/16/24   PA #/Case ID/Reference #: PA Case ID #: [id number]

## 2023-10-24 ENCOUNTER — Telehealth: Payer: Self-pay

## 2023-10-24 NOTE — Telephone Encounter (Signed)
Called pt and asked him Per Dr. Epimenio Foot if he can come to get his lab work redrawn on 11/07/2023 @ 9:00am. Pt said that he will be here.

## 2023-11-07 ENCOUNTER — Other Ambulatory Visit (INDEPENDENT_AMBULATORY_CARE_PROVIDER_SITE_OTHER): Payer: Commercial Managed Care - HMO

## 2023-11-07 ENCOUNTER — Other Ambulatory Visit: Payer: Self-pay | Admitting: Neurology

## 2023-11-07 DIAGNOSIS — Z79899 Other long term (current) drug therapy: Secondary | ICD-10-CM

## 2023-11-07 DIAGNOSIS — G35 Multiple sclerosis: Secondary | ICD-10-CM

## 2023-11-07 DIAGNOSIS — Z0289 Encounter for other administrative examinations: Secondary | ICD-10-CM

## 2023-11-08 LAB — CBC WITH DIFFERENTIAL/PLATELET
Basophils Absolute: 0 10*3/uL (ref 0.0–0.2)
Basos: 0 %
EOS (ABSOLUTE): 0.1 10*3/uL (ref 0.0–0.4)
Eos: 1 %
Hematocrit: 41.9 % (ref 37.5–51.0)
Hemoglobin: 14 g/dL (ref 13.0–17.7)
Immature Grans (Abs): 0 10*3/uL (ref 0.0–0.1)
Immature Granulocytes: 0 %
Lymphocytes Absolute: 0.7 10*3/uL (ref 0.7–3.1)
Lymphs: 7 %
MCH: 32 pg (ref 26.6–33.0)
MCHC: 33.4 g/dL (ref 31.5–35.7)
MCV: 96 fL (ref 79–97)
Monocytes Absolute: 0.6 10*3/uL (ref 0.1–0.9)
Monocytes: 6 %
Neutrophils Absolute: 7.9 10*3/uL — ABNORMAL HIGH (ref 1.4–7.0)
Neutrophils: 86 %
Platelets: 207 10*3/uL (ref 150–450)
RBC: 4.38 x10E6/uL (ref 4.14–5.80)
RDW: 12.5 % (ref 11.6–15.4)
WBC: 9.4 10*3/uL (ref 3.4–10.8)

## 2023-11-08 NOTE — Telephone Encounter (Signed)
 Pt LVM at 3:36 pm returning phone call

## 2023-11-08 NOTE — Telephone Encounter (Signed)
 I spoke with Dr. Vear.  Patient's lymphocyte count is now 0.7.  He would prefer to be 0.8 or higher to start Mavenclad again.  He will need to recheck labs again in 2 months.  We will check a CBC with differential in about 2 months.  I called patient to discuss.  No answer, left a voicemail asking him to call me back.

## 2023-11-08 NOTE — Addendum Note (Signed)
 Addended by: Geronimo Running A on: 11/08/2023 11:28 AM   Modules accepted: Orders

## 2023-11-08 NOTE — Telephone Encounter (Signed)
 Last seen on 09/05/23 Follow up scheduled on 03/20/24 Last filled on 10/12/23 #30 (30 day supply) Rx pending to be signed

## 2023-11-09 NOTE — Telephone Encounter (Signed)
 Patient called back , returning call to [facility]. No one in POD 1 available for call. Let him know someone would be available to speak with him as soon as they are available

## 2023-11-09 NOTE — Telephone Encounter (Signed)
 I called patient again to discuss.  No answer, left a voicemail asking him to call us back.  Please route to POD 1 when he calls back.

## 2023-11-09 NOTE — Telephone Encounter (Signed)
 Called the pt back and discussed his lymphocyte level is still a little bit too low to start year 2 of Mavenclad. It needs to be 0.8 or higher per Dr Vear. The patient will come back in 2 months on 02/06/24 to have CBC w/ diff redrawn. He thanked me for the call.   Appointment reminder mailed to patient.

## 2023-11-16 ENCOUNTER — Ambulatory Visit: Payer: Commercial Managed Care - HMO | Admitting: Neurology

## 2023-11-16 ENCOUNTER — Telehealth: Payer: Self-pay | Admitting: *Deleted

## 2023-11-16 NOTE — Telephone Encounter (Signed)
Faxed completed/signed Mavenclad start form to [employer] at [phone]. Received fax confirmation.

## 2023-11-16 NOTE — Telephone Encounter (Signed)
 Called pt at [phone]. Relayed per Dr. Godwin Lat that labs ok to proceed with yr 2 Mavenclad. He will come by this week to drop off copy of insurance cards for 2025 to send in with start form.

## 2023-11-17 NOTE — Telephone Encounter (Signed)
   Is he cleared for therapy with a lymphocyte count of 0.7? We've been trying to reach him to ask him to come in for repeat labs in 2 months.

## 2023-11-17 NOTE — Telephone Encounter (Signed)
I attempted PA for Mavenclad via CMM. Received this notice: "An active PA is already on file with expiration date of 10/16/2024. Please wait to resubmit request within 60 days of that expiration date to obtain a PA renewal."

## 2023-11-17 NOTE — Telephone Encounter (Signed)
I spoke with Kara Mead, RN. She spoke with Dr. Epimenio Foot and a lymphocyte count of 0.7 is acceptable. Will begin processing form.

## 2023-12-01 NOTE — Telephone Encounter (Signed)
I called patient.  He reports that he has not received his Mavenclad yet.  I will check with [employer] on this.  I called Addie at [employer] to check on this and am waiting on a call back.

## 2023-12-05 NOTE — Telephone Encounter (Signed)
Called [facility]. The weight written in on the script was 155 lbs and the last recorded  weight is for 151 lbs and the correct dosing is selected for the 151 lbs. Advised that the weight should be corrected but the dose selected is correct. She was appreciative for the information

## 2023-12-05 NOTE — Telephone Encounter (Signed)
Mary from [facility] is calling. They are requesting clarification on the patient's prescription and weight for Mavenclad.

## 2023-12-12 NOTE — Telephone Encounter (Signed)
 Called pt. Med arrived this past Friday. Advised he can go ahead and start taking. He received first month. He will call about setting up shipment for second month. [employer]: [phone]. He will call back if any further questions come up.

## 2023-12-12 NOTE — Telephone Encounter (Addendum)
 Pt said have received Cladribine, 7 Tabs, (MAVENCLAD, 7 TABS,) 10 MG TBPK in the mail. Would like a call back to discuss when to start taking the medication.

## 2023-12-20 NOTE — Telephone Encounter (Signed)
Called and spoke w/ pt. [phone]. He confirmed he took Mavenclad M1Y2 12/12/23-12/16/23. Had fever first day but did not take any meds for fever and it resolved after day 1. Otherwise, tolerated well. He has not scheduled shipment for M2Y2. I encouraged him to call to set up shipment to ensure he stays on schedule. He verbalized understanding and will call our office if he has difficulty with this.

## 2024-02-06 ENCOUNTER — Other Ambulatory Visit (INDEPENDENT_AMBULATORY_CARE_PROVIDER_SITE_OTHER): Payer: Commercial Managed Care - HMO

## 2024-02-06 DIAGNOSIS — G35 Multiple sclerosis: Secondary | ICD-10-CM

## 2024-02-06 DIAGNOSIS — Z79899 Other long term (current) drug therapy: Secondary | ICD-10-CM

## 2024-02-06 DIAGNOSIS — Z0289 Encounter for other administrative examinations: Secondary | ICD-10-CM

## 2024-02-07 LAB — CBC WITH DIFFERENTIAL/PLATELET
Basophils Absolute: 0 10*3/uL (ref 0.0–0.2)
Basos: 1 %
EOS (ABSOLUTE): 1 10*3/uL — ABNORMAL HIGH (ref 0.0–0.4)
Eos: 16 %
Hematocrit: 39.9 % (ref 37.5–51.0)
Hemoglobin: 13.5 g/dL (ref 13.0–17.7)
Immature Grans (Abs): 0.1 10*3/uL (ref 0.0–0.1)
Immature Granulocytes: 1 %
Lymphocytes Absolute: 0.4 10*3/uL — ABNORMAL LOW (ref 0.7–3.1)
Lymphs: 7 %
MCH: 32.1 pg (ref 26.6–33.0)
MCHC: 33.8 g/dL (ref 31.5–35.7)
MCV: 95 fL (ref 79–97)
Monocytes Absolute: 0.6 10*3/uL (ref 0.1–0.9)
Monocytes: 11 %
Neutrophils Absolute: 3.9 10*3/uL (ref 1.4–7.0)
Neutrophils: 64 %
Platelets: 146 10*3/uL — ABNORMAL LOW (ref 150–450)
RBC: 4.2 x10E6/uL (ref 4.14–5.80)
RDW: 14.2 % (ref 11.6–15.4)
WBC: 6 10*3/uL (ref 3.4–10.8)

## 2024-02-27 ENCOUNTER — Telehealth: Payer: Self-pay | Admitting: Cardiovascular Disease

## 2024-02-27 MED ORDER — NITROGLYCERIN 0.4 MG SL SUBL: 0.4000 mg | SUBLINGUAL_TABLET | SUBLINGUAL | 3 refills | Status: AC | PRN

## 2024-02-27 NOTE — Telephone Encounter (Signed)
 Spoke with pt over the phone and he explained that this chest discomfort has been occurring off and on for about 1 month and can occur in different areas at times but denies that it feels muscular. Pt states that the discomfort occurs at both rest and activity. He stated he does have an appt with Dr. Stann Earnest 6/6 but wanted to be seen sooner if possible to be evaluated. Schedule pt with Charles Connor, NP for 5/13. ED precautions explained to pt, pt verbalized understanding and agreed to plan.

## 2024-02-27 NOTE — Telephone Encounter (Signed)
 Pt c/o of Chest Pain: STAT if active (IN THIS MOMENT) CP, including tightness, pressure, jaw pain, shoulder/upper arm/back pain, SOB, nausea, and vomiting.  1. Are you having CP right now (tightness, pressure, or discomfort)?  No pain at all, doesn't feel heavy. Just feels "weird."  2. Are you experiencing any other symptoms (ex. SOB, nausea, vomiting, sweating)?  No   3. How long have you been experiencing CP?  About 1 month   4. Is your CP continuous or coming and going?  Coming and going   5. Have you taken Nitroglycerin ?  Patient says the only time he's taken nitroglycerin  is when he was having a heart attack and the paramedic gave it to him

## 2024-02-27 NOTE — Telephone Encounter (Signed)
 Loyde Rule, MD  You14 minutes ago (3:20 PM)   He hasn't been seen in about 4 years. He has had 2 bypasses. Needs to see DOD this week and arrange cath. Can be seen as new patient for angina Make sure he has SL nitro    Called patient to let them know Dr. Francie Irani advisement. Patient agreed to plan. Patient will see Dr. Berry Bristol on Thursday. Sent patient refill on nitroglycerin .

## 2024-02-27 NOTE — Addendum Note (Signed)
 Addended by: Reyes Caul, Darothy Courtright L on: 02/27/2024 03:52 PM   Modules accepted: Orders

## 2024-02-28 ENCOUNTER — Other Ambulatory Visit: Payer: Self-pay | Admitting: Neurology

## 2024-02-29 NOTE — Progress Notes (Signed)
 Cardiology Office Note:  .   Date:  03/01/2024  ID:  Gregory Gardner, DOB November 13, 1960, MRN [id number] PCP: Bertrum Brodie, MD  [facility] Providers Cardiologist:  Janelle Mediate, MD   History of Present Illness: .   Gregory Gardner is a 63 y.o. 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  His EF is improved and normalized by echocardiogram on 02/01/2022 with medical therapy.   Past medical history significant for hypercholesterolemia, diabetes mellitus diet-controlled.  Noncardiac issues include multiple sclerosis and follows neurology. He is being seen on an urgent basis due to chest pain.   Discussed the use of AI scribe software for clinical note transcription with the patient, who gave verbal consent to proceed.  History of Present Illness Gregory Gardner is a 63 year old male with coronary artery disease. Chest pain has been present for two to three weeks, described as a sensation in the upper left chest, occurring three times, including once centrally. The most recent episode occurred yesterday while sitting. Pain is random with or without exertion however patient states that he is sedentary.  Chest pain is associated with shortness of breath on exertion.  No PND or orthopnea.  No recent heavy exertion. No shortness of breath or significant dizziness, though mild dizziness occurs when standing quickly. He climbed two flights of stairs today without difficulty. He takes metoprolol  for heart disease and Plavix . He consumes BC powder for back pain, two to three doses daily. He manages diabetes through diet. He drinks two to three alcoholic beverages daily, mainly light beer, and does not smoke.   Labs   Lab Results  Component Value Date   CHOL 201 (H) 03/01/2024    HDL 53 03/01/2024   LDLCALC 133 (H) 03/01/2024   TRIG 84 03/01/2024   CHOLHDL 3.8 03/01/2024   Lab Results  Component Value Date   NA 140 09/05/2023   K 3.6 09/05/2023   CO2 23 09/05/2023   GLUCOSE 128 (H) 09/05/2023   BUN 19 09/05/2023   CREATININE 1.01 09/05/2023   CALCIUM  9.3 09/05/2023   EGFR 84 09/05/2023   GFRNONAA >60 06/23/2020      Latest Ref Rng & Units 09/05/2023    1:58 PM 05/25/2023    2:41 PM 09/16/2022   10:17 AM  BMP  Glucose 70 - 99 mg/dL 130  91  865   BUN 8 - 27 mg/dL 19  16  15    Creatinine 0.76 - 1.27 mg/dL 7.84  6.96  2.95   BUN/Creat Ratio 10 - 24 19  14  16    Sodium 134 - 144 mmol/L 140  140  141   Potassium 3.5 - 5.2 mmol/L 3.6  4.3  4.2   Chloride 96 - 106 mmol/L 104  103  102   CO2 20 - 29 mmol/L 23  23  24    Calcium  8.6 - 10.2 mg/dL 9.3  9.8  9.6       Latest Ref Rng & Units 02/06/2024    9:56 AM 11/07/2023   10:04 AM 09/05/2023    1:58 PM  CBC  WBC 3.4 - 10.8 x10E3/uL 6.0  9.4  5.0   Hemoglobin 13.0 - 17.7 g/dL 28.4  13.2  44.0   Hematocrit 37.5 - 51.0 % 39.9  41.9  39.6   Platelets 150 - 450 x10E3/uL 146  207  143    Lab Results  Component Value Date   HGBA1C 6.5 (H) 06/18/2020    No results found for: "TSH"   ROS  Review of Systems  Cardiovascular:  Positive for chest pain and dyspnea on exertion. Negative for leg swelling.    Physical Exam:   VS:  BP (!) 90/58 (BP Location: Left Arm, Patient Position: Sitting, Cuff Size: Normal)   Pulse (!) 45   Resp 16   Ht 5\' 11"  (1.803 m)   Wt 155 lb 6.4 oz (70.5 kg)   SpO2 98%   BMI 21.67 kg/m    Wt Readings from Last 3 Encounters:  03/01/24 155 lb 6.4 oz (70.5 kg)  09/05/23 151 lb 8 oz (68.7 kg)  05/25/23 153 lb (69.4 kg)   Physical Exam Neck:     Vascular: No carotid bruit or JVD.  Cardiovascular:     Rate and Rhythm: Normal rate and regular rhythm.     Pulses: Intact distal pulses.     Heart sounds: Normal heart sounds. No murmur heard.    No gallop.  Pulmonary:     Effort:  Pulmonary effort is normal.     Breath sounds: Normal breath sounds.  Abdominal:     General: Bowel sounds are normal.     Palpations: Abdomen is soft.  Musculoskeletal:     Right lower leg: No edema.     Left lower leg: No edema.    Studies Reviewed: Aaron Aas    CARDIAC CATHETERIZATION 06/16/2020: Due to multivessel disease, redo CABG recommended.  CABG x 3: 06/18/2020:  Redo Sternotomy, CABG x 3  Sequential SVG to Diagonal and OM, and RIMA to SVG graft to RCA. LIMA to LAD patent. Native Cx, LAD, RCA occluded.  ECHOCARDIOGRAM COMPLETE 02/01/2022  1. Left ventricular ejection fraction, by estimation, is 60 to 65%. Left ventricular ejection fraction by 3D volume is 65 %. The left ventricle has normal function. The left ventricle has no regional wall motion abnormalities. Left ventricular diastolic parameters were normal. 2. Right ventricular systolic function is normal. The right ventricular size is mildly enlarged. There is normal pulmonary artery systolic pressure. The estimated right ventricular systolic pressure is 28.4 mmHg. 3. The mitral valve is normal in structure. Trivial mitral valve regurgitation. No evidence of mitral stenosis. 4. The aortic valve is tricuspid. Aortic valve regurgitation is not visualized. Aortic valve sclerosis is present, with no evidence of aortic valve stenosis. 5. The inferior vena cava is normal in size with greater than 50% respiratory variability, suggesting right atrial pressure of 3 mmHg.   EKG:    EKG Interpretation Date/Time:  Thursday Mar 01 2024 09:35:41 EDT Ventricular Rate:  46 PR Interval:  170 QRS Duration:  94 QT Interval:  440 QTC Calculation: 385 R Axis:   84  Text Interpretation: EKG 03/01/2024: Sinus bradycardia.  46 bpm otherwise normal EKG.  Compared to 06/19/2020, normal sinus rhythm at the rate of 78 bpm with high lateral T wave inversion suggestive of subendocardial infarct versus ischemia. Confirmed by Nidia Grogan, Jagadeesh ([id number]) on 03/01/2024  10:04:49 AM    Medications and allergies    Allergies  Allergen Reactions   Lodine [Etodolac] Nausea Only     Current Outpatient Medications:    Albuterol  Sulfate (VENTOLIN  HFA IN), Inhale 1 puff into the lungs daily as needed (for wheezing). , Disp: , Rfl:    alendronate (FOSAMAX) 70 MG tablet, Take 70 mg  by mouth once a week. Take with a full glass of water on an empty stomach., Disp: , Rfl:    Aspirin -Salicylamide-Caffeine (BC HEADACHE POWDER PO), Take by mouth as needed., Disp: , Rfl:    Cladribine, 7 Tabs, (MAVENCLAD, 7 TABS,) 10 MG TBPK, Take by mouth. Month 1: 20mg  on day 1-2. 10mg  on day 3-5. Month 2: Same as month one., Disp: , Rfl:    clopidogrel  (PLAVIX ) 75 MG tablet, Take 1 tablet (75 mg total) by mouth daily., Disp: 90 tablet, Rfl: 3   ezetimibe  (ZETIA ) 10 MG tablet, Take 10 mg by mouth daily., Disp: , Rfl:    fenofibrate  micronized (LOFIBRA) 134 MG capsule, Take 134 mg by mouth daily., Disp: , Rfl:    losartan  (COZAAR ) 25 MG tablet, TAKE 1 TABLET BY MOUTH DAILY, Disp: 90 tablet, Rfl: 3   metoprolol  succinate (TOPROL -XL) 25 MG 24 hr tablet, Take 25 mg by mouth daily., Disp: , Rfl:    nitroGLYCERIN  (NITROSTAT ) 0.4 MG SL tablet, Place 1 tablet (0.4 mg total) under the tongue every 5 (five) minutes as needed for chest pain., Disp: 25 tablet, Rfl: 3   pantoprazole  (PROTONIX ) 40 MG tablet, Take 1 tablet (40 mg total) by mouth daily., Disp: 30 tablet, Rfl: 2   pregabalin  (LYRICA ) 150 MG capsule, TAKE 1 CAPSULE BY MOUTH AT BEDTIME., Disp: 30 capsule, Rfl: 2   rosuvastatin  (CRESTOR ) 20 MG tablet, TAKE 1 TABLET BY MOUTH DAILY., Disp: 15 tablet, Rfl: 0   VITAMIN D PO, Take 1,000 Units by mouth daily., Disp: , Rfl:    No orders of the defined types were placed in this encounter.    Medications Discontinued During This Encounter  Medication Reason   ocrelizumab 300 mg in sodium chloride  0.9 % 250 mL Patient Preference   spironolactone  (ALDACTONE ) 25 MG tablet Discontinued by provider      ASSESSMENT AND PLAN: .      ICD-10-CM   1. Coronary artery disease of native artery of native heart with stable angina pectoris (HCC)  I25.118 EKG 12-Lead    Cardiac Stress Test: Informed Consent Details: Physician/Practitioner Attestation; Transcribe to consent form and obtain patient signature    MYOCARDIAL PERFUSION IMAGING    2. Mixed hyperlipidemia  E78.2 Lipid Profile    3. Type 2 diabetes mellitus without complication, without long-term current use of insulin  (HCC)  E11.9 Cardiac Stress Test: Informed Consent Details: Physician/Practitioner Attestation; Transcribe to consent form and obtain patient signature      Assessment and Plan Assessment & Plan Coronary artery disease with angina pectoris   He has experienced intermittent chest pain for 2-3 weeks, occurring randomly, including at rest and also with activity, with associated dyspnea. He has a history of two bypass surgeries. EKG shows bradycardia. A stress test will evaluate cardiac status before adjusting medications. Bleeding risk with clopidogrel  and BC powder was discussed. Order an exercise nuclear stress test. Hold metoprolol  one day before and on the day of the stress test. Continue DAPT until stress test results are available and then we will consider either discontinuing aspirin  or Plavix .  His symptoms are suggestive of chronic stable angina however extremely high risk individual. He has not had a stress test after his second bypass surgery.  Hypertension He has occasional dizziness.  Blood pressure is very soft at 90/58 mmHg.  Discontinue spironolactone  due to hypotension.  Mixed hyperlipidemia   Cholesterol levels were previously reported as optimal by another provider, but current status is unknown due to lack of  shared records from [facility]. Order a lipid panel. Is also being managed with Crestor  20 mg along with Zetia  10 mg daily for mixed hypercholesterolemia.  Lipid profile has been resulted  today, we will discuss management of this on his next office visit after the stress test.  LDL is not at goal.  Diet-controlled diabetes mellitus   Diabetes is managed through diet, with no current medication due to adverse effects. Emphasize maintaining a healthy diet to manage diabetes and reduce cardiovascular risk. Continue dietary management for diabetes.  Signed,  Knox Perl, MD, [facility] 03/01/2024, 10:11 PM [facility] [address] [place], [place] [address] Phone: [phone]. Fax:  [phone]

## 2024-03-01 ENCOUNTER — Encounter: Payer: Self-pay | Admitting: Cardiology

## 2024-03-01 ENCOUNTER — Encounter: Payer: Self-pay | Admitting: *Deleted

## 2024-03-01 ENCOUNTER — Ambulatory Visit: Attending: Cardiology | Admitting: Cardiology

## 2024-03-01 VITALS — BP 90/58 | HR 45 | Resp 16 | Ht 71.0 in | Wt 155.4 lb

## 2024-03-01 DIAGNOSIS — E119 Type 2 diabetes mellitus without complications: Secondary | ICD-10-CM | POA: Diagnosis not present

## 2024-03-01 DIAGNOSIS — I25118 Atherosclerotic heart disease of native coronary artery with other forms of angina pectoris: Secondary | ICD-10-CM | POA: Diagnosis not present

## 2024-03-01 DIAGNOSIS — E782 Mixed hyperlipidemia: Secondary | ICD-10-CM

## 2024-03-01 LAB — LIPID PANEL
Chol/HDL Ratio: 3.8 ratio (ref 0.0–5.0)
Cholesterol, Total: 201 mg/dL — ABNORMAL HIGH (ref 100–199)
HDL: 53 mg/dL (ref >39)
LDL Chol Calc (NIH): 133 mg/dL — ABNORMAL HIGH (ref 0–99)
Triglycerides: 84 mg/dL (ref 0–149)
VLDL Cholesterol Cal: 15 mg/dL (ref 5–40)

## 2024-03-01 NOTE — Progress Notes (Signed)
 His LDL is not at goal, has increased compared to a year ago, do not know whether he is taking his medications or not.  We will discuss further management on his next office visit.

## 2024-03-01 NOTE — Patient Instructions (Addendum)
 Medication Instructions:  Your physician has recommended you make the following change in your medication: Stop spironolactone   *If you need a refill on your cardiac medications before your next appointment, please call your pharmacy*  Lab Work: Have lab work (lipid profile) checked today at [facility] on the first floor If you have labs (blood work) drawn today and your tests are completely normal, you will receive your results only by: MyChart Message (if you have MyChart) OR A paper copy in the mail If you have any lab test that is abnormal or we need to change your treatment, we will call you to review the results.  Testing/Procedures: Your physician has requested that you have an exercise stress myoview . For further information please visit [email or web address] Please follow instruction sheet, as given.   Follow-Up: At [facility], you and your health needs are our priority.  As part of our continuing mission to provide you with exceptional heart care, our providers are all part of one team.  This team includes your primary Cardiologist (physician) and Advanced Practice Providers or APPs (Physician Assistants and Nurse Practitioners) who all work together to provide you with the care you need, when you need it.  Your next appointment:   June 6  Provider:   Janelle Mediate, MD    We recommend signing up for the patient portal called "MyChart".  Sign up information is provided on this After Visit Summary.  MyChart is used to connect with patients for Virtual Visits (Telemedicine).  Patients are able to view lab/test results, encounter notes, upcoming appointments, etc.  Non-urgent messages can be sent to your provider as well.   To learn more about what you can do with MyChart, go to [email or web address].   Other Instructions

## 2024-03-07 ENCOUNTER — Telehealth (HOSPITAL_COMMUNITY): Payer: Self-pay | Admitting: *Deleted

## 2024-03-07 NOTE — Telephone Encounter (Signed)
 Patient given detailed instructions per Myocardial Perfusion Study Information Sheet for the test on 03/14/24 Patient notified to arrive 15 minutes early and that it is imperative to arrive on time for appointment to keep from having the test rescheduled.  If you need to cancel or reschedule your appointment, please call the office within 24 hours of your appointment. . Patient verbalized understanding.Gregory Gardner

## 2024-03-12 ENCOUNTER — Other Ambulatory Visit: Payer: Self-pay | Admitting: Cardiology

## 2024-03-12 DIAGNOSIS — I25118 Atherosclerotic heart disease of native coronary artery with other forms of angina pectoris: Secondary | ICD-10-CM

## 2024-03-13 ENCOUNTER — Ambulatory Visit: Admitting: Nurse Practitioner

## 2024-03-14 ENCOUNTER — Ambulatory Visit (HOSPITAL_COMMUNITY): Attending: Cardiology

## 2024-03-14 DIAGNOSIS — I25118 Atherosclerotic heart disease of native coronary artery with other forms of angina pectoris: Secondary | ICD-10-CM

## 2024-03-14 MED ORDER — TECHNETIUM TC 99M TETROFOSMIN IV KIT
10.9000 | PACK | Freq: Once | INTRAVENOUS | Status: AC | PRN
Start: 2024-03-14 — End: 2024-03-14
  Administered 2024-03-14: 10.9 via INTRAVENOUS

## 2024-03-14 MED ORDER — TECHNETIUM TC 99M TETROFOSMIN IV KIT
31.7000 | PACK | Freq: Once | INTRAVENOUS | Status: AC | PRN
Start: 2024-03-14 — End: 2024-03-14
  Administered 2024-03-14: 31.7 via INTRAVENOUS

## 2024-03-16 ENCOUNTER — Ambulatory Visit: Payer: Self-pay | Admitting: Cardiology

## 2024-03-16 DIAGNOSIS — I25118 Atherosclerotic heart disease of native coronary artery with other forms of angina pectoris: Secondary | ICD-10-CM

## 2024-03-16 DIAGNOSIS — E782 Mixed hyperlipidemia: Secondary | ICD-10-CM

## 2024-03-16 NOTE — Progress Notes (Signed)
 I saw the patient for you. Can you please review, you are seeing her on 04/06/24

## 2024-03-20 ENCOUNTER — Ambulatory Visit (INDEPENDENT_AMBULATORY_CARE_PROVIDER_SITE_OTHER): Payer: Managed Care, Other (non HMO) | Admitting: Neurology

## 2024-03-20 ENCOUNTER — Encounter: Payer: Self-pay | Admitting: Neurology

## 2024-03-20 VITALS — BP 119/68 | HR 44 | Ht 71.0 in | Wt 156.5 lb

## 2024-03-20 DIAGNOSIS — R269 Unspecified abnormalities of gait and mobility: Secondary | ICD-10-CM

## 2024-03-20 DIAGNOSIS — Z79899 Other long term (current) drug therapy: Secondary | ICD-10-CM

## 2024-03-20 DIAGNOSIS — M5416 Radiculopathy, lumbar region: Secondary | ICD-10-CM | POA: Diagnosis not present

## 2024-03-20 DIAGNOSIS — G35 Multiple sclerosis: Secondary | ICD-10-CM | POA: Diagnosis not present

## 2024-03-20 MED ORDER — LAMOTRIGINE 25 MG PO TABS: ORAL_TABLET | ORAL | 5 refills | Status: DC

## 2024-03-20 MED ORDER — ALPRAZOLAM 0.5 MG PO TABS: ORAL_TABLET | ORAL | 0 refills | Status: DC

## 2024-03-20 NOTE — Progress Notes (Signed)
 [facility]  PATIENT: Gregory Gardner DOB: 1961-10-23  REFERRING DOCTOR OR PCP:   Bertrum Brodie, MD SOURCE: Patient, notes from [facility], imaging and lab reports, MRI images personally reviewed.  _________________________________   HISTORICAL  CHIEF COMPLAINT:  Chief Complaint  Patient presents with   RM 11/MS    Pt is here Alone. Pt states that his his left leg to his foot has been on fire today. Pt states that his right knee is numb. Pt states that his right elbow has been bothering him.     HISTORY OF PRESENT ILLNESS:   Gregory Gardner is a 63 y.o. man with multiple sclerosis  UPDATE 03/20/2024 He did the two weeks of Mavenclad December 2023 and January 2024 and year 2 March/April 2025. This yer both 5 day courses were tolerated well with no s.e..  He denies any exacerbations.  His gait is stable and he can walk a couple miles.   He does not need the rail to go downstairs..   His left leg is a little weaker and he notes it often feels numb.  It sometimes gives out on him.    He also reports LBP.   He has mild spasticity at times.  His arms are strong but he has right arm/elbow numbness that seems positional.  .    He has newer numbness under the left shoulder blade and in he right arm into the 4th and fifth fingers.  The right arm numbness can sometimes be painful and bothers him at night at times.  He has urinary urgency and very rare incontinence, sometimes at night.    Sometime she has hesitancy and incompletely empties.     He has reduced vision, OS worse than OD  Changes have seemed gradual.   He denies vertigo.  He has some fatigue but tries to stay active.    He sleeps ok many nights but he has some dysesthesias and pain.      He denies mood or cognitive issues.  He has CAD and has had MI x 2.  He has had CABG twice.   He has HTN and elevated cholesterol and mild Type 2 NIDDM (not on any medication).  He also has had elevated CRT in 2023 but not  2022. , He denies heart failure.       MS History: He ws diagnosed with MS in 1999 after presenting with gait disturbance.   He had imaging studies c/w MS but reports he did not see a neurologist.unitl a few years later (Dr. Applegate).  He was initially on Betaseron.  He felt poorly on it and also had an exacerbation with poor balance.  Therefore, he switched to Tecfidera around 2012/2013.   He started seeing Dr. Brain Cahill after Dr. Shari Daughters retired.   Due to some progression, he was switched to Ocrevus and 2020 - last infusion was late 2022, reportedly due to insurance issues.   He feels he has not had a clinical exacerbation x years but has had some progression of gait and vision issues.   He had year 1 Mavenclad Nov/Dec 2024  Imaging: MRI of the brain 07/11/2023 showed Scattered T2/FLAIR hyperintense foci in the periventricular and deep white matter of the cerebral hemispheres. None of the foci enhance or appear to be acute. Compared to the MRI from 11/11/2016, there were no new lesions. There are nonspecific, these are consistent with chronic demyelinating plaques associated with multiple sclerosis.   MRI cervical spine 07/11/2023 shwed  T2 hyperintense foci within the spinal cord adjacent to C2-C3 and C5.  These were also noted on the previous MRI from 2018.  They do not enhance.    Minimal disc degenerative changes at C4-C5 and C5-C6 do not lead to spinal stenosis or nerve root compression.  MRI of the head 11/11/2016.  It shows multiple T2/FLAIR hyperintense foci in the periventricular white matter with some foci in the deep white matter.  None of them enhanced.  MRI of the cervical spine 11/11/2016 showed hyperintense foci posteriorly at C2-C3, anteriorly at C5, laterally to the left at C6.  MRI of the thoracic spine showed some foci in the mid thoracic spine.  None of the foci enhanced after contrast  MRI lumbar 2021 reports show "Small central focal disc protrusion at L5-S1 may impinge on the  traversing S1 nerve roots.  Minimal multifactorial spinal and foraminal stenosis at L3-4 and L4-5     LABS: He was JCV Ab positive and anti-NMO negative and Hep B core IgG/IgM negative 08/03/2017  REVIEW OF SYSTEMS: Constitutional: No fevers, chills, sweats, or change in appetite Eyes: No visual changes, double vision, eye pain Ear, nose and throat: No hearing loss, ear pain, nasal congestion, sore throat Cardiovascular: No chest pain, palpitations.  He has CAD/MI and history of CABG Respiratory:  No shortness of breath at rest or with exertion.   No wheezes GastrointestinaI: No nausea, vomiting, diarrhea, abdominal pain, fecal incontinence Genitourinary:  No dysuria, urinary retention or frequency.  No nocturia. Musculoskeletal: He reports back pain and sometimes leg pain. Integumentary: No rash, pruritus, skin lesions Neurological: as above Psychiatric: No depression at this time.  No anxiety Endocrine: No palpitations, diaphoresis, change in appetite, change in weigh or increased thirst Hematologic/Lymphatic:  No anemia, purpura, petechiae. Allergic/Immunologic: No itchy/runny eyes, nasal congestion, recent allergic reactions, rashes  ALLERGIES: Allergies  Allergen Reactions   Lodine [Etodolac] Nausea Only    HOME MEDICATIONS:  Current Outpatient Medications:    Albuterol  Sulfate (VENTOLIN  HFA IN), Inhale 1 puff into the lungs daily as needed (for wheezing). , Disp: , Rfl:    alendronate (FOSAMAX) 70 MG tablet, Take 70 mg by mouth once a week. Take with a full glass of water on an empty stomach., Disp: , Rfl:    ALPRAZolam (XANAX) 0.5 MG tablet, Take 2-3 pills before MRI.  Must have a driver, Disp: 3 tablet, Rfl: 0   Aspirin -Salicylamide-Caffeine (BC HEADACHE POWDER PO), Take by mouth as needed., Disp: , Rfl:    clopidogrel  (PLAVIX ) 75 MG tablet, Take 1 tablet (75 mg total) by mouth daily., Disp: 90 tablet, Rfl: 3   ezetimibe  (ZETIA ) 10 MG tablet, Take 10 mg by mouth daily.,  Disp: , Rfl:    fenofibrate  micronized (LOFIBRA) 134 MG capsule, Take 134 mg by mouth daily., Disp: , Rfl:    lamoTRIgine (LAMICTAL) 25 MG tablet, One po every day x 5 days, then one po bid x 5 days, then one po tid x 5 days, then 2 po bid, Disp: 120 tablet, Rfl: 5   losartan  (COZAAR ) 25 MG tablet, TAKE 1 TABLET BY MOUTH DAILY, Disp: 90 tablet, Rfl: 3   metoprolol  succinate (TOPROL -XL) 25 MG 24 hr tablet, Take 25 mg by mouth daily., Disp: , Rfl:    pantoprazole  (PROTONIX ) 40 MG tablet, Take 1 tablet (40 mg total) by mouth daily., Disp: 30 tablet, Rfl: 2   pregabalin  (LYRICA ) 150 MG capsule, TAKE 1 CAPSULE BY MOUTH AT BEDTIME., Disp: 30 capsule, Rfl: 2  rosuvastatin  (CRESTOR ) 20 MG tablet, TAKE 1 TABLET BY MOUTH DAILY., Disp: 15 tablet, Rfl: 0   VITAMIN D PO, Take 1,000 Units by mouth daily., Disp: , Rfl:    Cladribine, 7 Tabs, (MAVENCLAD, 7 TABS,) 10 MG TBPK, Take by mouth. Month 1: 20mg  on day 1-2. 10mg  on day 3-5. Month 2: Same as month one. (Patient not taking: Reported on 03/20/2024), Disp: , Rfl:    nitroGLYCERIN  (NITROSTAT ) 0.4 MG SL tablet, Place 1 tablet (0.4 mg total) under the tongue every 5 (five) minutes as needed for chest pain. (Patient not taking: Reported on 03/20/2024), Disp: 25 tablet, Rfl: 3  PAST MEDICAL HISTORY: Past Medical History:  Diagnosis Date   Abnormal CBC    Actinic keratoses    Asthma    Bladder neoplasm    BMI 21.0-21.9, adult    BPH (benign prostatic hyperplasia)    CAD (coronary artery disease)    s/p Inf STEMI >> S/p CABG in 2003 // Myoview  11/2018: low risk // s/p NSTEMI 8/21 >> s/p redo CABG   Chronic constipation    Chronic lumbar radiculopathy    Chronic pain of right elbow    CKD (chronic kidney disease)    stage 3   Colon polyp    Colonic obstruction (HCC)    DM (diabetes mellitus) (HCC)    ED (erectile dysfunction)    Erysipelas    Fatigue    GERD (gastroesophageal reflux disease)    Hepatic steatosis    HTN (hypertension)     Hypercholesterolemia    Hypomagnesemia    Impacted cerumen, bilateral    Ischemic cardiomyopathy    Echo 8/21: EF 40-45, anterior/anterolateral HK, moderate asymmetric LVH, GR 1 DD, normal RVSF, RVSP 23.2   Lipids abnormal    Low back pain    Lumbar spinal stenosis    Lymph node enlargement    Mixed hyperlipidemia    Intol of statins (?elevated LFTs)   MRSA (methicillin resistant Staphylococcus aureus)    Multiple sclerosis (HCC)    Multiple sclerosis (HCC)    Neuropathy    Nonalcoholic fatty liver disease    Osteoporosis    Seasonal allergies    Seborrheic dermatitis of scalp    Skin lesion    SOB (shortness of breath)    Tinea corporis    Tinea cruris    Urinary bladder neoplasm    Vitamin D deficiency    Weight loss     PAST SURGICAL HISTORY: Past Surgical History:  Procedure Laterality Date   CORONARY ARTERY BYPASS GRAFT N/A 06/18/2020   Procedure: REDO CORONARY ARTERY BYPASS GRAFTING (CABG), ON PUMP, TIMES THREE, USING RIGHT INTERNAL MAMMARY ARTERY AND ENDOSCOPICALLY HARVESTED LEFT GREATER SAPHENOUS VEIN;  Surgeon: Norita Beauvais, MD;  Location: [facility] OR;  Service: Open Heart Surgery;  Laterality: N/A;  SEQENTIAL DIAG TO OM RIMA TO PREVIOUS DISTAL RIGHT VEIN GRAFT   ENDOVEIN HARVEST OF GREATER SAPHENOUS VEIN Left 06/18/2020   Procedure: ENDOVEIN HARVEST OF GREATER SAPHENOUS VEIN;  Surgeon: Norita Beauvais, MD;  Location: [facility] OR;  Service: Open Heart Surgery;  Laterality: Left;   LEFT HEART CATH AND CORS/GRAFTS ANGIOGRAPHY N/A 06/16/2020   Procedure: LEFT HEART CATH AND CORS/GRAFTS ANGIOGRAPHY;  Surgeon: Arty Binning, MD;  Location: [facility] INVASIVE CV LAB;  Service: Cardiovascular;  Laterality: N/A;   MANDIBLE SURGERY     TEE WITHOUT CARDIOVERSION N/A 06/18/2020   Procedure: TRANSESOPHAGEAL ECHOCARDIOGRAM (TEE);  Surgeon: Norita Beauvais, MD;  Location: [facility] OR;  Service: Open  Heart Surgery;  Laterality: N/A;    FAMILY HISTORY: Family History  Problem Relation Age of  Onset   Heart Problems Mother    Heart Problems Father    Heart attack Father    Heart Problems Sister    Heart Problems Brother     SOCIAL HISTORY: Social History   Socioeconomic History   Marital status: Married    Spouse name: Not on file   Number of children: Not on file   Years of education: Not on file   Highest education level: Not on file  Occupational History   Not on file  Tobacco Use   Smoking status: Never   Smokeless tobacco: Never  Substance and Sexual Activity   Alcohol use: Yes    Comment: Beer- socially   Drug use: No   Sexual activity: Not on file  Other Topics Concern   Not on file  Social History Narrative   Right handed   Caffeine use: Diet coke daily   Lives with dogs   Social Drivers of Health   Financial Resource Strain: Not on file  Food Insecurity: Not on file  Transportation Needs: Not on file  Physical Activity: Not on file  Stress: Not on file  Social Connections: Unknown (03/14/2022)   Received from [facility]   Social Network    Social Network: Not on file  Intimate Partner Violence: Unknown (02/04/2022)   Received from [facility], [facility]   HITS    Physically Hurt: Not on file    Insult or Talk Down To: Not on file    Threaten Physical Harm: Not on file    Scream or Curse: Not on file       PHYSICAL EXAM  Vitals:   03/20/24 1454  BP: 119/68  Pulse: (!) 44  Weight: 156 lb 8 oz (71 kg)  Height: 5\' 11"  (1.803 m)    Body mass index is 21.83 kg/m.  No results found.    General: The patient is well-developed and well-nourished and in no acute distress  HEENT:  Head is [place]/AT.  Sclera are anicteric.    Skin: Extremities are without rash or  edema.  Neurologic Exam  Mental status: The patient is alert and oriented x 3 at the time of the examination. The patient has apparent normal recent and remote memory, with an apparently normal attention span and concentration ability.   Speech is  normal.  Cranial nerves: Extraocular movements are full.  Mild reduced acuity OS but colors are symmetric.  Facial strnegth and sensation are normal.  . No obvious hearing deficits are noted.  Motor:  Muscle bulk is normal.   Tone is normal. Strength is  5 / 5 in the arms except for 4+/5 strength in the ulnar innervated hand muscles.  Legs were 5/5 except left  4+/5 iliopsoas and TA/EHL and 5-/5 left quads .   Sensory: Sensory testing is intact to pinprick, soft touch and vibration sensation in the arms and reduced vibration sensation in the left leg.  Intact touch sensation in the legs.  He has Tinel signs at the right elbow.  No Tinel signs at the wrist.  Coordination: Cerebellar testing reveals good finger-nose-finger and heel-to-shin bilaterally.  Gait and station: Station is normal.   He has a slight left leg/foot drop.  Tandem gait is mildly wide. Romberg is negative.   Reflexes: Deep tendon reflexes are symmetric and normal in the arms and increased at the knees, 3+ with  crossed abductors, L>R.  Ankles are 2 ., L>R, with no clonus.        DIAGNOSTIC DATA (LABS, IMAGING, TESTING) - I reviewed patient records, labs, notes, testing and imaging myself where available.  Lab Results  Component Value Date   WBC 6.0 02/06/2024   HGB 13.5 02/06/2024   HCT 39.9 02/06/2024   MCV 95 02/06/2024   PLT 146 (L) 02/06/2024      Component Value Date/Time   NA 140 09/05/2023 1358   K 3.6 09/05/2023 1358   CL 104 09/05/2023 1358   CO2 23 09/05/2023 1358   GLUCOSE 128 (H) 09/05/2023 1358   GLUCOSE 114 (H) 06/23/2020 0219   BUN 19 09/05/2023 1358   CREATININE 1.01 09/05/2023 1358   CALCIUM  9.3 09/05/2023 1358   PROT 6.5 09/05/2023 1358   ALBUMIN  4.6 09/05/2023 1358   AST 35 09/05/2023 1358   ALT 26 09/05/2023 1358   ALKPHOS 46 09/05/2023 1358   BILITOT 0.4 09/05/2023 1358   GFRNONAA >60 06/23/2020 0219   GFRAA >60 06/23/2020 0219   Lab Results  Component Value Date   CHOL 201 (H)  03/01/2024   HDL 53 03/01/2024   LDLCALC 133 (H) 03/01/2024   TRIG 84 03/01/2024   CHOLHDL 3.8 03/01/2024   Lab Results  Component Value Date   HGBA1C 6.5 (H) 06/18/2020       ASSESSMENT AND PLAN  Lumbar radiculopathy - Plan: MR LUMBAR SPINE WO CONTRAST  Multiple sclerosis (HCC) - Plan: MR LUMBAR SPINE WO CONTRAST, CBC with Differential/Platelet, Hepatic function panel  Gait disturbance - Plan: MR LUMBAR SPINE WO CONTRAST  High risk medication use - Plan: CBC with Differential/Platelet, Hepatic function panel  He has completed the 2 years of Mavenclad.  Check CBC/differential and liver function today.  If the lymphocyte count is very low consider prophylaxis with Valtrex. 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  If symptoms worsen I would want to check a NCV/EMG and consider referral to orthopedics for surgery.  We discussed trying to avoid pressure near the ulnar groove.   Leg dysesthesias could be due to MS or degenerative changes in the spine.  We will check an MRI of the lumbar spine..  Lamotrigine for leg dysesthesias.  Titrate the dose further as tolerated or needed. return in 7 months.     Taishaun Levels A. Godwin Lat, MD, Laurita Porta 03/20/2024, 8:22 PM Certified in Neurology, Clinical Neurophysiology, Sleep Medicine and Neuroimaging  [facility] [address], [address] [place], [place] [address] [phone]

## 2024-03-20 NOTE — Patient Instructions (Addendum)
 New medication lamotrigine:  One pill daily x 5 days Then one pill twice a day x 5 days, Then one pill three times a day x 5 days, THEN two pills twice daily

## 2024-03-21 ENCOUNTER — Ambulatory Visit: Payer: Self-pay | Admitting: Neurology

## 2024-03-21 LAB — CBC WITH DIFFERENTIAL/PLATELET
Basophils Absolute: 0 10*3/uL (ref 0.0–0.2)
Basos: 1 %
EOS (ABSOLUTE): 0.5 10*3/uL — ABNORMAL HIGH (ref 0.0–0.4)
Eos: 13 %
Hematocrit: 40.6 % (ref 37.5–51.0)
Hemoglobin: 13.6 g/dL (ref 13.0–17.7)
Immature Grans (Abs): 0 10*3/uL (ref 0.0–0.1)
Immature Granulocytes: 1 %
Lymphocytes Absolute: 0.5 10*3/uL — ABNORMAL LOW (ref 0.7–3.1)
Lymphs: 12 %
MCH: 32.3 pg (ref 26.6–33.0)
MCHC: 33.5 g/dL (ref 31.5–35.7)
MCV: 96 fL (ref 79–97)
Monocytes Absolute: 0.4 10*3/uL (ref 0.1–0.9)
Monocytes: 10 %
Neutrophils Absolute: 2.6 10*3/uL (ref 1.4–7.0)
Neutrophils: 63 %
Platelets: 153 10*3/uL (ref 150–450)
RBC: 4.21 x10E6/uL (ref 4.14–5.80)
RDW: 13.4 % (ref 11.6–15.4)
WBC: 4.1 10*3/uL (ref 3.4–10.8)

## 2024-03-21 LAB — HEPATIC FUNCTION PANEL
ALT: 20 IU/L (ref 0–44)
AST: 25 IU/L (ref 0–40)
Albumin: 4.7 g/dL (ref 3.9–4.9)
Alkaline Phosphatase: 42 IU/L — ABNORMAL LOW (ref 44–121)
Bilirubin Total: 0.6 mg/dL (ref 0.0–1.2)
Bilirubin, Direct: 0.2 mg/dL (ref 0.00–0.40)
Total Protein: 6.8 g/dL (ref 6.0–8.5)

## 2024-03-21 NOTE — Telephone Encounter (Signed)
Called and spoke with pt about lab results per Dr. Sater's note. Pt verbalized understanding.  

## 2024-03-21 NOTE — Telephone Encounter (Signed)
-----   Message from Gregory Gardner sent at 03/21/2024  9:13 AM EDT ----- Please let the patient know that the lab work is fine.  Low lymphocytes are typical after Mavenclad for several months

## 2024-03-22 ENCOUNTER — Telehealth: Payer: Self-pay | Admitting: Neurology

## 2024-03-22 DIAGNOSIS — M5416 Radiculopathy, lumbar region: Secondary | ICD-10-CM

## 2024-03-22 NOTE — Telephone Encounter (Signed)
 sent to GI they obtain [place] [facility] [phone]

## 2024-03-23 NOTE — Progress Notes (Signed)
 Cardiology Office Note:    Date:  04/06/2024   ID:  Gregory Gardner, DOB 1961-10-20, MRN [id number]  PCP:  Gregory Brodie, MD   [facility] Providers Cardiologist:  Gregory Mediate, MD     Referring MD: Gregory Brodie, MD   Chief Complaint: annual follow-up CAD  History of Present Illness:    Gregory Gardner is a 63 y.o. male with a hx of CAD s/p inferior STEMI and CABG 2003, s/p NSTEMI with redo CABG 2021,  s/p NSTEMI, ischemic cardiomyopathy, hyperlipidemia, claudication, former tobacco abuse, and elevated liver enzymes.   He had normal ABIs with no evidence of circulation issues in 2014.   Admission 8/16-8/24/21 with NSTEMI.  Cardiac catheterization demonstrated severe disease in the vein graft to the diagonal and obtuse marginal as well as severe disease in the vein graft to the PDA and second LV branch.  The LIMA to LAD was patent.  The LAD LCx and RCA were all occluded.  EF was 40 to 45% by echo.  He was referred for redo bypass surgery which was performed by Dr. Nicanor Gardner on 06/18/2020 (SVG-DX/OM, RIMA-distal SVG-PDA)..    On 01/19/22 our office received a referral from his primary care provider for an appointment due to symptoms of angina. TTE 2023 EF 60-65%   M yovue done 03/14/24 no ischemia EF 74%    Past Medical History:  Diagnosis Date   Abnormal CBC    Actinic keratoses    Asthma    Bladder neoplasm    BMI 21.0-21.9, adult    BPH (benign prostatic hyperplasia)    CAD (coronary artery disease)    s/p Inf STEMI >> S/p CABG in 2003 // Myoview  11/2018: low risk // s/p NSTEMI 8/21 >> s/p redo CABG   Chronic constipation    Chronic lumbar radiculopathy    Chronic pain of right elbow    CKD (chronic kidney disease)    stage 3   Colon polyp    Colonic obstruction (HCC)    DM (diabetes mellitus) (HCC)    ED (erectile dysfunction)    Erysipelas    Fatigue    GERD (gastroesophageal reflux disease)    Hepatic steatosis    HTN (hypertension)    Hypercholesterolemia     Hypomagnesemia    Impacted cerumen, bilateral    Ischemic cardiomyopathy    Echo 8/21: EF 40-45, anterior/anterolateral HK, moderate asymmetric LVH, GR 1 DD, normal RVSF, RVSP 23.2   Lipids abnormal    Low back pain    Lumbar spinal stenosis    Lymph node enlargement    Mixed hyperlipidemia    Intol of statins (?elevated LFTs)   MRSA (methicillin resistant Staphylococcus aureus)    Multiple sclerosis (HCC)    Multiple sclerosis (HCC)    Neuropathy    Nonalcoholic fatty liver disease    Osteoporosis    Seasonal allergies    Seborrheic dermatitis of scalp    Skin lesion    SOB (shortness of breath)    Tinea corporis    Tinea cruris    Urinary bladder neoplasm    Vitamin D deficiency    Weight loss     Past Surgical History:  Procedure Laterality Date   CORONARY ARTERY BYPASS GRAFT N/A 06/18/2020   Procedure: REDO CORONARY ARTERY BYPASS GRAFTING (CABG), ON PUMP, TIMES THREE, USING RIGHT INTERNAL MAMMARY ARTERY AND ENDOSCOPICALLY HARVESTED LEFT GREATER SAPHENOUS VEIN;  Surgeon: Gregory Beauvais, MD;  Location: [facility] OR;  Service: Open Heart Surgery;  Laterality: N/A;  SEQENTIAL DIAG TO OM RIMA TO PREVIOUS DISTAL RIGHT VEIN GRAFT   ENDOVEIN HARVEST OF GREATER SAPHENOUS VEIN Left 06/18/2020   Procedure: ENDOVEIN HARVEST OF GREATER SAPHENOUS VEIN;  Surgeon: Gregory Beauvais, MD;  Location[facility] OR;  Service: Open Heart Surgery;  Laterality: Left;   LEFT HEART CATH AND CORS/GRAFTS ANGIOGRAPHY N/A 06/16/2020   Procedure: LEFT HEART CATH AND CORS/GRAFTS ANGIOGRAPHY;  Surgeon: Gregory Binning, MD;  Location: [facility] INVASIVE CV LAB;  Service: Cardiovascular;  Laterality: N/A;   MANDIBLE SURGERY     TEE WITHOUT CARDIOVERSION N/A 06/18/2020   Procedure: TRANSESOPHAGEAL ECHOCARDIOGRAM (TEE);  Surgeon: Gregory Beauvais, MD;  Location[facility] OR;  Service: Open Heart Surgery;  Laterality: N/A;    Current Medications: Current Meds  Medication Sig   Albuterol  Sulfate (VENTOLIN  HFA IN) Inhale 1 puff into  the lungs daily as needed (for wheezing).    alendronate (FOSAMAX) 70 MG tablet Take 70 mg by mouth once a week. Take with a full glass of water on an empty stomach.   ALPRAZolam  (XANAX ) 0.5 MG tablet Take 2-3 pills before MRI.  Must have a driver   Aspirin -Salicylamide-Caffeine (BC HEADACHE POWDER PO) Take by mouth as needed.   clopidogrel  (PLAVIX ) 75 MG tablet Take 1 tablet (75 mg total) by mouth daily.   ezetimibe  (ZETIA ) 10 MG tablet Take 10 mg by mouth daily.   fenofibrate  micronized (LOFIBRA) 134 MG capsule Take 134 mg by mouth daily.   lamoTRIgine  (LAMICTAL ) 25 MG tablet One po every day x 5 days, then one po bid x 5 days, then one po tid x 5 days, then 2 po bid   losartan  (COZAAR ) 25 MG tablet TAKE 1 TABLET BY MOUTH DAILY   metoprolol  succinate (TOPROL -XL) 25 MG 24 hr tablet Take 25 mg by mouth daily.   nitroGLYCERIN  (NITROSTAT ) 0.4 MG SL tablet Place 1 tablet (0.4 mg total) under the tongue every 5 (five) minutes as needed for chest pain.   pantoprazole  (PROTONIX ) 40 MG tablet Take 1 tablet (40 mg total) by mouth daily.   rosuvastatin  (CRESTOR ) 10 MG tablet Take 1 tablet (10 mg total) by mouth daily.   VITAMIN D PO Take 1,000 Units by mouth daily.     Allergies:   Lodine [etodolac]   Social History   Socioeconomic History   Marital status: Married    Spouse name: Not on file   Number of children: Not on file   Years of education: Not on file   Highest education level: Not on file  Occupational History   Not on file  Tobacco Use   Smoking status: Never   Smokeless tobacco: Never  Substance and Sexual Activity   Alcohol use: Yes    Comment: Beer- socially   Drug use: No   Sexual activity: Not on file  Other Topics Concern   Not on file  Social History Narrative   Right handed   Caffeine use: Diet coke daily   Lives with dogs   Social Drivers of Health   Financial Resource Strain: Not on file  Food Insecurity: Not on file  Transportation Needs: Not on file   Physical Activity: Not on file  Stress: Not on file  Social Connections: Unknown (03/14/2022)   Received from [facility]   Social Network    Social Network: Not on file     Family History: The patient's family history includes Heart Problems in his brother, father, mother, and sister; Heart attack  in his father.  ROS:   Please see the history of present illness.    + bilateral leg swelling All other systems reviewed and are negative.  Labs/Other Studies Reviewed:    The following studies were reviewed today:  Echocardiogram 06/17/2020 EF 40-45, anterior/anterolateral HK, moderate asymmetric LVH, GR 1 DD, normal RVSF, RVSP 23.2   Carotid US  06/17/2020 Bilateral ICA 1-39   Cardiac catheterization 06/16/2020 Severe disease in the sequential saphenous vein graft to the diagonal and obtuse marginal. The continuation to the obtuse marginal is totally occluded. Proximal to mid vessel contains shaggy 99% regions of stenosis. The marginal fills by collaterals. Severe disease in the sequential saphenous vein graft to the PDA and second left ventricular branch with occlusion of the distal anastomosis to the left ventricular branch. The ostial to proximal graft contains 75% stenosis. The insertion site into the PDA is 70% narrowed. The proximal and distal PDA at the insertion site contains high-grade stenosis. Second and third left ventricular branches fill by collaterals. Patent LIMA to LAD. Left main is patent Proximal LAD is totally occluded Circumflex is ostially occluded Native RCA is distally occluded Anterior wall moderate hypokinesis. EF 40 to 50%. LVEDP is normal.  CABG x 3: He was taken to the operating room on 06/18/2020. He underwent Redo Sternotomy, CABG x 3 utilizing LIMA, Sequential SVG to Diagonal and OM, and RIMA to distal Right Vein Graft. He also underwent endoscopic harvest of greater saphenous vein harvest from his left leg.    Myoview  03/14/24  Study  Highlights      Findings are consistent with no ischemia. The study is low risk.   Down sloping ST depression in the inferolateral leads (II, III, aVF, V5 and V6) was noted in early recovery. Uninterpretable ECG during stress.   LV perfusion is equivocal. There is no evidence of ischemia. There is no evidence of infarction.   Left ventricular function is normal. Nuclear stress EF: 74%. The left ventricular ejection fraction is hyperdynamic (>65%). End diastolic cavity size is normal. End systolic cavity size is normal.   Coronary calcium  assessment not performed due to prior revascularization.   Prior study available for comparison from 11/09/2018. There are changes compared to prior study.   Rest images with mildly decreased perfusion inferiorly, likely due to artifact from extracardiac activity. Normal upright stress images, with normal perfusion and normal wall motion.   Physical Exam:    VS:  BP (!) 110/55 (BP Location: Right Arm, Patient Position: Sitting, Cuff Size: Normal)   Pulse 60   Ht 5\' 11"  (1.803 m)   Wt 154 lb 6.4 oz (70 kg)   SpO2 98%   BMI 21.53 kg/m     Wt Readings from Last 3 Encounters:  04/06/24 154 lb 6.4 oz (70 kg)  03/20/24 156 lb 8 oz (71 kg)  03/01/24 155 lb 6.4 oz (70.5 kg)     Affect appropriate Healthy:  appears stated age HEENT: normal Neck supple with no adenopathy JVP normal no bruits no thyromegaly Lungs clear with no wheezing and good diaphragmatic motion Heart:  S1/S2 no murmur, no rub, gallop or click PMI normal Prior sternotomy x 2  Abdomen: benighn, BS positve, no tenderness, no AAA no bruit.  No HSM or HJR Distal pulses intact with no bruits No edema Neuro non-focal Skin warm and dry No muscular weakness   EKG:   sinus bradycardia, TWI lead II, no acute change from previous   Assessment and Plan:  CAD s/p CABG 2003 with redo CABG 2021/History of MI: He is limited by back and leg pain but is active restoring cars and large  equipment. He denies angina. Normal myovue 03/2024 c No significant bleeding problems on DAPT. Continue asa, Plavix , metoprolol , Zetia , statin.   Chronic combined CHF/Ischemic cardiomyopathy:  LVEF normalized by TTE done 02/01/22 as well as myovue 03/14/24 .   Hyperlipidemia LDL goal < 70:  LDL 133 03/01/24  was not taking statin Now on crestor  10 mg labs in 3 months   Claudication: more likely pain from back and neuropathy will update ABI's   Essential hypertension: BP well-controlled today. He reports no recent concerns with BP readings per PCP. As noted above, will add spironolactone  12.5 mg daily. Advised patient to monitor BP and report consistent SBP < 100 mmHg or signs of lightheadedness, dizziness, or other concerns.  MS:  Has seen neurology and had two courses of [place]clad in 2023/2024 Initially diagnosed 1999. Some progression of gait/vision issues   Lipid/Liver  F/U in a year     Signed, Gregory Mediate, MD  04/06/2024 8:47 AM    [facility]

## 2024-03-27 NOTE — Telephone Encounter (Signed)
 [facility] is denying the MRI stating the patient needs to either complete steroid injections or 6 weeks of provider directed treatment in the last three months. He is currently scheduled at GI on June 7, if you would like to do a peer to peer please call [phone] opt. 4 case #[id number]. thanks

## 2024-03-27 NOTE — Telephone Encounter (Signed)
 Dr. Godwin Lat- how do you want to proceed with this?

## 2024-03-28 LAB — MYOCARDIAL PERFUSION IMAGING
Angina Index: 0
Estimated workload: 9.1
Exercise duration (min): 7 min
Exercise duration (sec): 24 s
LV dias vol: 77 mL (ref 62–150)
LV sys vol: 20 mL
MPHR: 158 {beats}/min
Nuc Stress EF: 74 %
Peak HR: 139 {beats}/min
Percent HR: 87 %
Rest HR: 60 {beats}/min
Rest Nuclear Isotope Dose: 10.9 mCi
SDS: 2
SRS: 0
SSS: 2
Stress Nuclear Isotope Dose: 31.7 mCi
TID: 1.02

## 2024-03-28 MED ORDER — ROSUVASTATIN CALCIUM 40 MG PO TABS
40.0000 mg | ORAL_TABLET | Freq: Every day | ORAL | 3 refills | Status: DC
Start: 2024-03-28 — End: 2024-03-29

## 2024-03-28 NOTE — Telephone Encounter (Signed)
-----   Message from Janelle Mediate sent at 03/28/2024 10:12 AM EDT ----- If he is taking his meds increase crestor  to 40 mg and f/u labs in 3 months ----- Message ----- From: Antonetta Kitchen, RN Sent: 03/28/2024   8:45 AM EDT To: Loyde Rule, MD  You are seeing patient on 04/06/24. Do you want discuss this at that time? ----- Message ----- From: Shira Dopp, RN Sent: 03/15/2024   2:16 PM EDT To: Antonetta Kitchen, RN  Dr Francie Irani patient.  He is seeing Dr Stann Earnest on 6/6 ----- Message ----- From: Knox Perl, MD Sent: 03/01/2024  10:18 PM EDT To: Shira Dopp, RN  His LDL is not at goal, has increased compared to a year ago, do not know whether he is taking his medications or not.  We will discuss further management on his next office visit.

## 2024-03-28 NOTE — Telephone Encounter (Signed)
 Placed orders for Crestor  40 mg and lab work.

## 2024-03-29 MED ORDER — ROSUVASTATIN CALCIUM 10 MG PO TABS: 10.0000 mg | ORAL_TABLET | Freq: Every day | ORAL | 3 refills | Status: AC

## 2024-03-29 NOTE — Telephone Encounter (Signed)
 Patient following up. He says yesterday he accidentally told the nurse he has already been taking Crestor  20 MG. He would like to clarify that he actually has not been taking Crestor  at all and may need a lower dose. Please advise.

## 2024-03-29 NOTE — Addendum Note (Signed)
 Addended by: Reyes Caul, Luann Aspinwall L on: 03/29/2024 12:31 PM   Modules accepted: Orders

## 2024-04-02 NOTE — Telephone Encounter (Signed)
 Called pt. Relayed Dr. Thom Fleeting message. He verbalized understanding. He does not want to do another ESI. He will try PTx6 wk. He will call insurance to find out who is in network and closer to home and call back w/ info.  He was under impression MRI lumbar to check for MS changes. I relayed it was also for lumbar radiculopathy.   He will call [facility] [facility] in the morning to cx upcoming MRI since it is not approved at [phone]

## 2024-04-03 NOTE — Progress Notes (Signed)
 Sure 10 mg Crestor  daily. Also let him know that the stress test is normal and good exercise tolerance

## 2024-04-05 NOTE — Telephone Encounter (Signed)
 Patient called, In order for insurance pay for MRI must do physical therapy or epidural. Asking what to do about schedule physical therapy appt. Informed patient neurology would need to do a referral and facility will call to schedule appt. Patient said [facility] takes my insurance. Would like a call back.

## 2024-04-06 ENCOUNTER — Ambulatory Visit: Attending: Cardiovascular Disease | Admitting: Cardiovascular Disease

## 2024-04-06 VITALS — BP 110/55 | HR 60 | Ht 71.0 in | Wt 154.4 lb

## 2024-04-06 DIAGNOSIS — Z951 Presence of aortocoronary bypass graft: Secondary | ICD-10-CM | POA: Diagnosis not present

## 2024-04-06 DIAGNOSIS — I25118 Atherosclerotic heart disease of native coronary artery with other forms of angina pectoris: Secondary | ICD-10-CM

## 2024-04-06 DIAGNOSIS — E782 Mixed hyperlipidemia: Secondary | ICD-10-CM | POA: Diagnosis not present

## 2024-04-06 NOTE — Patient Instructions (Signed)
 Medication Instructions:  Your physician recommends that you continue on your current medications as directed. Please refer to the Current Medication list given to you today.  *If you need a refill on your cardiac medications before your next appointment, please call your pharmacy*  Follow-Up: At [facility], you and your health needs are our priority.  As part of our continuing mission to provide you with exceptional heart care, our providers are all part of one team.  This team includes your primary Cardiologist (physician) and Advanced Practice Providers or APPs (Physician Assistants and Nurse Practitioners) who all work together to provide you with the care you need, when you need it.  Your next appointment:   6 month(s)  Provider:   Janelle Mediate, MD  We recommend signing up for the patient portal called "MyChart".  Sign up information is provided on this After Visit Summary.  MyChart is used to connect with patients for Virtual Visits (Telemedicine).  Patients are able to view lab/test results, encounter notes, upcoming appointments, etc.  Non-urgent messages can be sent to your provider as well.   To learn more about what you can do with MyChart, go to [email or web address].

## 2024-04-07 ENCOUNTER — Other Ambulatory Visit

## 2024-04-09 ENCOUNTER — Telehealth: Payer: Self-pay | Admitting: Neurology

## 2024-04-09 NOTE — Telephone Encounter (Signed)
 Referral for Physical Therapy  faxed to [facility] [place]   [facility] [place] Phone: [phone] Fax: [phone]

## 2024-04-09 NOTE — Telephone Encounter (Signed)
 Referral placed at per pt request.   [facility] [place] [address] [place], [place] [address] Phone: [phone] Fax: [phone]

## 2024-04-09 NOTE — Addendum Note (Signed)
 Addended by: Fredi January on: 04/09/2024 02:06 PM   Modules accepted: Orders

## 2024-04-13 ENCOUNTER — Telehealth: Payer: Self-pay | Admitting: *Deleted

## 2024-04-13 ENCOUNTER — Ambulatory Visit: Admitting: Plastic Surgery

## 2024-04-13 ENCOUNTER — Encounter: Payer: Self-pay | Admitting: Plastic Surgery

## 2024-04-13 VITALS — BP 134/77 | HR 56 | Ht 71.0 in | Wt 153.6 lb

## 2024-04-13 DIAGNOSIS — D0439 Carcinoma in situ of skin of other parts of face: Secondary | ICD-10-CM | POA: Diagnosis not present

## 2024-04-13 DIAGNOSIS — D049 Carcinoma in situ of skin, unspecified: Secondary | ICD-10-CM

## 2024-04-13 NOTE — Progress Notes (Signed)
 Referring Provider Bertrum Brodie, MD [address] [place],  [place] [address]   CC:  Chief Complaint  Patient presents with   Consult            Gregory Gardner is an 63 y.o. male.  HPI: Gregory Gardner is a 63 year old male who is referred from his dermatologist, Dr. 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  [name] had an attempted excision of the carcinoma in the office.  He was unable to tolerate this due to claustrophobia and he is referred for excision of the lesion in the operating room.  He also notes that he has a new skin lesion on the right temple area that he would like to have biopsied at the same time.  Gregory Gardner has significant cardiac issues including 2 bypass surgeries the last 1 approximately 5 years ago.  He is on multiple cardiac medications and Plavix .  Allergies  Allergen Reactions   Lodine [Etodolac] Nausea Only    Outpatient Encounter Medications as of 04/13/2024  Medication Sig Note   Albuterol  Sulfate (VENTOLIN  HFA IN) Inhale 1 puff into the lungs daily as needed (for wheezing).     alendronate (FOSAMAX) 70 MG tablet Take 70 mg by mouth once a week. Take with a full glass of water on an empty stomach. 06/16/2020: Take on Thursdays   ALPRAZolam  (XANAX ) 0.5 MG tablet Take 2-3 pills before MRI.  Must have a driver    Aspirin -Salicylamide-Caffeine (BC HEADACHE POWDER PO) Take by mouth as needed.    Cladribine, 7 Tabs, (MAVENCLAD, 7 TABS,) 10 MG TBPK Take by mouth. Month 1: 20mg  on day 1-2. 10mg  on day 3-5. Month 2: Same as month one. (Patient not taking: Reported on 04/06/2024)    clopidogrel  (PLAVIX ) 75 MG tablet Take 1 tablet (75 mg total) by mouth daily.    ezetimibe  (ZETIA ) 10 MG tablet Take 10 mg by mouth daily.    fenofibrate  micronized (LOFIBRA) 134 MG capsule Take 134 mg by mouth daily.    lamoTRIgine  (LAMICTAL ) 25 MG tablet One po every day x 5 days, then one po bid x 5 days, then one po tid x 5 days, then 2 po bid    losartan   (COZAAR ) 25 MG tablet TAKE 1 TABLET BY MOUTH DAILY    metoprolol  succinate (TOPROL -XL) 25 MG 24 hr tablet Take 25 mg by mouth daily.    nitroGLYCERIN  (NITROSTAT ) 0.4 MG SL tablet Place 1 tablet (0.4 mg total) under the tongue every 5 (five) minutes as needed for chest pain.    pantoprazole  (PROTONIX ) 40 MG tablet Take 1 tablet (40 mg total) by mouth daily.    pregabalin  (LYRICA ) 150 MG capsule TAKE 1 CAPSULE BY MOUTH AT BEDTIME.    rosuvastatin  (CRESTOR ) 10 MG tablet Take 1 tablet (10 mg total) by mouth daily.    VITAMIN D PO Take 1,000 Units by mouth daily.    No facility-administered encounter medications on file as of 04/13/2024.     Past Medical History:  Diagnosis Date   Abnormal CBC    Actinic keratoses    Asthma    Bladder neoplasm    BMI 21.0-21.9, adult    BPH (benign prostatic hyperplasia)    CAD (coronary artery disease)    s/p Inf STEMI >> S/p CABG in 2003 // Myoview  11/2018: low risk // s/p NSTEMI 8/21 >> s/p redo CABG   Chronic constipation    Chronic lumbar radiculopathy    Chronic pain of right  elbow    CKD (chronic kidney disease)    stage 3   Colon polyp    Colonic obstruction (HCC)    DM (diabetes mellitus) (HCC)    ED (erectile dysfunction)    Erysipelas    Fatigue    GERD (gastroesophageal reflux disease)    Hepatic steatosis    HTN (hypertension)    Hypercholesterolemia    Hypomagnesemia    Impacted cerumen, bilateral    Ischemic cardiomyopathy    Echo 8/21: EF 40-45, anterior/anterolateral HK, moderate asymmetric LVH, GR 1 DD, normal RVSF, RVSP 23.2   Lipids abnormal    Low back pain    Lumbar spinal stenosis    Lymph node enlargement    Mixed hyperlipidemia    Intol of statins (?elevated LFTs)   MRSA (methicillin resistant Staphylococcus aureus)    Multiple sclerosis (HCC)    Multiple sclerosis (HCC)    Neuropathy    Nonalcoholic fatty liver disease    Osteoporosis    Seasonal allergies    Seborrheic dermatitis of scalp    Skin lesion     SOB (shortness of breath)    Tinea corporis    Tinea cruris    Urinary bladder neoplasm    Vitamin D deficiency    Weight loss     Past Surgical History:  Procedure Laterality Date   CORONARY ARTERY BYPASS GRAFT N/A 06/18/2020   Procedure: REDO CORONARY ARTERY BYPASS GRAFTING (CABG), ON PUMP, TIMES THREE, USING RIGHT INTERNAL MAMMARY ARTERY AND ENDOSCOPICALLY HARVESTED LEFT GREATER SAPHENOUS VEIN;  Surgeon: Norita Beauvais, MD;  Location: [facility] OR;  Service: Open Heart Surgery;  Laterality: N/A;  SEQENTIAL DIAG TO OM RIMA TO PREVIOUS DISTAL RIGHT VEIN GRAFT   ENDOVEIN HARVEST OF GREATER SAPHENOUS VEIN Left 06/18/2020   Procedure: ENDOVEIN HARVEST OF GREATER SAPHENOUS VEIN;  Surgeon: Norita Beauvais, MD;  Location: [facility] OR;  Service: Open Heart Surgery;  Laterality: Left;   LEFT HEART CATH AND CORS/GRAFTS ANGIOGRAPHY N/A 06/16/2020   Procedure: LEFT HEART CATH AND CORS/GRAFTS ANGIOGRAPHY;  Surgeon: Arty Binning, MD;  Location: [facility] INVASIVE CV LAB;  Service: Cardiovascular;  Laterality: N/A;   MANDIBLE SURGERY     TEE WITHOUT CARDIOVERSION N/A 06/18/2020   Procedure: TRANSESOPHAGEAL ECHOCARDIOGRAM (TEE);  Surgeon: Norita Beauvais, MD;  Location: [facility] OR;  Service: Open Heart Surgery;  Laterality: N/A;    Family History  Problem Relation Age of Onset   Heart Problems Mother    Heart Problems Father    Heart attack Father    Heart Problems Sister    Heart Problems Brother     Social History   Social History Narrative   Right handed   Caffeine use: Diet coke daily   Lives with dogs     Review of Systems General: Denies fevers, chills, weight loss CV: Denies chest pain, shortness of breath, palpitations Skin: 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  Physical Exam    04/13/2024    1:45 PM 04/06/2024    8:11 AM 03/20/2024    2:54 PM  Vitals with BMI  Height 5' 11 5' 11 5' 11  Weight 153 lbs 10 oz 154 lbs 6 oz 156 lbs 8 oz  BMI 21.43  21.54 21.84  Systolic 134 110 657  Diastolic 77 55 68  Pulse 56 60 44    General:  No acute distress,  Alert and oriented, Non-Toxic, Normal speech and affect Integument: Patient has a scar from a  previous biopsy on the left temple.  There is a partially healed wound on the left temple.  There is a raised lesion on the right temple as well.   Assessment/Plan Squamous cell carcinoma, left temple: Patient will need reexcision of the lesion.  He understands that I cannot perform Mohs surgery and because of this if there are positive margins he will require additional procedures.  I have agreed to excise the lesion on his right temple as well.  He understands there will be a scar.  Will obtain cardiac clearance and see if there is a possibility of stopping the Plavix  for 3 days prior to the procedure and the day after the procedure.  The risks of bleeding and infection were discussed.  Will submit him for excision of the skin lesion to be done as soon as possible.  Teretha Ferguson 04/13/2024, 3:15 PM

## 2024-04-13 NOTE — Telephone Encounter (Signed)
   Pre-operative Risk Assessment    Patient Name: Gregory Gardner  DOB: October 31, 1961 MRN: [id number]   Date of last office visit: 04/06/24 DR. NISHAN Date of next office visit: NONE   Request for Surgical Clearance    Procedure:  EXCISION OF SKIN CANCERS  Date of Surgery:  Clearance TBD (ASAP)                               Surgeon:  DR. Larraine Plush Surgeon's Group or Practice Name:  [facility]  Phone number:  [phone] Fax number:  [phone]   Type of Clearance Requested:   - Medical  - Pharmacy:  Hold Clopidogrel  (Plavix ) x 3 DAYS PRIOR AND 1 DAY POST OP    Type of Anesthesia:  General    Additional requests/questions:    [name]   04/13/2024, 4:14 PM

## 2024-04-16 NOTE — Telephone Encounter (Signed)
 Dr. Stann Earnest,  You saw this patient on 6/6. Per office protocol, will you please comment on medical clearance for excision of skin cancers?  Please route your response to P CV DIV Preop. I will communicate with requesting office once you have given recommendations.   Thank you!  Morey Ar, NP

## 2024-04-17 NOTE — Telephone Encounter (Signed)
   Name: Gregory Gardner  DOB: August 10, 1961  MRN: [id number]   Primary Cardiologist: Janelle Mediate, MD  Chart reviewed as part of pre-operative protocol coverage. Gregory Gardner was last seen on 04/06/2024 by Dr. Stann Earnest.  Per Dr. Constance Dellen to proceed with skin cancer removal Can hold plavix  5 days before if necessary.  Therefore, based on ACC/AHA guidelines, the patient would be an acceptable risk for the planned procedure without further cardiovascular testing.   Per office protocol, he may hold Plavix  for 5 days prior to procedure and should resume as soon as hemodynamically stable postoperatively.   I will route this recommendation to the requesting party via Epic fax function and remove from pre-op pool. Please call with questions.  Morey Ar, NP 04/17/2024, 8:37 AM

## 2024-05-09 ENCOUNTER — Ambulatory Visit: Admitting: Student

## 2024-05-09 VITALS — BP 128/82 | HR 55 | Ht 71.0 in | Wt 147.6 lb

## 2024-05-09 DIAGNOSIS — D049 Carcinoma in situ of skin, unspecified: Secondary | ICD-10-CM

## 2024-05-09 MED ORDER — HYDROCODONE-ACETAMINOPHEN 5-325 MG PO TABS: 1.0000 | ORAL_TABLET | Freq: Four times a day (QID) | ORAL | 0 refills | Status: DC | PRN

## 2024-05-09 MED ORDER — ONDANSETRON HCL 4 MG PO TABS: 4.0000 mg | ORAL_TABLET | Freq: Three times a day (TID) | ORAL | 0 refills | Status: DC | PRN

## 2024-05-09 NOTE — Progress Notes (Signed)
 Patient ID: Gregory Gardner, male    DOB: December 12, 1960, 63 y.o.   MRN: [id number]  Chief Complaint  Patient presents with   Pre-op Exam      ICD-10-CM   1. Squamous cell carcinoma in situ (SCCIS) of skin  D04.9        History of Present Illness: Gregory Gardner is a 63 y.o.  male  with a history of squamous cell carcinoma.  He presents for preoperative evaluation for upcoming procedure, excision of bilateral temple skin lesions, scheduled for 05/28/2024 with Dr. Waddell.  The patient has not had problems with anesthesia.  Patient does have history of hypertension, NSTEMI and CABG.  He currently is on Plavix .  We discussed clearance from his cardiologist and holding Plavix  5 days prior to surgery.  He expressed understanding.  Patient reports he is not a smoker.  Patient denies taking any hormone replacement.  Patient denies any personal or family history of blood clots or clotting diseases.  He denies any recent surgeries, traumas or infections.  Patient denies any history of stroke.  He does report history of heart attack.  He denies any history of Crohn's disease or ulcerative colitis.  He does report he has asthma, but it is well-controlled.  He denies any history of cancer other than the squamous cell carcinoma.  He denies any varicosities to his lower extremities.  He denies any recent fevers, chills or changes in his health.   Summary of Previous Visit: Patient was seen by Dr. Waddell on 04/13/2024.  At this visit, patient had been referred by dermatology for excision of left temporal squamous cell carcinoma.  Patient had had an attempted excision of the carcinoma in office but was unable to tolerate it.  Patient also noted a new skin lesion to the right temple area and wanted it to be biopsied at the same time.  Cardiac clearance was sent to see if patient will be cleared for surgery and if patient could hold his Plavix  before surgery.  Per chart review, note was placed by Barnie Hila, NP with heart care on 04/17/2024.  Per her note, Per Dr. Delford Belton to proceed with skin cancer removal Can hold plavix  5 days before if necessary..  Patient was found to be at an acceptable risk for the planned procedure without any further testing.  Patient may also hold Plavix  5 days prior to his procedure.  Job: Works for himself, planning to take 3 to 4 days off after surgery.  PMH Significant for: Hypertension, multiple sclerosis, history of CABG, NSTEMI, ACS, CAD   Past Medical History: Allergies: Allergies  Allergen Reactions   Lodine [Etodolac] Nausea Only    Current Medications:  Current Outpatient Medications:    Albuterol  Sulfate (VENTOLIN  HFA IN), Inhale 1 puff into the lungs daily as needed (for wheezing). , Disp: , Rfl:    alendronate (FOSAMAX) 70 MG tablet, Take 70 mg by mouth once a week. Take with a full glass of water on an empty stomach., Disp: , Rfl:    Aspirin -Salicylamide-Caffeine (BC HEADACHE POWDER PO), Take by mouth as needed., Disp: , Rfl:    clopidogrel  (PLAVIX ) 75 MG tablet, Take 1 tablet (75 mg total) by mouth daily., Disp: 90 tablet, Rfl: 3   ezetimibe  (ZETIA ) 10 MG tablet, Take 10 mg by mouth daily., Disp: , Rfl:    fenofibrate  micronized (LOFIBRA) 134 MG capsule, Take 134 mg by mouth daily., Disp: , Rfl:    lamoTRIgine  (LAMICTAL )  25 MG tablet, One po every day x 5 days, then one po bid x 5 days, then one po tid x 5 days, then 2 po bid, Disp: 120 tablet, Rfl: 5   losartan  (COZAAR ) 25 MG tablet, TAKE 1 TABLET BY MOUTH DAILY, Disp: 90 tablet, Rfl: 3   metoprolol  succinate (TOPROL -XL) 25 MG 24 hr tablet, Take 25 mg by mouth daily., Disp: , Rfl:    nitroGLYCERIN  (NITROSTAT ) 0.4 MG SL tablet, Place 1 tablet (0.4 mg total) under the tongue every 5 (five) minutes as needed for chest pain., Disp: 25 tablet, Rfl: 3   pantoprazole  (PROTONIX ) 40 MG tablet, Take 1 tablet (40 mg total) by mouth daily., Disp: 30 tablet, Rfl: 2   rosuvastatin  (CRESTOR ) 10 MG  tablet, Take 1 tablet (10 mg total) by mouth daily., Disp: 90 tablet, Rfl: 3   VITAMIN D PO, Take 1,000 Units by mouth daily., Disp: , Rfl:    ALPRAZolam  (XANAX ) 0.5 MG tablet, Take 2-3 pills before MRI.  Must have a driver (Patient not taking: Reported on 05/09/2024), Disp: 3 tablet, Rfl: 0   Cladribine, 7 Tabs, (MAVENCLAD, 7 TABS,) 10 MG TBPK, Take by mouth. Month 1: 20mg  on day 1-2. 10mg  on day 3-5. Month 2: Same as month one. (Patient not taking: Reported on 04/06/2024), Disp: , Rfl:    pregabalin  (LYRICA ) 150 MG capsule, TAKE 1 CAPSULE BY MOUTH AT BEDTIME., Disp: 30 capsule, Rfl: 2  Past Medical Problems: Past Medical History:  Diagnosis Date   Abnormal CBC    Actinic keratoses    Asthma    Bladder neoplasm    BMI 21.0-21.9, adult    BPH (benign prostatic hyperplasia)    CAD (coronary artery disease)    s/p Inf STEMI >> S/p CABG in 2003 // Myoview  11/2018: low risk // s/p NSTEMI 8/21 >> s/p redo CABG   Chronic constipation    Chronic lumbar radiculopathy    Chronic pain of right elbow    CKD (chronic kidney disease)    stage 3   Colon polyp    Colonic obstruction (HCC)    DM (diabetes mellitus) (HCC)    ED (erectile dysfunction)    Erysipelas    Fatigue    GERD (gastroesophageal reflux disease)    Hepatic steatosis    HTN (hypertension)    Hypercholesterolemia    Hypomagnesemia    Impacted cerumen, bilateral    Ischemic cardiomyopathy    Echo 8/21: EF 40-45, anterior/anterolateral HK, moderate asymmetric LVH, GR 1 DD, normal RVSF, RVSP 23.2   Lipids abnormal    Low back pain    Lumbar spinal stenosis    Lymph node enlargement    Mixed hyperlipidemia    Intol of statins (?elevated LFTs)   MRSA (methicillin resistant Staphylococcus aureus)    Multiple sclerosis (HCC)    Multiple sclerosis (HCC)    Neuropathy    Nonalcoholic fatty liver disease    Osteoporosis    Seasonal allergies    Seborrheic dermatitis of scalp    Skin lesion    SOB (shortness of breath)    Tinea  corporis    Tinea cruris    Urinary bladder neoplasm    Vitamin D deficiency    Weight loss     Past Surgical History: Past Surgical History:  Procedure Laterality Date   CORONARY ARTERY BYPASS GRAFT N/A 06/18/2020   Procedure: REDO CORONARY ARTERY BYPASS GRAFTING (CABG), ON PUMP, TIMES THREE, USING RIGHT INTERNAL MAMMARY ARTERY AND ENDOSCOPICALLY HARVESTED LEFT  GREATER SAPHENOUS VEIN;  Surgeon: Army Dallas NOVAK, MD;  Location: [facility] OR;  Service: Open Heart Surgery;  Laterality: N/A;  SEQENTIAL DIAG TO OM RIMA TO PREVIOUS DISTAL RIGHT VEIN GRAFT   ENDOVEIN HARVEST OF GREATER SAPHENOUS VEIN Left 06/18/2020   Procedure: ENDOVEIN HARVEST OF GREATER SAPHENOUS VEIN;  Surgeon: Army Dallas NOVAK, MD;  Location: [facility] OR;  Service: Open Heart Surgery;  Laterality: Left;   LEFT HEART CATH AND CORS/GRAFTS ANGIOGRAPHY N/A 06/16/2020   Procedure: LEFT HEART CATH AND CORS/GRAFTS ANGIOGRAPHY;  Surgeon: Claudene Victory ORN, MD;  Location: [facility];  Service: Cardiovascular;  Laterality: N/A;   MANDIBLE SURGERY     TEE WITHOUT CARDIOVERSION N/A 06/18/2020   Procedure: TRANSESOPHAGEAL ECHOCARDIOGRAM (TEE);  Surgeon: Army Dallas NOVAK, MD;  Location: [facility] OR;  Service: Open Heart Surgery;  Laterality: N/A;    Social History: Social History   Socioeconomic History   Marital status: Married    Spouse name: Not on file   Number of children: Not on file   Years of education: Not on file   Highest education level: Not on file  Occupational History   Not on file  Tobacco Use   Smoking status: Never   Smokeless tobacco: Never  Substance and Sexual Activity   Alcohol use: Yes    Comment: Beer- socially   Drug use: No   Sexual activity: Not on file  Other Topics Concern   Not on file  Social History Narrative   Right handed   Caffeine use: Diet coke daily   Lives with dogs   Social Drivers of Health   Financial Resource Strain: Not on file  Food Insecurity: Not on file  Transportation Needs:  Not on file  Physical Activity: Not on file  Stress: Not on file  Social Connections: Unknown (03/14/2022)   Received from [facility]   Social Network    Social Network: Not on file  Intimate Partner Violence: Unknown (02/04/2022)   Received from [facility]   HITS    Physically Hurt: Not on file    Insult or Talk Down To: Not on file    Threaten Physical Harm: Not on file    Scream or Curse: Not on file    Family History: Family History  Problem Relation Age of Onset   Heart Problems Mother    Heart Problems Father    Heart attack Father    Heart Problems Sister    Heart Problems Brother     Review of Systems: Denies any fevers, chills, or changes in health  Physical Exam: Vital Signs BP 128/82 (BP Location: Left Arm, Patient Position: Sitting, Cuff Size: Normal)   Pulse (!) 55   Ht 5' 11 (1.803 m)   Wt 147 lb 9.6 oz (67 kg)   SpO2 98%   BMI 20.59 kg/m   Physical Exam  Constitutional:      General: Not in acute distress.    Appearance: Normal appearance. Not ill-appearing.  HENT:     Head: Normocephalic and atraumatic.  Neck:     Musculoskeletal: Normal range of motion.  Cardiovascular:     Rate and Rhythm: Normal rate Pulmonary:     Effort: Pulmonary effort is normal. No respiratory distress.  Musculoskeletal: Normal range of motion.  Skin:    General: Skin is warm and dry.     Findings: No erythema or rash.  Neurological:     Mental Status: Alert and oriented to person, place, and time. Mental status  is at baseline.  Psychiatric:        Mood and Affect: Mood normal.        Behavior: Behavior normal.    Assessment/Plan: The patient is scheduled for excision of bilateral temple skin lesions with Dr. Waddell.  Risks, benefits, and alternatives of procedure discussed, questions answered and consent obtained.    Smoking Status: Nonsmoker; Counseling Given? N/A  Caprini Score: 4; Risk Factors include: Age and length of planned surgery. Recommendation  for mechanical prophylaxis. Encourage early ambulation.   Pictures obtained: @consult   Post-op Rx sent to pharmacy: Norco, Zofran   Discussed with the patient to hold his Plavix  5 days prior to surgery.  Instructed him to hold his aspirin -salicylate 9-caffeine at least 1 week prior to surgery.  Discussed with patient to hold his alendronate the day of surgery.  Instructed him to hold his Zetia  the day before surgery.  Instructed him to hold any multivitamins or supplements at least 1 week prior to surgery.  Patient expressed understanding.  Patient was provided with the General Surgical Risk consent document and Pain Medication Agreement prior to their appointment.  They had adequate time to read through the risk consent documents and Pain Medication Agreement. We also discussed them in person together during this preop appointment. All of their questions were answered to their satisfaction.  Recommended calling if they have any further questions.  Risk consent form and Pain Medication Agreement to be scanned into patient's chart.  The consent was obtained with risks and complications reviewed which included bleeding, pain, scar, infection and the risk of anesthesia.  The patients questions were answered to the patients expressed satisfaction.    Electronically signed by: Estefana FORBES Peck, PA-C 05/09/2024 1:06 PM

## 2024-05-09 NOTE — H&P (View-Only) (Signed)
 Patient ID: Gregory Gardner, male    DOB: December 12, 1960, 63 y.o.   MRN: [id number]  Chief Complaint  Patient presents with   Pre-op Exam      ICD-10-CM   1. Squamous cell carcinoma in situ (SCCIS) of skin  D04.9        History of Present Illness: Gregory Gardner is a 63 y.o.  male  with a history of squamous cell carcinoma.  He presents for preoperative evaluation for upcoming procedure, excision of bilateral temple skin lesions, scheduled for 05/28/2024 with Dr. Waddell.  The patient has not had problems with anesthesia.  Patient does have history of hypertension, NSTEMI and CABG.  He currently is on Plavix .  We discussed clearance from his cardiologist and holding Plavix  5 days prior to surgery.  He expressed understanding.  Patient reports he is not a smoker.  Patient denies taking any hormone replacement.  Patient denies any personal or family history of blood clots or clotting diseases.  He denies any recent surgeries, traumas or infections.  Patient denies any history of stroke.  He does report history of heart attack.  He denies any history of Crohn's disease or ulcerative colitis.  He does report he has asthma, but it is well-controlled.  He denies any history of cancer other than the squamous cell carcinoma.  He denies any varicosities to his lower extremities.  He denies any recent fevers, chills or changes in his health.   Summary of Previous Visit: Patient was seen by Dr. Waddell on 04/13/2024.  At this visit, patient had been referred by dermatology for excision of left temporal squamous cell carcinoma.  Patient had had an attempted excision of the carcinoma in office but was unable to tolerate it.  Patient also noted a new skin lesion to the right temple area and wanted it to be biopsied at the same time.  Cardiac clearance was sent to see if patient will be cleared for surgery and if patient could hold his Plavix  before surgery.  Per chart review, note was placed by Barnie Hila, NP with heart care on 04/17/2024.  Per her note, Per Dr. Delford Belton to proceed with skin cancer removal Can hold plavix  5 days before if necessary..  Patient was found to be at an acceptable risk for the planned procedure without any further testing.  Patient may also hold Plavix  5 days prior to his procedure.  Job: Works for himself, planning to take 3 to 4 days off after surgery.  PMH Significant for: Hypertension, multiple sclerosis, history of CABG, NSTEMI, ACS, CAD   Past Medical History: Allergies: Allergies  Allergen Reactions   Lodine [Etodolac] Nausea Only    Current Medications:  Current Outpatient Medications:    Albuterol  Sulfate (VENTOLIN  HFA IN), Inhale 1 puff into the lungs daily as needed (for wheezing). , Disp: , Rfl:    alendronate (FOSAMAX) 70 MG tablet, Take 70 mg by mouth once a week. Take with a full glass of water on an empty stomach., Disp: , Rfl:    Aspirin -Salicylamide-Caffeine (BC HEADACHE POWDER PO), Take by mouth as needed., Disp: , Rfl:    clopidogrel  (PLAVIX ) 75 MG tablet, Take 1 tablet (75 mg total) by mouth daily., Disp: 90 tablet, Rfl: 3   ezetimibe  (ZETIA ) 10 MG tablet, Take 10 mg by mouth daily., Disp: , Rfl:    fenofibrate  micronized (LOFIBRA) 134 MG capsule, Take 134 mg by mouth daily., Disp: , Rfl:    lamoTRIgine  (LAMICTAL )  25 MG tablet, One po every day x 5 days, then one po bid x 5 days, then one po tid x 5 days, then 2 po bid, Disp: 120 tablet, Rfl: 5   losartan  (COZAAR ) 25 MG tablet, TAKE 1 TABLET BY MOUTH DAILY, Disp: 90 tablet, Rfl: 3   metoprolol  succinate (TOPROL -XL) 25 MG 24 hr tablet, Take 25 mg by mouth daily., Disp: , Rfl:    nitroGLYCERIN  (NITROSTAT ) 0.4 MG SL tablet, Place 1 tablet (0.4 mg total) under the tongue every 5 (five) minutes as needed for chest pain., Disp: 25 tablet, Rfl: 3   pantoprazole  (PROTONIX ) 40 MG tablet, Take 1 tablet (40 mg total) by mouth daily., Disp: 30 tablet, Rfl: 2   rosuvastatin  (CRESTOR ) 10 MG  tablet, Take 1 tablet (10 mg total) by mouth daily., Disp: 90 tablet, Rfl: 3   VITAMIN D PO, Take 1,000 Units by mouth daily., Disp: , Rfl:    ALPRAZolam  (XANAX ) 0.5 MG tablet, Take 2-3 pills before MRI.  Must have a driver (Patient not taking: Reported on 05/09/2024), Disp: 3 tablet, Rfl: 0   Cladribine, 7 Tabs, (MAVENCLAD, 7 TABS,) 10 MG TBPK, Take by mouth. Month 1: 20mg  on day 1-2. 10mg  on day 3-5. Month 2: Same as month one. (Patient not taking: Reported on 04/06/2024), Disp: , Rfl:    pregabalin  (LYRICA ) 150 MG capsule, TAKE 1 CAPSULE BY MOUTH AT BEDTIME., Disp: 30 capsule, Rfl: 2  Past Medical Problems: Past Medical History:  Diagnosis Date   Abnormal CBC    Actinic keratoses    Asthma    Bladder neoplasm    BMI 21.0-21.9, adult    BPH (benign prostatic hyperplasia)    CAD (coronary artery disease)    s/p Inf STEMI >> S/p CABG in 2003 // Myoview  11/2018: low risk // s/p NSTEMI 8/21 >> s/p redo CABG   Chronic constipation    Chronic lumbar radiculopathy    Chronic pain of right elbow    CKD (chronic kidney disease)    stage 3   Colon polyp    Colonic obstruction (HCC)    DM (diabetes mellitus) (HCC)    ED (erectile dysfunction)    Erysipelas    Fatigue    GERD (gastroesophageal reflux disease)    Hepatic steatosis    HTN (hypertension)    Hypercholesterolemia    Hypomagnesemia    Impacted cerumen, bilateral    Ischemic cardiomyopathy    Echo 8/21: EF 40-45, anterior/anterolateral HK, moderate asymmetric LVH, GR 1 DD, normal RVSF, RVSP 23.2   Lipids abnormal    Low back pain    Lumbar spinal stenosis    Lymph node enlargement    Mixed hyperlipidemia    Intol of statins (?elevated LFTs)   MRSA (methicillin resistant Staphylococcus aureus)    Multiple sclerosis (HCC)    Multiple sclerosis (HCC)    Neuropathy    Nonalcoholic fatty liver disease    Osteoporosis    Seasonal allergies    Seborrheic dermatitis of scalp    Skin lesion    SOB (shortness of breath)    Tinea  corporis    Tinea cruris    Urinary bladder neoplasm    Vitamin D deficiency    Weight loss     Past Surgical History: Past Surgical History:  Procedure Laterality Date   CORONARY ARTERY BYPASS GRAFT N/A 06/18/2020   Procedure: REDO CORONARY ARTERY BYPASS GRAFTING (CABG), ON PUMP, TIMES THREE, USING RIGHT INTERNAL MAMMARY ARTERY AND ENDOSCOPICALLY HARVESTED LEFT  GREATER SAPHENOUS VEIN;  Surgeon: Army Dallas NOVAK, MD;  Location: [facility] OR;  Service: Open Heart Surgery;  Laterality: N/A;  SEQENTIAL DIAG TO OM RIMA TO PREVIOUS DISTAL RIGHT VEIN GRAFT   ENDOVEIN HARVEST OF GREATER SAPHENOUS VEIN Left 06/18/2020   Procedure: ENDOVEIN HARVEST OF GREATER SAPHENOUS VEIN;  Surgeon: Army Dallas NOVAK, MD;  Location: [facility] OR;  Service: Open Heart Surgery;  Laterality: Left;   LEFT HEART CATH AND CORS/GRAFTS ANGIOGRAPHY N/A 06/16/2020   Procedure: LEFT HEART CATH AND CORS/GRAFTS ANGIOGRAPHY;  Surgeon: Claudene Victory ORN, MD;  Location: [facility];  Service: Cardiovascular;  Laterality: N/A;   MANDIBLE SURGERY     TEE WITHOUT CARDIOVERSION N/A 06/18/2020   Procedure: TRANSESOPHAGEAL ECHOCARDIOGRAM (TEE);  Surgeon: Army Dallas NOVAK, MD;  Location: [facility] OR;  Service: Open Heart Surgery;  Laterality: N/A;    Social History: Social History   Socioeconomic History   Marital status: Married    Spouse name: Not on file   Number of children: Not on file   Years of education: Not on file   Highest education level: Not on file  Occupational History   Not on file  Tobacco Use   Smoking status: Never   Smokeless tobacco: Never  Substance and Sexual Activity   Alcohol use: Yes    Comment: Beer- socially   Drug use: No   Sexual activity: Not on file  Other Topics Concern   Not on file  Social History Narrative   Right handed   Caffeine use: Diet coke daily   Lives with dogs   Social Drivers of Health   Financial Resource Strain: Not on file  Food Insecurity: Not on file  Transportation Needs:  Not on file  Physical Activity: Not on file  Stress: Not on file  Social Connections: Unknown (03/14/2022)   Received from [facility]   Social Network    Social Network: Not on file  Intimate Partner Violence: Unknown (02/04/2022)   Received from [facility]   HITS    Physically Hurt: Not on file    Insult or Talk Down To: Not on file    Threaten Physical Harm: Not on file    Scream or Curse: Not on file    Family History: Family History  Problem Relation Age of Onset   Heart Problems Mother    Heart Problems Father    Heart attack Father    Heart Problems Sister    Heart Problems Brother     Review of Systems: Denies any fevers, chills, or changes in health  Physical Exam: Vital Signs BP 128/82 (BP Location: Left Arm, Patient Position: Sitting, Cuff Size: Normal)   Pulse (!) 55   Ht 5' 11 (1.803 m)   Wt 147 lb 9.6 oz (67 kg)   SpO2 98%   BMI 20.59 kg/m   Physical Exam  Constitutional:      General: Not in acute distress.    Appearance: Normal appearance. Not ill-appearing.  HENT:     Head: Normocephalic and atraumatic.  Neck:     Musculoskeletal: Normal range of motion.  Cardiovascular:     Rate and Rhythm: Normal rate Pulmonary:     Effort: Pulmonary effort is normal. No respiratory distress.  Musculoskeletal: Normal range of motion.  Skin:    General: Skin is warm and dry.     Findings: No erythema or rash.  Neurological:     Mental Status: Alert and oriented to person, place, and time. Mental status  is at baseline.  Psychiatric:        Mood and Affect: Mood normal.        Behavior: Behavior normal.    Assessment/Plan: The patient is scheduled for excision of bilateral temple skin lesions with Dr. Waddell.  Risks, benefits, and alternatives of procedure discussed, questions answered and consent obtained.    Smoking Status: Nonsmoker; Counseling Given? N/A  Caprini Score: 4; Risk Factors include: Age and length of planned surgery. Recommendation  for mechanical prophylaxis. Encourage early ambulation.   Pictures obtained: @consult   Post-op Rx sent to pharmacy: Norco, Zofran   Discussed with the patient to hold his Plavix  5 days prior to surgery.  Instructed him to hold his aspirin -salicylate 9-caffeine at least 1 week prior to surgery.  Discussed with patient to hold his alendronate the day of surgery.  Instructed him to hold his Zetia  the day before surgery.  Instructed him to hold any multivitamins or supplements at least 1 week prior to surgery.  Patient expressed understanding.  Patient was provided with the General Surgical Risk consent document and Pain Medication Agreement prior to their appointment.  They had adequate time to read through the risk consent documents and Pain Medication Agreement. We also discussed them in person together during this preop appointment. All of their questions were answered to their satisfaction.  Recommended calling if they have any further questions.  Risk consent form and Pain Medication Agreement to be scanned into patient's chart.  The consent was obtained with risks and complications reviewed which included bleeding, pain, scar, infection and the risk of anesthesia.  The patients questions were answered to the patients expressed satisfaction.    Electronically signed by: Estefana FORBES Peck, PA-C 05/09/2024 1:06 PM

## 2024-05-21 ENCOUNTER — Telehealth: Payer: Self-pay

## 2024-05-21 NOTE — Telephone Encounter (Signed)
 Patient called to confirm a certain medication he was advised to hold before his surgery on Monday, 7/28. He wanted to know what aspirin -salicylamide-caffeine was. I advised that it was [facility] Headache powder. Patient conveyed understanding.

## 2024-05-24 ENCOUNTER — Encounter (HOSPITAL_COMMUNITY): Payer: Self-pay | Admitting: Plastic Surgery

## 2024-05-24 ENCOUNTER — Other Ambulatory Visit: Payer: Self-pay

## 2024-05-24 NOTE — Progress Notes (Signed)
 PCP - Claudene Round, MD  Cardiologist - Delford Maude BROCKS, MD   PPM/ICD - denies Device Orders - n/a Rep Notified - n/a  Chest x-ray - 08-07-20 EKG - 03-01-24 Stress Test - 03-14-24 ECHO - 02-01-22 Cardiac Cath - 06-16-20  CPAP - denies  Dm -denies  Blood Thinner Instructions: clopidogrel  (PLAVIX ) Last dose 05-21-24 Aspirin  Instructions: denies  ERAS Protcol - clear liquids until 12:45 PM  COVID TEST- n/a  Anesthesia review: Yes Hx: MI, CAD, HTN, CABG  Patient verbally denies any shortness of breath, fever, cough and chest pain during phone call   -------------  SDW INSTRUCTIONS given:  Your procedure is scheduled on May 28, 2024.  Report to [facility] at 1:15 P.M., and check in at the Admitting office.  Call this number if you have problems the morning of surgery:  [phone]   Remember:  Do not eat after midnight the night before your surgery  You may drink clear liquids until 12:45 the day of your surgery.   Clear liquids allowed are: Water, Non-Citrus Juices (without pulp), Carbonated Beverages, Clear Tea, Black Coffee Only, and Gatorade    Take these medicines the morning of surgery with A SIP OF WATER  albuterol  (VENTOLIN  HFA) inhaler  ezetimibe  (ZETIA )  fenofibrate  micronized (LOFIBRA)  HYDROcodone -acetaminophen  (NORCO/VICODIN)  lamoTRIgine  (LAMICTAL )  metoprolol  succinate (TOPROL -XL)  nitroGLYCERIN  (NITROSTAT )  ondansetron  (ZOFRAN )  pantoprazole  (PROTONIX )  rosuvastatin  (CRESTOR )   As of today, STOP taking any Aspirin  (unless otherwise instructed by your surgeon) Aleve, Naproxen, Ibuprofen, Motrin, Advil, Goody's, BC's, all herbal medications, fish oil, and all vitamins.                      Do not wear jewelry, make up, or nail polish            Do not wear lotions, powders, perfumes/colognes, or deodorant.            Do not shave 48 hours prior to surgery.  Men may shave face and neck.            Do not bring valuables to the  hospital.            [facility] is not responsible for any belongings or valuables.  Do NOT Smoke (Tobacco/Vaping) 24 hours prior to your procedure If you use a CPAP at night, you may bring all equipment for your overnight stay.   Contacts, glasses, dentures or bridgework may not be worn into surgery.      For patients admitted to the hospital, discharge time will be determined by your treatment team.   Patients discharged the day of surgery will not be allowed to drive home, and someone needs to stay with them for 24 hours.    Special instructions:   [place]- Preparing For Surgery  Before surgery, you can play an important role. Because skin is not sterile, your skin needs to be as free of germs as possible. You can reduce the number of germs on your skin by washing with CHG (chlorahexidine gluconate) Soap before surgery.  CHG is an antiseptic cleaner which kills germs and bonds with the skin to continue killing germs even after washing.    Oral Hygiene is also important to reduce your risk of infection.  Remember - BRUSH YOUR TEETH THE MORNING OF SURGERY WITH YOUR REGULAR TOOTHPASTE  Please do not use if you have an allergy to CHG or antibacterial soaps. If your skin becomes reddened/irritated  stop using the CHG.  Do not shave (including legs and underarms) for at least 48 hours prior to first CHG shower. It is OK to shave your face.  Please follow these instructions carefully.   Shower the NIGHT BEFORE SURGERY and the MORNING OF SURGERY with DIAL Soap.   Pat yourself dry with a CLEAN TOWEL.  Wear CLEAN PAJAMAS to bed the night before surgery  Place CLEAN SHEETS on your bed the night of your first shower and DO NOT SLEEP WITH PETS.   Day of Surgery: Please shower morning of surgery  Wear Clean/Comfortable clothing the morning of surgery Do not apply any deodorants/lotions.   Remember to brush your teeth WITH YOUR REGULAR TOOTHPASTE.   Questions were answered. Patient  verbalized understanding of instructions.

## 2024-05-25 NOTE — Anesthesia Preprocedure Evaluation (Addendum)
 Anesthesia Evaluation  Patient identified by MRN, date of birth, ID band Patient awake    Reviewed: Allergy & Precautions, NPO status , Patient's Chart, lab work & pertinent test results  History of Anesthesia Complications Negative for: history of anesthetic complications  Airway Mallampati: II  TM Distance: >3 FB Neck ROM: Full    Dental  (+) Teeth Intact, Dental Advisory Given   Pulmonary asthma    breath sounds clear to auscultation       Cardiovascular hypertension, Pt. on medications + CAD, + Past MI and + CABG   Rhythm:Regular  Nuclear stress test 03/14/2024: IMPRESSION: 1. No reversible ischemia or infarction. 2. Normal left ventricular wall motion. Mild LEFT ventricular dilatation 3. Left ventricular ejection fraction 71% 4. Non invasive risk stratification: Low      Neuro/Psych  Neuromuscular disease    GI/Hepatic ,GERD  ,,  Endo/Other    Renal/GU Renal diseaseLab Results      Component                Value               Date                      NA                       141                 05/28/2024                K                        4.3                 05/28/2024                CO2                      23                  05/28/2024                GLUCOSE                  89                  05/28/2024                BUN                      13                  05/28/2024                CREATININE               0.85                05/28/2024                CALCIUM                   9.5                 05/28/2024                EGFR  84                  09/05/2023                GFRNONAA                 >60                 05/28/2024                Musculoskeletal negative musculoskeletal ROS (+)    Abdominal   Peds  Hematology Lab Results      Component                Value               Date                      WBC                      5.6                 05/28/2024                 HGB                      13.8                05/28/2024                HCT                      40.6                05/28/2024                MCV                      95.3                05/28/2024                PLT                      144 (L)             05/28/2024              Anesthesia Other Findings MS  Reproductive/Obstetrics                              Anesthesia Physical Anesthesia Plan  ASA: 3  Anesthesia Plan: General   Post-op Pain Management: Toradol IV (intra-op)*   Induction: Intravenous  PONV Risk Score and Plan: 2 and Ondansetron  and Dexamethasone   Airway Management Planned: LMA  Additional Equipment: None  Intra-op Plan:   Post-operative Plan: Extubation in OR  Informed Consent: I have reviewed the patients History and Physical, chart, labs and discussed the procedure including the risks, benefits and alternatives for the proposed anesthesia with the patient or authorized representative who has indicated his/her understanding and acceptance.     Dental advisory given  Plan Discussed with: CRNA  Anesthesia Plan Comments: (PAT note written 05/25/2024 by Allison Zelenak, PA-C.  )         Anesthesia Quick Evaluation

## 2024-05-25 NOTE — Progress Notes (Signed)
 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 He was referred to Dr. Waddell by dermatologist Dr. 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 Anesthesia type is posted as Choice.   History includes never smoker, Multiple sclerosis (diagnosed 1999), CAD (inferior MI, s/p tPA in [place]->CABG: LIMA-LAD, SVG-PDA-PLA, SVG-D1-CX 2003; NSTEMI, s/p redo CABG: RIMA-RCA GSV graft, SVG-DIAG-OM 06/18/20), ischemic cardiomyopathy (LVEF 40-45% 06/17/20->60-65% 02/01/22), HTN, hypercholesterolemia, NAFLD, asthma, dyspnea, neuropathy, GERD, CKD, BPH, mandibular surgery.  Cardiology input outlined on 04/17/24 by Loistine Sober, NP, Evalene DELENA Parry was last seen on 04/06/2024 by Dr. Delford.  Per Dr. Delford Belton to proceed with skin cancer removal Can hold plavix  5 days before if necessary.   Therefore, based on ACC/AHA guidelines, the patient would be an acceptable risk for the planned procedure without further cardiovascular testing...  Last Plavix  dose reported as 05/21/2024.  Anesthesia team to evaluate on the day of surgery.    VS: Ht 5' 11 (1.803 m)   Wt 71 kg   BMI 21.83 kg/m  BP Readings from Last 3 Encounters:  05/09/24 128/82  04/13/24 134/77  04/06/24 (!) 110/55   Pulse Readings from Last 3 Encounters:  05/09/24 (!) 55  04/13/24 (!) 56  04/06/24 60    PROVIDERS: Claudene Round, MD is PCP  Delford Coy, MD is cardiologist Vear Ade, MD is neurologist. Last visit 03/20/24. He completed 2 years of [employer].  Lamotrigine  prescribed for leg dysesthesias. Plan for lumbar MRI. Return in 7 months.    LABS: For day of surgery as indicated. Last results in [facility] include: Lab Results  Component Value Date   WBC 4.1 03/20/2024   HGB 13.6 03/20/2024   HCT 40.6 03/20/2024   PLT 153 03/20/2024   GLUCOSE 128 (H) 09/05/2023   CHOL 201 (H) 03/01/2024   TRIG 84 03/01/2024   HDL 53 03/01/2024   LDLCALC 133 (H) 03/01/2024   ALT 20 03/20/2024   AST 25 03/20/2024   NA 140 09/05/2023   K 3.6 09/05/2023   CL 104 09/05/2023   CREATININE 1.01 09/05/2023   BUN 19 09/05/2023   CO2 23 09/05/2023    IMAGES: MRI Brain 07/11/2023: IMPRESSION:    Scattered T2/FLAIR hyperintense foci in the periventricular and deep white matter of the cerebral hemispheres.  None of the foci enhance or appear to be acute.  Compared to the MRI from 11/11/2016, there were no new lesions.  There are nonspecific, these are consistent with chronic demyelinating plaques associated with multiple sclerosis. No acute findings.  Normal enhancement pattern.  MRI C-spine 07/11/2023: IMPRESSION: T2 hyperintense foci within the spinal cord adjacent to C2-C3 and C5.  These were also noted on the previous MRI from 2018.  They do not enhance. Minimal disc degenerative changes at C4-C5 and C5-C6 do not lead to spinal stenosis or nerve root compression. Normal enhancement pattern.    EKG: EKG 03/01/2024: Sinus bradycardia. 46 bpm otherwise normal EKG. Compared to 06/19/2020, normal sinus rhythm at the rate of 78 bpm with  high lateral T wave inversion suggestive of subendocardial infarct versus ischemia. Confirmed by Ganji, Jagadeesh [phone]) on 03/01/2024 10:04:49 AM   CV: Nuclear stress test 03/14/2024: IMPRESSION: 1. No reversible ischemia or infarction. 2. Normal left ventricular wall motion. Mild LEFT ventricular dilatation 3. Left ventricular ejection fraction 71% 4. Non invasive risk stratification: Low    Echo 02/01/22: IMPRESSIONS   1. Left  ventricular ejection fraction, by estimation, is 60 to 65%. Left  ventricular ejection fraction by 3D volume is 65 %. The left ventricle has  normal function. The left ventricle has no regional wall motion  abnormalities. Left ventricular diastolic   parameters were normal.   2. Right ventricular systolic function is normal. The right ventricular  size is mildly enlarged. There is normal pulmonary artery systolic  pressure. The estimated right ventricular systolic pressure is 28.4 mmHg.   3. The mitral valve is normal in structure. Trivial mitral valve  regurgitation. No evidence of mitral stenosis.   4. The aortic valve is tricuspid. Aortic valve regurgitation is not  visualized. Aortic valve sclerosis is present, with no evidence of aortic  valve stenosis.   5. The inferior vena cava is normal in size with greater than 50%  respiratory variability, suggesting right atrial pressure of 3 mmHg.  - Comparison: 8/1/721 LVEF 40-45%, anterior/anterolateral hypokinesis, moderate asymmetric LVH of the basal septal segment, grade 1 DD, normal RVSF, normal PASP, RVSP 23.2 mmHg; 01/20/15 LVEF 55-60%, no RWMA   US  Carotid 06/17/2020: Summary:  - Right Carotid: Velocities in the right ICA are consistent with a 1-39% stenosis. The ECA appears >50% stenosed.  - Left Carotid: Velocities in the left ICA are consistent with a 1-39% stenosis.  - Vertebrals: Bilateral vertebral arteries demonstrate antegrade flow.  - Subclavians: Normal flow hemodynamics were seen in bilateral subclavian arteries.    Past Medical History:  Diagnosis Date   Abnormal CBC    Actinic keratoses    Asthma    Bladder neoplasm    BMI 21.0-21.9, adult    BPH (benign prostatic hyperplasia)    CAD (coronary artery disease)    s/p Inf STEMI >> S/p CABG in 2003 // Myoview  11/2018: low risk // s/p NSTEMI 8/21 >> s/p redo CABG   Cancer (HCC)    Chronic constipation    Chronic lumbar radiculopathy    Chronic pain of right elbow     CKD (chronic kidney disease)    stage 3   Colon polyp    Colonic obstruction (HCC)    ED (erectile dysfunction)    Erysipelas    Fatigue    GERD (gastroesophageal reflux disease)    Hepatic steatosis    HTN (hypertension)    Hypercholesterolemia    Hypomagnesemia    Impacted cerumen, bilateral    Ischemic cardiomyopathy    Echo 8/21: EF 40-45, anterior/anterolateral HK, moderate asymmetric LVH, GR 1 DD, normal RVSF, RVSP 23.2   Lipids abnormal    Low back pain    Lumbar spinal stenosis    Lymph node enlargement    Mixed hyperlipidemia    Intol of statins (?elevated LFTs)   MRSA (methicillin resistant Staphylococcus aureus)    Multiple sclerosis (HCC)    Multiple sclerosis (HCC)    Myocardial infarction (HCC)    Neuropathy    Nonalcoholic fatty liver disease    Osteoporosis    Seasonal allergies    Seborrheic dermatitis of scalp    Skin lesion    SOB (shortness of breath)  Tinea corporis    Tinea cruris    Urinary bladder neoplasm    Vitamin D deficiency    Weight loss     Past Surgical History:  Procedure Laterality Date   CORONARY ARTERY BYPASS GRAFT N/A 06/18/2020   Procedure: REDO CORONARY ARTERY BYPASS GRAFTING (CABG), ON PUMP, TIMES THREE, USING RIGHT INTERNAL MAMMARY ARTERY AND ENDOSCOPICALLY HARVESTED LEFT GREATER SAPHENOUS VEIN;  Surgeon: Army Dallas NOVAK, MD;  Location: [facility];  Service: Open Heart Surgery;  Laterality: N/A;  SEQENTIAL DIAG TO OM RIMA TO PREVIOUS DISTAL RIGHT VEIN GRAFT   ENDOVEIN HARVEST OF GREATER SAPHENOUS VEIN Left 06/18/2020   Procedure: ENDOVEIN HARVEST OF GREATER SAPHENOUS VEIN;  Surgeon: Army Dallas NOVAK, MD;  Location: [facility] OR;  Service: Open Heart Surgery;  Laterality: Left;   LEFT HEART CATH AND CORS/GRAFTS ANGIOGRAPHY N/A 06/16/2020   Procedure: LEFT HEART CATH AND CORS/GRAFTS ANGIOGRAPHY;  Surgeon: Claudene Victory ORN, MD;  Location: [facility] INVASIVE CV LAB;  Service: Cardiovascular;  Laterality: N/A;   MANDIBLE SURGERY     TEE WITHOUT  CARDIOVERSION N/A 06/18/2020   Procedure: TRANSESOPHAGEAL ECHOCARDIOGRAM (TEE);  Surgeon: Army Dallas NOVAK, MD;  Location: [facility] OR;  Service: Open Heart Surgery;  Laterality: N/A;    MEDICATIONS: No current facility-administered medications for this encounter.    albuterol  (VENTOLIN  HFA) 108 (90 Base) MCG/ACT inhaler   alendronate (FOSAMAX) 70 MG tablet   Aspirin -Salicylamide-Caffeine (BC HEADACHE POWDER PO)   cholecalciferol (VITAMIN D3) 25 MCG (1000 UNIT) tablet   clopidogrel  (PLAVIX ) 75 MG tablet   ezetimibe  (ZETIA ) 10 MG tablet   fenofibrate  micronized (LOFIBRA) 134 MG capsule   lamoTRIgine  (LAMICTAL ) 25 MG tablet   losartan  (COZAAR ) 25 MG tablet   metoprolol  succinate (TOPROL -XL) 25 MG 24 hr tablet   nitroGLYCERIN  (NITROSTAT ) 0.4 MG SL tablet   pantoprazole  (PROTONIX ) 40 MG tablet   rosuvastatin  (CRESTOR ) 10 MG tablet   HYDROcodone -acetaminophen  (NORCO/VICODIN) 5-325 MG tablet   ondansetron  (ZOFRAN ) 4 MG tablet    Isaiah Ruder, PA-C Surgical Short Stay/Anesthesiology [facility] Phone [phone] [facility] Phone [phone] 05/25/2024 10:05 AM

## 2024-05-28 ENCOUNTER — Encounter (HOSPITAL_COMMUNITY)
Admission: RE | Disposition: A | Discharge status: Home / Self Care | Payer: Self-pay | Source: Home / Self Care | Attending: Plastic Surgery

## 2024-05-28 ENCOUNTER — Ambulatory Visit (HOSPITAL_COMMUNITY): Payer: Self-pay | Admitting: Vascular Surgery

## 2024-05-28 ENCOUNTER — Encounter (HOSPITAL_COMMUNITY): Payer: Self-pay | Admitting: Plastic Surgery

## 2024-05-28 ENCOUNTER — Ambulatory Visit (HOSPITAL_COMMUNITY)
Admission: RE | Admit: 2024-05-28 | Discharge: 2024-05-28 | Disposition: A | Discharge status: Home / Self Care | Attending: Plastic Surgery | Admitting: Plastic Surgery

## 2024-05-28 ENCOUNTER — Other Ambulatory Visit: Payer: Self-pay

## 2024-05-28 DIAGNOSIS — G35 Multiple sclerosis: Secondary | ICD-10-CM | POA: Diagnosis not present

## 2024-05-28 DIAGNOSIS — Z7902 Long term (current) use of antithrombotics/antiplatelets: Secondary | ICD-10-CM | POA: Insufficient documentation

## 2024-05-28 DIAGNOSIS — K219 Gastro-esophageal reflux disease without esophagitis: Secondary | ICD-10-CM | POA: Insufficient documentation

## 2024-05-28 DIAGNOSIS — L989 Disorder of the skin and subcutaneous tissue, unspecified: Secondary | ICD-10-CM | POA: Diagnosis not present

## 2024-05-28 DIAGNOSIS — N183 Chronic kidney disease, stage 3 unspecified: Secondary | ICD-10-CM | POA: Diagnosis not present

## 2024-05-28 DIAGNOSIS — K760 Fatty (change of) liver, not elsewhere classified: Secondary | ICD-10-CM | POA: Insufficient documentation

## 2024-05-28 DIAGNOSIS — I252 Old myocardial infarction: Secondary | ICD-10-CM | POA: Insufficient documentation

## 2024-05-28 DIAGNOSIS — D0439 Carcinoma in situ of skin of other parts of face: Secondary | ICD-10-CM | POA: Insufficient documentation

## 2024-05-28 DIAGNOSIS — Z951 Presence of aortocoronary bypass graft: Secondary | ICD-10-CM | POA: Diagnosis not present

## 2024-05-28 DIAGNOSIS — I10 Essential (primary) hypertension: Secondary | ICD-10-CM

## 2024-05-28 DIAGNOSIS — J45909 Unspecified asthma, uncomplicated: Secondary | ICD-10-CM | POA: Diagnosis not present

## 2024-05-28 DIAGNOSIS — L570 Actinic keratosis: Secondary | ICD-10-CM | POA: Insufficient documentation

## 2024-05-28 DIAGNOSIS — I1310 Hypertensive heart and chronic kidney disease without heart failure, with stage 1 through stage 4 chronic kidney disease, or unspecified chronic kidney disease: Secondary | ICD-10-CM | POA: Diagnosis not present

## 2024-05-28 DIAGNOSIS — E1122 Type 2 diabetes mellitus with diabetic chronic kidney disease: Secondary | ICD-10-CM | POA: Diagnosis not present

## 2024-05-28 DIAGNOSIS — I2510 Atherosclerotic heart disease of native coronary artery without angina pectoris: Secondary | ICD-10-CM | POA: Diagnosis not present

## 2024-05-28 DIAGNOSIS — Z8614 Personal history of Methicillin resistant Staphylococcus aureus infection: Secondary | ICD-10-CM | POA: Diagnosis not present

## 2024-05-28 DIAGNOSIS — D049 Carcinoma in situ of skin, unspecified: Secondary | ICD-10-CM | POA: Diagnosis present

## 2024-05-28 HISTORY — PX: EXCISION MASS HEAD: SHX6702

## 2024-05-28 HISTORY — DX: Acute myocardial infarction, unspecified: I21.9

## 2024-05-28 HISTORY — DX: Malignant (primary) neoplasm, unspecified: C80.1

## 2024-05-28 LAB — COMPREHENSIVE METABOLIC PANEL WITH GFR
ALT: 29 U/L (ref 0–44)
AST: 34 U/L (ref 15–41)
Albumin: 4.5 g/dL (ref 3.5–5.0)
Alkaline Phosphatase: 36 U/L — ABNORMAL LOW (ref 38–126)
Anion gap: 10 (ref 5–15)
BUN: 13 mg/dL (ref 8–23)
CO2: 23 mmol/L (ref 22–32)
Calcium: 9.5 mg/dL (ref 8.9–10.3)
Chloride: 108 mmol/L (ref 98–111)
Creatinine, Ser: 0.85 mg/dL (ref 0.61–1.24)
GFR, Estimated: >60 mL/min (ref >60)
Glucose, Bld: 89 mg/dL (ref 70–99)
Potassium: 4.3 mmol/L (ref 3.5–5.1)
Sodium: 141 mmol/L (ref 135–145)
Total Bilirubin: 1.3 mg/dL — ABNORMAL HIGH (ref 0.0–1.2)
Total Protein: 7.1 g/dL (ref 6.5–8.1)

## 2024-05-28 LAB — CBC
HCT: 40.6 % (ref 39.0–52.0)
Hemoglobin: 13.8 g/dL (ref 13.0–17.0)
MCH: 32.4 pg (ref 26.0–34.0)
MCHC: 34 g/dL (ref 30.0–36.0)
MCV: 95.3 fL (ref 80.0–100.0)
Platelets: 144 K/uL — ABNORMAL LOW (ref 150–400)
RBC: 4.26 MIL/uL (ref 4.22–5.81)
RDW: 13.2 % (ref 11.5–15.5)
WBC: 5.6 K/uL (ref 4.0–10.5)
nRBC: 0 % (ref 0.0–0.2)

## 2024-05-28 LAB — SURGICAL PCR SCREEN
MRSA, PCR: NEGATIVE
Staphylococcus aureus: NEGATIVE

## 2024-05-28 SURGERY — EXCISION, MASS, HEAD
Anesthesia: General

## 2024-05-28 MED ORDER — MIDAZOLAM HCL 2 MG/2ML IJ SOLN
INTRAMUSCULAR | Status: AC
  Filled 2024-05-28: qty 2

## 2024-05-28 MED ORDER — MEPERIDINE HCL 25 MG/ML IJ SOLN: 6.2500 mg | INTRAMUSCULAR | Status: DC | PRN

## 2024-05-28 MED ORDER — PROPOFOL 10 MG/ML IV BOLUS
INTRAVENOUS | Status: AC
  Filled 2024-05-28: qty 20

## 2024-05-28 MED ORDER — DEXAMETHASONE SODIUM PHOSPHATE 10 MG/ML IJ SOLN
INTRAMUSCULAR | Status: AC
Start: 2024-05-28 — End: 2024-05-28
  Filled 2024-05-28: qty 1

## 2024-05-28 MED ORDER — FENTANYL CITRATE (PF) 100 MCG/2ML IJ SOLN: 25.0000 ug | INTRAMUSCULAR | Status: DC | PRN

## 2024-05-28 MED ORDER — LIDOCAINE-EPINEPHRINE 1 %-1:100000 IJ SOLN
INTRAMUSCULAR | Status: AC
  Filled 2024-05-28: qty 1

## 2024-05-28 MED ORDER — CHLORHEXIDINE GLUCONATE CLOTH 2 % EX PADS: 6.0000 | MEDICATED_PAD | Freq: Once | CUTANEOUS | Status: DC

## 2024-05-28 MED ORDER — LACTATED RINGERS IV SOLN: INTRAVENOUS | Status: DC

## 2024-05-28 MED ORDER — CHLORHEXIDINE GLUCONATE 0.12 % MT SOLN
15.0000 mL | Freq: Once | OROMUCOSAL | Status: AC
  Administered 2024-05-28: 15 mL via OROMUCOSAL
  Filled 2024-05-28: qty 15

## 2024-05-28 MED ORDER — MIDAZOLAM HCL 2 MG/2ML IJ SOLN
INTRAMUSCULAR | Status: DC | PRN
  Administered 2024-05-28: 2 mg via INTRAVENOUS

## 2024-05-28 MED ORDER — 0.9 % SODIUM CHLORIDE (POUR BTL) OPTIME
TOPICAL | Status: DC | PRN
  Administered 2024-05-28: 1000 mL

## 2024-05-28 MED ORDER — OXYCODONE HCL 5 MG PO TABS: 5.0000 mg | ORAL_TABLET | Freq: Once | ORAL | Status: DC | PRN

## 2024-05-28 MED ORDER — PROPOFOL 10 MG/ML IV BOLUS
INTRAVENOUS | Status: DC | PRN
  Administered 2024-05-28: 150 mg via INTRAVENOUS

## 2024-05-28 MED ORDER — ONDANSETRON HCL 4 MG/2ML IJ SOLN: 4.0000 mg | Freq: Once | INTRAMUSCULAR | Status: DC | PRN

## 2024-05-28 MED ORDER — BUPIVACAINE-EPINEPHRINE (PF) 0.25% -1:200000 IJ SOLN
INTRAMUSCULAR | Status: DC | PRN
  Administered 2024-05-28: 10 mL

## 2024-05-28 MED ORDER — CEFAZOLIN SODIUM-DEXTROSE 2-4 GM/100ML-% IV SOLN
2.0000 g | INTRAVENOUS | Status: AC
  Administered 2024-05-28: 2 g via INTRAVENOUS
  Filled 2024-05-28: qty 100

## 2024-05-28 MED ORDER — FENTANYL CITRATE (PF) 250 MCG/5ML IJ SOLN
INTRAMUSCULAR | Status: DC | PRN
  Administered 2024-05-28: 50 ug via INTRAVENOUS
  Administered 2024-05-28 (×2): 25 ug via INTRAVENOUS

## 2024-05-28 MED ORDER — OXYCODONE HCL 5 MG/5ML PO SOLN: 5.0000 mg | Freq: Once | ORAL | Status: DC | PRN

## 2024-05-28 MED ORDER — ONDANSETRON HCL 4 MG/2ML IJ SOLN
INTRAMUSCULAR | Status: AC
Start: 2024-05-28 — End: 2024-05-28
  Filled 2024-05-28: qty 2

## 2024-05-28 MED ORDER — ORAL CARE MOUTH RINSE: 15.0000 mL | Freq: Once | OROMUCOSAL | Status: AC

## 2024-05-28 MED ORDER — EPHEDRINE SULFATE (PRESSORS) 50 MG/ML IJ SOLN
INTRAMUSCULAR | Status: DC | PRN
  Administered 2024-05-28 (×2): 5 mg via INTRAVENOUS

## 2024-05-28 MED ORDER — BUPIVACAINE-EPINEPHRINE (PF) 0.25% -1:200000 IJ SOLN
INTRAMUSCULAR | Status: AC
  Filled 2024-05-28: qty 30

## 2024-05-28 MED ORDER — FENTANYL CITRATE (PF) 250 MCG/5ML IJ SOLN
INTRAMUSCULAR | Status: AC
  Filled 2024-05-28: qty 5

## 2024-05-28 MED ORDER — LIDOCAINE 2% (20 MG/ML) 5 ML SYRINGE
INTRAMUSCULAR | Status: DC | PRN
  Administered 2024-05-28: 100 mg via INTRAVENOUS

## 2024-05-28 SURGICAL SUPPLY — 37 items
BAG COUNTER SPONGE SURGICOUNT (BAG) ×1 IMPLANT
BLADE CLIPPER SURG (BLADE) IMPLANT
BLADE SURG 15 STRL LF DISP TIS (BLADE) ×1 IMPLANT
CANISTER SUCTION 3000ML PPV (SUCTIONS) IMPLANT
DERMABOND ADVANCED .7 DNX12 (GAUZE/BANDAGES/DRESSINGS) IMPLANT
DRAPE LAPAROTOMY 100X72 PEDS (DRAPES) IMPLANT
DRAPE U-SHAPE 76X120 STRL (DRAPES) IMPLANT
DRESSING MEPILEX FLEX 4X4 (GAUZE/BANDAGES/DRESSINGS) IMPLANT
DRSG TEGADERM 2-3/8X2-3/4 SM (GAUZE/BANDAGES/DRESSINGS) IMPLANT
ELECT CAUTERY BLADE 6.4 (BLADE) IMPLANT
ELECT COATED BLADE 2.86 ST (ELECTRODE) IMPLANT
ELECT NDL BLADE 2-5/6 (NEEDLE) IMPLANT
ELECT NEEDLE BLADE 2-5/6 (NEEDLE) IMPLANT
ELECTRODE REM PT RTRN 9FT ADLT (ELECTROSURGICAL) ×1 IMPLANT
GAUZE SPONGE 4X4 12PLY STRL LF (GAUZE/BANDAGES/DRESSINGS) ×1 IMPLANT
GLOVE BIO SURGEON STRL SZ8 (GLOVE) ×1 IMPLANT
GLOVE BIOGEL PI IND STRL 8 (GLOVE) ×1 IMPLANT
GOWN STRL REUS W/ TWL LRG LVL3 (GOWN DISPOSABLE) ×3 IMPLANT
KIT BASIN OR (CUSTOM PROCEDURE TRAY) ×1 IMPLANT
NDL HYPO 25GX1X1/2 BEV (NEEDLE) ×1 IMPLANT
NEEDLE HYPO 25GX1X1/2 BEV (NEEDLE) ×1 IMPLANT
NS IRRIG 1000ML POUR BTL (IV SOLUTION) ×1 IMPLANT
PACK SRG BSC III STRL LF ECLPS (CUSTOM PROCEDURE TRAY) ×1 IMPLANT
PENCIL BUTTON HOLSTER BLD 10FT (ELECTRODE) ×1 IMPLANT
SPIKE FLUID TRANSFER (MISCELLANEOUS) ×1 IMPLANT
SPONGE T-LAP 18X18 ~~LOC~~+RFID (SPONGE) ×2 IMPLANT
STRIP CLOSURE SKIN 1/2X4 (GAUZE/BANDAGES/DRESSINGS) IMPLANT
SUT MNCRL AB 3-0 PS2 27 (SUTURE) IMPLANT
SUT MNCRL AB 4-0 PS2 18 (SUTURE) IMPLANT
SUT MON AB 3-0 SH27 (SUTURE) IMPLANT
SUT PROLENE 5 0 PS 2 (SUTURE) IMPLANT
SUT SILK 2 0 SH (SUTURE) ×1 IMPLANT
SYR BULB EAR ULCER 3OZ GRN STR (SYRINGE) ×1 IMPLANT
SYR CONTROL 10ML LL (SYRINGE) ×1 IMPLANT
TOWEL GREEN STERILE FF (TOWEL DISPOSABLE) ×1 IMPLANT
TUBE CONNECTING 12X1/4 (SUCTIONS) ×1 IMPLANT
YANKAUER SUCT BULB TIP NO VENT (SUCTIONS) ×1 IMPLANT

## 2024-05-28 NOTE — Anesthesia Procedure Notes (Signed)
 Procedure Name: LMA Insertion Date/Time: 05/28/2024 4:18 PM  Performed by: Alaiza Yau J, CRNAPre-anesthesia Checklist: Patient identified, Emergency Drugs available, Suction available and Patient being monitored Patient Re-evaluated:Patient Re-evaluated prior to induction Oxygen Delivery Method: Circle System Utilized Preoxygenation: Pre-oxygenation with 100% oxygen Induction Type: IV induction Ventilation: Mask ventilation without difficulty LMA: LMA inserted LMA Size: 4.0 Number of attempts: 1 Airway Equipment and Method: Bite block Placement Confirmation: positive ETCO2 Tube secured with: Tape Dental Injury: Teeth and Oropharynx as per pre-operative assessment

## 2024-05-28 NOTE — Interval H&P Note (Signed)
 History and Physical Interval Note: No change in exam or indication for surgery All questions answered Sites marked with his concurrence Will proceed awith excision of skin lesions at his request  05/28/2024 3:37 PM  Gregory Gardner  has presented today for surgery, with the diagnosis of d04.9.  The various methods of treatment have been discussed with the patient and family. After consideration of risks, benefits and other options for treatment, the patient has consented to  Procedure(s) with comments: EXCISION, MASS, HEAD (N/A) - Excision of bilateral temple skin lesions as a surgical intervention.  The patient's history has been reviewed, patient examined, no change in status, stable for surgery.  I have reviewed the patient's chart and labs.  Questions were answered to the patient's satisfaction.     Gregory Gardner

## 2024-05-28 NOTE — Transfer of Care (Signed)
 Immediate Anesthesia Transfer of Care Note  Patient: Gregory Gardner  Procedure(s) Performed: EXCISION, MASS, HEAD  Patient Location: PACU  Anesthesia Type:General  Level of Consciousness: awake, alert , and oriented  Airway & Oxygen Therapy: Patient Spontanous Breathing and Patient connected to face mask oxygen  Post-op Assessment: Report given to RN and Post -op Vital signs reviewed and stable  Post vital signs: Reviewed and stable  Last Vitals:  Vitals Value Taken Time  BP 136/65 05/28/24 18:07  Temp 36.7 C 05/28/24 18:07  Pulse 82 05/28/24 18:09  Resp 19 05/28/24 18:09  SpO2 97 % 05/28/24 18:09  Vitals shown include unfiled device data.  Last Pain:  Vitals:   05/28/24 1807  TempSrc:   PainSc: 0-No pain         Complications: No notable events documented.

## 2024-05-28 NOTE — Anesthesia Postprocedure Evaluation (Signed)
 Anesthesia Post Note  Patient: Gregory Gardner  Procedure(s) Performed: EXCISION, MASS, HEAD     Patient location during evaluation: PACU Anesthesia Type: General Level of consciousness: awake and alert and oriented Pain management: pain level controlled Vital Signs Assessment: post-procedure vital signs reviewed and stable Respiratory status: spontaneous breathing, nonlabored ventilation and respiratory function stable Cardiovascular status: blood pressure returned to baseline and stable Postop Assessment: no apparent nausea or vomiting Anesthetic complications: no   No notable events documented.  Last Vitals:  Vitals:   05/28/24 1815 05/28/24 1830  BP: 131/63 131/68  Pulse: 77 79  Resp: 12 17  Temp:    SpO2: 96% 96%    Last Pain:  Vitals:   05/28/24 1837  TempSrc:   PainSc: 0-No pain   Pain Goal:                   Kevon Tench A.

## 2024-05-28 NOTE — Op Note (Signed)
 DATE OF OPERATION: 05/28/2024  LOCATION: [facility] operating Room  PREOPERATIVE DIAGNOSIS: Skin lesions bilateral temporal region  POSTOPERATIVE DIAGNOSIS: Same  PROCEDURE: Wide local excision of bilateral temporal skin lesions  SURGEON: Marinell Birmingham, MD  ASSISTANT: Estefana Peck  EBL: 25 cc  CONDITION: Stable  COMPLICATIONS: None  INDICATION: The patient, Gregory Gardner, is a 63 y.o. male born on 12/27/1960, is here for treatment of a squamous cell carcinoma of the left temporal region and a skin lesion of indeterminate pathology on the right temple region.   PROCEDURE DETAILS:  The patient was seen prior to surgery and marked.  The IV antibiotics were given. The patient was taken to the operating room and given a general anesthetic. A standard time out was performed and all information was confirmed by those in the room. SCDs were placed.   The right temple region was addressed first.  The lesion which had been marked in the operating room and the surrounding tissues were infiltrated with quarter percent Marcaine  with epinephrine .  After an appropriate dwell time an elliptical incision taking a minimum of 5 mm margins was made around the area of concern.  The tissue was resected down to the subcutaneous fat.  The closure was quite tight so I elected to undermined the tissue for approximately 1 cm in all directions.  Hemostasis was achieved with suture ligation and electrocautery.  The deep tissues were approximated with 4-0 Monocryl sutures the dermis was approximated with interrupted 4-0 Monocryl sutures and the skin edges were approximated with interrupted 5-0 Prolene sutures.  The total length of the incision was 3 cm.  The left temporal region was addressed next.  Again an elliptical incision was made around the squamous cell carcinoma with a minimum of 5 mm margins.  The tissue was taken down to the subcutaneous tissue.  Undermining was done to allow a tension-free closure.  The deep  tissues were closed with interrupted 4-0 Monocryl sutures after hemostasis was achieved.  The dermis was closed with interrupted 4-0 Monocryl sutures and the skin was closed with interrupted 5-0 Prolene sutures the total length of this incision was approximately 4 cm.  Both specimens were sent to pathology marked with a short stitch in the superior margin and a long stitch in the lateral margin.  Dressings were applied and the patient was awakened from anesthesia without incident.  He was transferred to the recovery room in good condition.  All instrument needle and sponge counts were reported as correct.  No complications were appreciated during the procedure. The patient was allowed to wake up and taken to recovery room in stable condition at the end of the case. The family was notified at the end of the case.   The advanced practice practitioner (APP) assisted throughout the case.  The APP was essential in retraction and counter traction when needed to make the case progress smoothly.  This retraction and assistance made it possible to see the tissue plans for the procedure.  The assistance was needed for blood control, tissue re-approximation and assisted with closure of the incision site.

## 2024-05-28 NOTE — Discharge Instructions (Addendum)
 INSTRUCTIONS FOR AFTER SURGERY   You will likely have some questions about what to expect following your operation.  The following information will help you and your family understand what to expect when you are discharged from the hospital.  It is important to follow these guidelines to help ensure a smooth recovery and reduce complication.  Postoperative instructions include information on: diet, wound care, medications and physical activity.  AFTER SURGERY Expect to go home after the procedure.  In some cases, you may need to spend one night in the hospital for observation.  DIET The healthier you eat the better your body will heal. It is important to increasing your protein intake.  This means limiting the foods with sugar and carbohydrates.  Focus on vegetables and some meat.  If you have liposuction during your procedure be sure to drink water.  If your urine is bright yellow, then it is concentrated, and you need to drink more water.  As a general rule after surgery, you should have 8 ounces of water every hour while awake.  If you find you are persistently nauseated or unable to take in liquids let us  know.  NO TOBACCO USE or EXPOSURE.  This will slow your healing process and lead to a wound.  WOUND CARE After 24 hours, you may remove your dressings and shower. Allow soap and water to run over the incision. Do not scrub the incision. Do not submerge the incision in bath, pool, tub, etc.  No baths, pools or hot tubs for four weeks. We close your incision to leave the smallest and best-looking scar. No ointment or creams on your incisions for four weeks.  No Neosporin (Too many skin reactions).  A few weeks after surgery you can use Mederma and start massaging the scar.  ACTIVITY No heavy lifting until cleared by the doctor.  This usually means no more than a half-gallon of milk.  It is OK to walk and climb stairs. Moving your legs is very important to decrease your risk of a blood clot.  It  will also help keep you from getting deconditioned.  Every 1 to 2 hours get up and walk for 5 minutes. This will help with a quicker recovery back to normal.  Let pain be your guide so you don't do too much.  This time is for you to recover.  You will be more comfortable if you sleep and rest with your head elevated either with a few pillows under you or in a recliner.  No stomach sleeping for a three months.   DRIVING Arrange for someone to bring you home from the hospital after your surgery.  You may be able to drive a few days after surgery but not while taking any narcotics or valium.  BOWEL MOVEMENTS Constipation can occur after anesthesia and while taking pain medication.  It is important to stay ahead for your comfort.  We recommend taking Milk of Magnesia (2 tablespoons; twice a day) while taking the pain pills.  MEDICATIONS You may be prescribed should start after surgery At your preoperative visit for you history and physical you may have been given the following medications: Zofran  4 mg:  This is to treat nausea and vomiting.  You can take this every 6 hours as needed and only if needed. Norco 5-325 mg:  This is only to be used after you have taken the Motrin or the Tylenol . Every 8 hours as needed.   Over the counter Medication to take: Ibuprofen (  Motrin) 600 mg:  Take this every 6 hours.  If you have additional pain then take 500 mg of the Tylenol  every 8 hours.  Only take the Norco after you have tried these two. MiraLAX or Milk of Magnesia: Take this according to the bottle if you take the Norco.  WHEN TO CALL Call your surgeon's office if any of the following occur: Fever 101 degrees F or greater Excessive bleeding or fluid from the incision site. Pain that increases over time without aid from the medications Redness, warmth, or pus draining from incision sites Persistent nausea or inability to take in liquids Severe misshapen area that underwent the operation.

## 2024-05-29 ENCOUNTER — Encounter (HOSPITAL_COMMUNITY): Payer: Self-pay | Admitting: Plastic Surgery

## 2024-06-03 LAB — SURGICAL PATHOLOGY

## 2024-06-04 ENCOUNTER — Ambulatory Visit (INDEPENDENT_AMBULATORY_CARE_PROVIDER_SITE_OTHER): Admitting: Plastic Surgery

## 2024-06-04 VITALS — BP 126/84 | HR 53

## 2024-06-04 DIAGNOSIS — C44329 Squamous cell carcinoma of skin of other parts of face: Secondary | ICD-10-CM

## 2024-06-04 DIAGNOSIS — C44320 Squamous cell carcinoma of skin of unspecified parts of face: Secondary | ICD-10-CM

## 2024-06-04 NOTE — Progress Notes (Signed)
 Patient is a 63 year old male s/p excision of bilateral temporal SCC performed 05/28/2024 by Dr. Waddell who returns to clinic for postoperative follow-up.  Reviewed operative report and the excision sites were closed with 5-0 Prolene sutures.  The right facial excision had negative margins, but the left side had positive margins from 10-2 o'clock as well as diffuse deep margin involvement.  He was seen here in clinic on 06/04/2024.  At that time, the a few of the scattered sutures were removed, several others were left in place.  He was notified that he would need to return to the OR due to positive margins.  Pictures were obtained and placed in chart.  Recommend that he return for removal of remaining sutures.  Today, he is doing well.  He reports that he has not been putting anything on the incision since last encounter.  On exam, the right sided excision site appears well-healed.  On the left side, there remains a moderate amount of overlying scabbing that is gently removed with pickups.  All of the visible residual Prolene interrupted sutures are removed without complication or difficulty.  Appears to be healing appropriately.  See pictures.  Recommending Vaseline application to the left excision regularly to help with residual scabbing and healing.  Discussed case with the surgical scheduling team who received the surgical route from Dr. Waddell last week.  They are working on getting him added to the schedule ASAP.  Picture(s) obtained of the patient and placed in the chart were with the patient's or guardian's permission.

## 2024-06-04 NOTE — Progress Notes (Signed)
 Gregory Gardner returns today approximately 1 week postop from excision of skin lesions from temple areas.  Both lesions came back as squamous cell carcinoma.  The lesion on the right showed squamous cell carcinoma in situ with clear margins though he did have actinic keratoses at the margin of the excision.  On the left where the known squamous cell carcinoma was 3 positive margins at the 10 and 12:00 down to 7 and 8:00.  On examination both incisions are healing.  I elected to remove the sutures on the right temporal incision the left temporal incision Iulia portion of the incision the sutures were removed.  Will have him return later this week for removal of the remainder of the sutures and will schedule him for reexcision of positive margins in the operating his request photographs were obtained today and placed in the chart.  I spent 30 minutes reviewing the patient's chart pathology, examining the patient, coordinating care for his next procedure and documenting.

## 2024-06-07 ENCOUNTER — Ambulatory Visit: Admitting: Physician Assistant

## 2024-06-07 DIAGNOSIS — C44320 Squamous cell carcinoma of skin of unspecified parts of face: Secondary | ICD-10-CM

## 2024-06-12 ENCOUNTER — Ambulatory Visit (INDEPENDENT_AMBULATORY_CARE_PROVIDER_SITE_OTHER): Admitting: Surgical

## 2024-06-12 DIAGNOSIS — D049 Carcinoma in situ of skin, unspecified: Secondary | ICD-10-CM

## 2024-06-12 DIAGNOSIS — C44320 Squamous cell carcinoma of skin of unspecified parts of face: Secondary | ICD-10-CM

## 2024-06-12 MED ORDER — HYDROCODONE-ACETAMINOPHEN 5-325 MG PO TABS: 1.0000 | ORAL_TABLET | Freq: Three times a day (TID) | ORAL | 0 refills | Status: AC | PRN

## 2024-06-12 NOTE — H&P (View-Only) (Signed)
 Patient ID: Gregory Gardner, male    DOB: [birth year], 63 y.o.   MRN: [id number]  Chief Complaint  Patient presents with   Pre-op Exam      ICD-10-CM   1. Squamous cell carcinoma of face  C44.320     2. Squamous cell carcinoma in situ (SCCIS) of skin  D04.9       History of Present Illness: Gregory Gardner is a 63 y.o.  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  He presents for preoperative evaluation for upcoming procedure, Reexcision left temple skin lesion, complex closure, possible myriad placement , scheduled for 06/18/2024 with Dr. Waddell.  The patient has not had problems with anesthesia. No history of DVT/PE.  Patient is not on any hormone medications.  He denies any history of strokes.  No history of Crohn's or ulcerative colitis.  Reports asthma is well-controlled, very infrequently uses albuterol .  No varicosities to his lower extremities.  No recent changes to his health.  He has been feeling well since his previous surgery without any concerning changes.  He still remains active without shortness of breath or chest pain with activity. No family or personal history of bleeding or clotting disorders.    He is currently on Plavix .  He has previously received clearance from cardiology to hold Plavix  5 days prior to surgery.  Summary of Previous Visit: Patient with positive margins in the left temple squamous cell carcinoma excision, 3 positive margins at 10:00 and 12:00 down to 7 and 8:00.  Patient will need to undergo reexcision in the operating room.  PMH Significant for: Hypertension, NSTEMI, CAD, history of CABG x 5, hyperlipidemia, multiple sclerosis.  He is on Plavix  which he has been cleared to hold 5 days prior to surgery by cardiology.  Patient reports she does not use BC Goody powders frequently and is aware to stop this.  He does have a prescription for an albuterol  inhaler due to asthma, but he  reports he does not use this frequently.SABRA  He has been feeling well lately with no recent changes to his health.  He denies any recent cardiac or pulmonary symptoms.  He is fairly active without any symptomatic changes.  The patient gave consent to have this visit done by telemedicine / virtual visit, two identifiers were used to identify patient. This is also consent for access the chart and treat the patient via this visit. The patient is located [place] .  I, the provider, am at the office.  We spent 7 minutes together for the visit.  Joined by telephone.    Past Medical History: Allergies: Allergies  Allergen Reactions   Lodine [Etodolac] Nausea Only    Current Medications:  Current Outpatient Medications:    albuterol  (VENTOLIN  HFA) 108 (90 Base) MCG/ACT inhaler, Inhale 1 puff into the lungs daily as needed (for wheezing)., Disp: , Rfl:    alendronate (FOSAMAX) 70 MG tablet, Take 70 mg by mouth once a week. Take with a full glass of water on an empty stomach., Disp: , Rfl:    Aspirin -Salicylamide-Caffeine (BC HEADACHE POWDER PO), Take 1 packet by mouth daily as needed (pain)., Disp: , Rfl:    cholecalciferol (VITAMIN D3) 25 MCG (1000 UNIT) tablet, Take 1,000 Units by mouth daily., Disp: , Rfl:    clopidogrel  (PLAVIX ) 75 MG tablet, Take 1 tablet (75 mg total) by mouth daily., Disp: 90 tablet, Rfl: 3  ezetimibe  (ZETIA ) 10 MG tablet, Take 10 mg by mouth daily., Disp: , Rfl:    fenofibrate  micronized (LOFIBRA) 134 MG capsule, Take 134 mg by mouth daily., Disp: , Rfl:    HYDROcodone -acetaminophen  (NORCO/VICODIN) 5-325 MG tablet, Take 1 tablet by mouth every 6 (six) hours as needed for up to 10 doses for moderate pain (pain score 4-6)., Disp: 10 tablet, Rfl: 0   lamoTRIgine  (LAMICTAL ) 25 MG tablet, One po every day x 5 days, then one po bid x 5 days, then one po tid x 5 days, then 2 po bid (Patient taking differently: Take 50 mg by mouth 2 (two) times daily.), Disp: 120 tablet, Rfl:  5   losartan  (COZAAR ) 25 MG tablet, TAKE 1 TABLET BY MOUTH DAILY, Disp: 90 tablet, Rfl: 3   metoprolol  succinate (TOPROL -XL) 25 MG 24 hr tablet, Take 25 mg by mouth daily., Disp: , Rfl:    nitroGLYCERIN  (NITROSTAT ) 0.4 MG SL tablet, Place 1 tablet (0.4 mg total) under the tongue every 5 (five) minutes as needed for chest pain., Disp: 25 tablet, Rfl: 3   ondansetron  (ZOFRAN ) 4 MG tablet, Take 1 tablet (4 mg total) by mouth every 8 (eight) hours as needed for up to 10 doses for nausea or vomiting., Disp: 10 tablet, Rfl: 0   pantoprazole  (PROTONIX ) 40 MG tablet, Take 1 tablet (40 mg total) by mouth daily., Disp: 30 tablet, Rfl: 2   rosuvastatin  (CRESTOR ) 10 MG tablet, Take 1 tablet (10 mg total) by mouth daily., Disp: 90 tablet, Rfl: 3  Past Medical Problems: Past Medical History:  Diagnosis Date   Abnormal CBC    Actinic keratoses    Asthma    Bladder neoplasm    BMI 21.0-21.9, adult    BPH (benign prostatic hyperplasia)    CAD (coronary artery disease)    s/p Inf STEMI >> S/p CABG in 2003 // Myoview  11/2018: low risk // s/p NSTEMI 8/21 >> s/p redo CABG   Cancer (HCC)    Chronic constipation    Chronic lumbar radiculopathy    Chronic pain of right elbow    CKD (chronic kidney disease)    stage 3   Colon polyp    Colonic obstruction (HCC)    ED (erectile dysfunction)    Erysipelas    Fatigue    GERD (gastroesophageal reflux disease)    Hepatic steatosis    HTN (hypertension)    Hypercholesterolemia    Hypomagnesemia    Impacted cerumen, bilateral    Ischemic cardiomyopathy    Echo 8/21: EF 40-45, anterior/anterolateral HK, moderate asymmetric LVH, GR 1 DD, normal RVSF, RVSP 23.2   Lipids abnormal    Low back pain    Lumbar spinal stenosis    Lymph node enlargement    Mixed hyperlipidemia    Intol of statins (?elevated LFTs)   MRSA (methicillin resistant Staphylococcus aureus)    Multiple sclerosis (HCC)    Multiple sclerosis (HCC)    Myocardial infarction (HCC)     Neuropathy    Nonalcoholic fatty liver disease    Osteoporosis    Seasonal allergies    Seborrheic dermatitis of scalp    Skin lesion    SOB (shortness of breath)    Tinea corporis    Tinea cruris    Urinary bladder neoplasm    Vitamin D deficiency    Weight loss     Past Surgical History: Past Surgical History:  Procedure Laterality Date   CORONARY ARTERY BYPASS GRAFT N/A 06/18/2020  Procedure: REDO CORONARY ARTERY BYPASS GRAFTING (CABG), ON PUMP, TIMES THREE, USING RIGHT INTERNAL MAMMARY ARTERY AND ENDOSCOPICALLY HARVESTED LEFT GREATER SAPHENOUS VEIN;  Surgeon: Army Dallas NOVAK, MD;  Location: [facility] OR;  Service: Open Heart Surgery;  Laterality: N/A;  SEQENTIAL DIAG TO OM RIMA TO PREVIOUS DISTAL RIGHT VEIN GRAFT   ENDOVEIN HARVEST OF GREATER SAPHENOUS VEIN Left 06/18/2020   Procedure: ENDOVEIN HARVEST OF GREATER SAPHENOUS VEIN;  Surgeon: Army Dallas NOVAK, MD;  Location: [facility] OR;  Service: Open Heart Surgery;  Laterality: Left;   EXCISION MASS HEAD N/A 05/28/2024   Procedure: EXCISION, MASS, HEAD;  Surgeon: Waddell Leonce NOVAK, MD;  Location: [facility] OR;  Service: Plastics;  Laterality: N/A;  Excision of bilateral temple skin lesions   LEFT HEART CATH AND CORS/GRAFTS ANGIOGRAPHY N/A 06/16/2020   Procedure: LEFT HEART CATH AND CORS/GRAFTS ANGIOGRAPHY;  Surgeon: Claudene Victory ORN, MD;  Location: [facility] INVASIVE CV LAB;  Service: Cardiovascular;  Laterality: N/A;   MANDIBLE SURGERY     TEE WITHOUT CARDIOVERSION N/A 06/18/2020   Procedure: TRANSESOPHAGEAL ECHOCARDIOGRAM (TEE);  Surgeon: Army Dallas NOVAK, MD;  Location: [facility] OR;  Service: Open Heart Surgery;  Laterality: N/A;    Social History: Social History   Socioeconomic History   Marital status: Married    Spouse name: Not on file   Number of children: Not on file   Years of education: Not on file   Highest education level: Not on file  Occupational History   Not on file  Tobacco Use   Smoking status: Never   Smokeless tobacco: Never   Substance and Sexual Activity   Alcohol use: Yes    Alcohol/week: 21.0 standard drinks of alcohol    Types: 21 Cans of beer per week    Comment: Beer- daily   Drug use: No   Sexual activity: Not on file  Other Topics Concern   Not on file  Social History Narrative   Right handed   Caffeine use: Diet coke daily   Lives with dogs   Social Drivers of Health   Financial Resource Strain: Not on file  Food Insecurity: Not on file  Transportation Needs: Not on file  Physical Activity: Not on file  Stress: Not on file  Social Connections: Unknown (03/14/2022)   Received from [facility]   Social Network    Social Network: Not on file  Intimate Partner Violence: Unknown (02/04/2022)   Received from [facility]   HITS    Physically Hurt: Not on file    Insult or Talk Down To: Not on file    Threaten Physical Harm: Not on file    Scream or Curse: Not on file    Family History: Family History  Problem Relation Age of Onset   Heart Problems Mother    Heart Problems Father    Heart attack Father    Heart Problems Sister    Heart Problems Brother     Review of Systems: ROS  Physical Exam: Vital Signs There were no vitals taken for this visit.  Physical Exam Constitutional:      General: Not in acute distress.    Appearance: Normal appearance. Not ill-appearing.  HENT:     Head: Normocephalic and atraumatic.  Eyes:     Pupils: Pupils are equal, round Neck:     Musculoskeletal: Normal range of motion.  Cardiovascular:     Rate and Rhythm: Normal rate    Pulses: Normal pulses.  Pulmonary:     Effort: Pulmonary effort  is normal. No respiratory distress.  Abdominal:     General: Abdomen is flat. There is no distension.  Musculoskeletal: Normal range of motion.  Skin:    General: Skin is warm and dry.     Findings: No erythema or rash.  Neurological:     General: No focal deficit present.     Mental Status: Alert and oriented to person, place, and time. Mental  status is at baseline.     Motor: No weakness.  Psychiatric:        Mood and Affect: Mood normal.        Behavior: Behavior normal.    Assessment/Plan: The patient is scheduled for reexcision of left temple squamous cell carcinoma due to positive margins, complex closure, possible application of myriad wound matrix with Dr. Waddell.  Risks, benefits, and alternatives of procedure discussed, questions answered and consent obtained.    Smoking Status: Non-smoker; Counseling Given?  N/A  Caprini Score: 5; Risk Factors include: Age, length of surgery, history of MI, and length of planned surgery. Recommendation for mechanical prophylaxis. Encourage early ambulation.  Patient to restart Plavix  1 day after procedure.  Pictures obtained: @consult   Post-op Rx sent to pharmacy: Hydrocodone   Patient was provided with the  General Surgical Risk consent document and Pain Medication Agreement prior to their appointment.  They had adequate time to read through the risk consent documents and Pain Medication Agreement. We also discussed them in person together during this preop appointment. All of their questions were answered to their satisfaction.  Recommended calling if they have any further questions.  Risk consent form and Pain Medication Agreement to be scanned into patient's chart.  Discussed with patient surgical plan was reexcision of left temple residual squamous cell carcinoma due to positive margins, possible primary closure, however secondary intention healing may be needed due to tension in the area.  Patient may need myriad wound matrix applied to assist with secondary intention healing.  We discussed recommended dressing changes for this area.  All of his questions were answered to his content.    Electronically signed by: Donnice PARAS Jax Kentner, PA-C 06/12/2024 2:07 PM

## 2024-06-12 NOTE — Progress Notes (Signed)
 Patient ID: Gregory Gardner, male    DOB: Apr 16, 1961, 63 y.o.   MRN: [id number]  Chief Complaint  Patient presents with   Pre-op Exam      ICD-10-CM   1. Squamous cell carcinoma of face  C44.320     2. Squamous cell carcinoma in situ (SCCIS) of skin  D04.9       History of Present Illness: Gregory Gardner is a 63 y.o.  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  He presents for preoperative evaluation for upcoming procedure, Reexcision left temple skin lesion, complex closure, possible myriad placement , scheduled for 06/18/2024 with Dr. Waddell.  The patient has not had problems with anesthesia. No history of DVT/PE.  Patient is not on any hormone medications.  He denies any history of strokes.  No history of Crohn's or ulcerative colitis.  Reports asthma is well-controlled, very infrequently uses albuterol .  No varicosities to his lower extremities.  No recent changes to his health.  He has been feeling well since his previous surgery without any concerning changes.  He still remains active without shortness of breath or chest pain with activity. No family or personal history of bleeding or clotting disorders.    He is currently on Plavix .  He has previously received clearance from cardiology to hold Plavix  5 days prior to surgery.  Summary of Previous Visit: Patient with positive margins in the left temple squamous cell carcinoma excision, 3 positive margins at 10:00 and 12:00 down to 7 and 8:00.  Patient will need to undergo reexcision in the operating room.  PMH Significant for: Hypertension, NSTEMI, CAD, history of CABG x 5, hyperlipidemia, multiple sclerosis.  He is on Plavix  which he has been cleared to hold 5 days prior to surgery by cardiology.  Patient reports she does not use BC Goody powders frequently and is aware to stop this.  He does have a prescription for an albuterol  inhaler due to asthma, but he  reports he does not use this frequently.SABRA  He has been feeling well lately with no recent changes to his health.  He denies any recent cardiac or pulmonary symptoms.  He is fairly active without any symptomatic changes.  The patient gave consent to have this visit done by telemedicine / virtual visit, two identifiers were used to identify patient. This is also consent for access the chart and treat the patient via this visit. The patient is located [place] .  I, the provider, am at the office.  We spent 7 minutes together for the visit.  Joined by telephone.    Past Medical History: Allergies: Allergies  Allergen Reactions   Lodine [Etodolac] Nausea Only    Current Medications:  Current Outpatient Medications:    albuterol  (VENTOLIN  HFA) 108 (90 Base) MCG/ACT inhaler, Inhale 1 puff into the lungs daily as needed (for wheezing)., Disp: , Rfl:    alendronate (FOSAMAX) 70 MG tablet, Take 70 mg by mouth once a week. Take with a full glass of water on an empty stomach., Disp: , Rfl:    Aspirin -Salicylamide-Caffeine (BC HEADACHE POWDER PO), Take 1 packet by mouth daily as needed (pain)., Disp: , Rfl:    cholecalciferol (VITAMIN D3) 25 MCG (1000 UNIT) tablet, Take 1,000 Units by mouth daily., Disp: , Rfl:    clopidogrel  (PLAVIX ) 75 MG tablet, Take 1 tablet (75 mg total) by mouth daily., Disp: 90 tablet, Rfl: 3  ezetimibe  (ZETIA ) 10 MG tablet, Take 10 mg by mouth daily., Disp: , Rfl:    fenofibrate  micronized (LOFIBRA) 134 MG capsule, Take 134 mg by mouth daily., Disp: , Rfl:    HYDROcodone -acetaminophen  (NORCO/VICODIN) 5-325 MG tablet, Take 1 tablet by mouth every 6 (six) hours as needed for up to 10 doses for moderate pain (pain score 4-6)., Disp: 10 tablet, Rfl: 0   lamoTRIgine  (LAMICTAL ) 25 MG tablet, One po every day x 5 days, then one po bid x 5 days, then one po tid x 5 days, then 2 po bid (Patient taking differently: Take 50 mg by mouth 2 (two) times daily.), Disp: 120 tablet, Rfl:  5   losartan  (COZAAR ) 25 MG tablet, TAKE 1 TABLET BY MOUTH DAILY, Disp: 90 tablet, Rfl: 3   metoprolol  succinate (TOPROL -XL) 25 MG 24 hr tablet, Take 25 mg by mouth daily., Disp: , Rfl:    nitroGLYCERIN  (NITROSTAT ) 0.4 MG SL tablet, Place 1 tablet (0.4 mg total) under the tongue every 5 (five) minutes as needed for chest pain., Disp: 25 tablet, Rfl: 3   ondansetron  (ZOFRAN ) 4 MG tablet, Take 1 tablet (4 mg total) by mouth every 8 (eight) hours as needed for up to 10 doses for nausea or vomiting., Disp: 10 tablet, Rfl: 0   pantoprazole  (PROTONIX ) 40 MG tablet, Take 1 tablet (40 mg total) by mouth daily., Disp: 30 tablet, Rfl: 2   rosuvastatin  (CRESTOR ) 10 MG tablet, Take 1 tablet (10 mg total) by mouth daily., Disp: 90 tablet, Rfl: 3  Past Medical Problems: Past Medical History:  Diagnosis Date   Abnormal CBC    Actinic keratoses    Asthma    Bladder neoplasm    BMI 21.0-21.9, adult    BPH (benign prostatic hyperplasia)    CAD (coronary artery disease)    s/p Inf STEMI >> S/p CABG in 2003 // Myoview  11/2018: low risk // s/p NSTEMI 8/21 >> s/p redo CABG   Cancer (HCC)    Chronic constipation    Chronic lumbar radiculopathy    Chronic pain of right elbow    CKD (chronic kidney disease)    stage 3   Colon polyp    Colonic obstruction (HCC)    ED (erectile dysfunction)    Erysipelas    Fatigue    GERD (gastroesophageal reflux disease)    Hepatic steatosis    HTN (hypertension)    Hypercholesterolemia    Hypomagnesemia    Impacted cerumen, bilateral    Ischemic cardiomyopathy    Echo 8/21: EF 40-45, anterior/anterolateral HK, moderate asymmetric LVH, GR 1 DD, normal RVSF, RVSP 23.2   Lipids abnormal    Low back pain    Lumbar spinal stenosis    Lymph node enlargement    Mixed hyperlipidemia    Intol of statins (?elevated LFTs)   MRSA (methicillin resistant Staphylococcus aureus)    Multiple sclerosis (HCC)    Multiple sclerosis (HCC)    Myocardial infarction (HCC)     Neuropathy    Nonalcoholic fatty liver disease    Osteoporosis    Seasonal allergies    Seborrheic dermatitis of scalp    Skin lesion    SOB (shortness of breath)    Tinea corporis    Tinea cruris    Urinary bladder neoplasm    Vitamin D deficiency    Weight loss     Past Surgical History: Past Surgical History:  Procedure Laterality Date   CORONARY ARTERY BYPASS GRAFT N/A 06/18/2020  Procedure: REDO CORONARY ARTERY BYPASS GRAFTING (CABG), ON PUMP, TIMES THREE, USING RIGHT INTERNAL MAMMARY ARTERY AND ENDOSCOPICALLY HARVESTED LEFT GREATER SAPHENOUS VEIN;  Surgeon: Army Dallas NOVAK, MD;  Location: [facility] OR;  Service: Open Heart Surgery;  Laterality: N/A;  SEQENTIAL DIAG TO OM RIMA TO PREVIOUS DISTAL RIGHT VEIN GRAFT   ENDOVEIN HARVEST OF GREATER SAPHENOUS VEIN Left 06/18/2020   Procedure: ENDOVEIN HARVEST OF GREATER SAPHENOUS VEIN;  Surgeon: Army Dallas NOVAK, MD;  Location: [facility] OR;  Service: Open Heart Surgery;  Laterality: Left;   EXCISION MASS HEAD N/A 05/28/2024   Procedure: EXCISION, MASS, HEAD;  Surgeon: Waddell Leonce NOVAK, MD;  Location: [facility] OR;  Service: Plastics;  Laterality: N/A;  Excision of bilateral temple skin lesions   LEFT HEART CATH AND CORS/GRAFTS ANGIOGRAPHY N/A 06/16/2020   Procedure: LEFT HEART CATH AND CORS/GRAFTS ANGIOGRAPHY;  Surgeon: Claudene Victory ORN, MD;  Location: [facility] INVASIVE CV LAB;  Service: Cardiovascular;  Laterality: N/A;   MANDIBLE SURGERY     TEE WITHOUT CARDIOVERSION N/A 06/18/2020   Procedure: TRANSESOPHAGEAL ECHOCARDIOGRAM (TEE);  Surgeon: Army Dallas NOVAK, MD;  Location: [facility] OR;  Service: Open Heart Surgery;  Laterality: N/A;    Social History: Social History   Socioeconomic History   Marital status: Married    Spouse name: Not on file   Number of children: Not on file   Years of education: Not on file   Highest education level: Not on file  Occupational History   Not on file  Tobacco Use   Smoking status: Never   Smokeless tobacco: Never   Substance and Sexual Activity   Alcohol use: Yes    Alcohol/week: 21.0 standard drinks of alcohol    Types: 21 Cans of beer per week    Comment: Beer- daily   Drug use: No   Sexual activity: Not on file  Other Topics Concern   Not on file  Social History Narrative   Right handed   Caffeine use: Diet coke daily   Lives with dogs   Social Drivers of Health   Financial Resource Strain: Not on file  Food Insecurity: Not on file  Transportation Needs: Not on file  Physical Activity: Not on file  Stress: Not on file  Social Connections: Unknown (03/14/2022)   Received from [facility]   Social Network    Social Network: Not on file  Intimate Partner Violence: Unknown (02/04/2022)   Received from [facility]   HITS    Physically Hurt: Not on file    Insult or Talk Down To: Not on file    Threaten Physical Harm: Not on file    Scream or Curse: Not on file    Family History: Family History  Problem Relation Age of Onset   Heart Problems Mother    Heart Problems Father    Heart attack Father    Heart Problems Sister    Heart Problems Brother     Review of Systems: ROS  Physical Exam: Vital Signs There were no vitals taken for this visit.  Physical Exam Constitutional:      General: Not in acute distress.    Appearance: Normal appearance. Not ill-appearing.  HENT:     Head: Normocephalic and atraumatic.  Eyes:     Pupils: Pupils are equal, round Neck:     Musculoskeletal: Normal range of motion.  Cardiovascular:     Rate and Rhythm: Normal rate    Pulses: Normal pulses.  Pulmonary:     Effort: Pulmonary effort  is normal. No respiratory distress.  Abdominal:     General: Abdomen is flat. There is no distension.  Musculoskeletal: Normal range of motion.  Skin:    General: Skin is warm and dry.     Findings: No erythema or rash.  Neurological:     General: No focal deficit present.     Mental Status: Alert and oriented to person, place, and time. Mental  status is at baseline.     Motor: No weakness.  Psychiatric:        Mood and Affect: Mood normal.        Behavior: Behavior normal.    Assessment/Plan: The patient is scheduled for reexcision of left temple squamous cell carcinoma due to positive margins, complex closure, possible application of myriad wound matrix with Dr. Waddell.  Risks, benefits, and alternatives of procedure discussed, questions answered and consent obtained.    Smoking Status: Non-smoker; Counseling Given?  N/A  Caprini Score: 5; Risk Factors include: Age, length of surgery, history of MI, and length of planned surgery. Recommendation for mechanical prophylaxis. Encourage early ambulation.  Patient to restart Plavix  1 day after procedure.  Pictures obtained: @consult   Post-op Rx sent to pharmacy: Hydrocodone   Patient was provided with the  General Surgical Risk consent document and Pain Medication Agreement prior to their appointment.  They had adequate time to read through the risk consent documents and Pain Medication Agreement. We also discussed them in person together during this preop appointment. All of their questions were answered to their satisfaction.  Recommended calling if they have any further questions.  Risk consent form and Pain Medication Agreement to be scanned into patient's chart.  Discussed with patient surgical plan was reexcision of left temple residual squamous cell carcinoma due to positive margins, possible primary closure, however secondary intention healing may be needed due to tension in the area.  Patient may need myriad wound matrix applied to assist with secondary intention healing.  We discussed recommended dressing changes for this area.  All of his questions were answered to his content.    Electronically signed by: Donnice PARAS Logen Heintzelman, PA-C 06/12/2024 2:07 PM

## 2024-06-15 ENCOUNTER — Other Ambulatory Visit: Payer: Self-pay

## 2024-06-15 ENCOUNTER — Encounter (HOSPITAL_COMMUNITY): Payer: Self-pay | Admitting: Plastic Surgery

## 2024-06-15 NOTE — Anesthesia Preprocedure Evaluation (Signed)
 Anesthesia Evaluation  Patient identified by MRN, date of birth, ID band Patient awake    Reviewed: Allergy & Precautions, NPO status , Patient's Chart, lab work & pertinent test results  History of Anesthesia Complications Negative for: history of anesthetic complications  Airway Mallampati: II  TM Distance: >3 FB Neck ROM: Full    Dental  (+) Teeth Intact, Dental Advisory Given   Pulmonary asthma    breath sounds clear to auscultation       Cardiovascular hypertension, Pt. on medications + CAD, + Past MI and + CABG   Rhythm:Regular  Nuclear stress test 03/14/2024: IMPRESSION: 1. No reversible ischemia or infarction. 2. Normal left ventricular wall motion. Mild LEFT ventricular dilatation 3. Left ventricular ejection fraction 71% 4. Non invasive risk stratification: Low      Neuro/Psych  Neuromuscular disease    GI/Hepatic ,GERD  ,,  Endo/Other    Renal/GU Renal diseaseLab Results      Component                Value               Date                      NA                       141                 05/28/2024                K                        4.3                 05/28/2024                CO2                      23                  05/28/2024                GLUCOSE                  89                  05/28/2024                BUN                      13                  05/28/2024                CREATININE               0.85                05/28/2024                CALCIUM                   9.5                 05/28/2024                EGFR  84                  09/05/2023                GFRNONAA                 >60                 05/28/2024                Musculoskeletal negative musculoskeletal ROS (+)    Abdominal   Peds  Hematology Lab Results      Component                Value               Date                      WBC                      5.6                 05/28/2024                 HGB                      13.8                05/28/2024                HCT                      40.6                05/28/2024                MCV                      95.3                05/28/2024                PLT                      144 (L)             05/28/2024              Anesthesia Other Findings MS  Reproductive/Obstetrics                              Anesthesia Physical Anesthesia Plan  ASA: 3  Anesthesia Plan: General   Post-op Pain Management:    Induction: Intravenous  PONV Risk Score and Plan: 2 and Ondansetron , Dexamethasone , Treatment may vary due to age or medical condition and Midazolam   Airway Management Planned: LMA  Additional Equipment: None  Intra-op Plan:   Post-operative Plan: Extubation in OR  Informed Consent: I have reviewed the patients History and Physical, chart, labs and discussed the procedure including the risks, benefits and alternatives for the proposed anesthesia with the patient or authorized representative who has indicated his/her understanding and acceptance.     Dental advisory given  Plan Discussed with: CRNA  Anesthesia Plan Comments: (Discussed risks of anesthesia with patient, including PONV, sore throat, lip/dental/eye damage. Rare risks discussed as well, such as cardiorespiratory and neurological  sequelae, and allergic reactions. Discussed the role of CRNA in patient's perioperative care. Patient understands.)         Anesthesia Quick Evaluation

## 2024-06-15 NOTE — Progress Notes (Signed)
 PCP - Arnulfo Sharps, MD Cardiologist - Maude JAYSON Emmer, MD  EKG - 03/01/24 Stress Test - 03/14/24 ECHO - 02/11/22 Cardiac Cath - 06/16/20  Blood Thinner Instructions: He is on Plavix  which he has been cleared to hold 5 days prior to surgery by cardiology. Last dose 06/14/24  Anesthesia review: Y  Patient verbally denies any shortness of breath, fever, cough and chest pain during phone call   -------------  SDW INSTRUCTIONS given:  Your procedure is scheduled on Monday, August 18th.  Report to [facility] Main Entrance A at 0915 A.M., and check in at the Admitting office.  Call this number if you have problems the morning of surgery:  [phone]   Remember:  Do not eat after midnight the night before your surgery  You may drink clear liquids until 0830 the morning of your surgery.   Clear liquids allowed are: Water, Non-Citrus Juices (without pulp), Carbonated Beverages, Clear Tea, Black Coffee Only, and Gatorade    Take these medicines the morning of surgery with A SIP OF WATER  ezetimibe  (ZETIA )  fenofibrate  micronized (LOFIBRA)  lamoTRIgine  (LAMICTAL )  metoprolol  succinate (TOPROL -XL)  pantoprazole  (PROTONIX )  rosuvastatin  (CRESTOR )  albuterol  (VENTOLIN  HFA)-if needed (Please bring with you on the day of your surgery) HYDROcodone -acetaminophen  (NORCO/VICODIN)-if needed  As of today, STOP taking any Aspirin  (unless otherwise instructed by your surgeon) Aleve, Naproxen, Ibuprofen, Motrin, Advil, Goody's, BC's, all herbal medications, fish oil, and all vitamins.                      Do not wear jewelry, make up, or nail polish            Do not wear lotions, powders, perfumes/colognes, or deodorant.            Do not shave 48 hours prior to surgery.  Men may shave face and neck.            Do not bring valuables to the hospital.            [facility] is not responsible for any belongings or valuables.  Do NOT Smoke (Tobacco/Vaping) 24 hours prior to your procedure If  you use a CPAP at night, you may bring all equipment for your overnight stay.   Contacts, glasses, dentures or bridgework may not be worn into surgery.      For patients admitted to the hospital, discharge time will be determined by your treatment team.   Patients discharged the day of surgery will not be allowed to drive home, and someone needs to stay with them for 24 hours.    Special instructions:   [place]- Preparing For Surgery  Before surgery, you can play an important role. Because skin is not sterile, your skin needs to be as free of germs as possible. You can reduce the number of germs on your skin by washing with CHG (chlorahexidine gluconate) Soap before surgery.  CHG is an antiseptic cleaner which kills germs and bonds with the skin to continue killing germs even after washing.    Oral Hygiene is also important to reduce your risk of infection.  Remember - BRUSH YOUR TEETH THE MORNING OF SURGERY WITH YOUR REGULAR TOOTHPASTE  Please do not use if you have an allergy to CHG or antibacterial soaps. If your skin becomes reddened/irritated stop using the CHG.  Do not shave (including legs and underarms) for at least 48 hours prior to first CHG shower. It is OK to shave  your face.  Please follow these instructions carefully.   Shower the NIGHT BEFORE SURGERY and the MORNING OF SURGERY with DIAL Soap.   Pat yourself dry with a CLEAN TOWEL.  Wear CLEAN PAJAMAS to bed the night before surgery  Place CLEAN SHEETS on your bed the night of your first shower and DO NOT SLEEP WITH PETS.   Day of Surgery: Please shower morning of surgery  Wear Clean/Comfortable clothing the morning of surgery Do not apply any deodorants/lotions.   Remember to brush your teeth WITH YOUR REGULAR TOOTHPASTE.   Questions were answered. Patient verbalized understanding of instructions.

## 2024-06-15 NOTE — Progress Notes (Signed)
 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 He is s/p wide local excision of bilateral temporal skin lesions on 05/28/24. Right lesion pathology showed residual actinic keratosis and focal residual SCC in situ with free margins. The left lesion showed residual SCC in situ and invasive SCC with deep margin involvement, so re-excision of positive margins planned. (Of note, resection initially done in the OR under anesthesia/LMA/propofol  because he failed in office excision due to claustrophobia.)    History includes never smoker, Multiple sclerosis (diagnosed 1999), CAD (inferior MI, s/p tPA in [place]->CABG: LIMA-LAD, SVG-PDA-PLA, SVG-D1-CX 2003; NSTEMI, s/p redo CABG: RIMA-RCA GSV graft, SVG-DIAG-OM 06/18/20), ischemic cardiomyopathy (LVEF 40-45% 06/17/20->60-65% 02/01/22), HTN, hypercholesterolemia, NAFLD, asthma, dyspnea, neuropathy, GERD, CKD, BPH, mandibular surgery, skin cancer (SCC, SCC in situ 05/2024).   Prior to skin cancer excision on 05/28/24, he had preoperative input by Dr. Delford who felt he was okay to proceed with skin cancer removal at that time and gave permission to hold Plavix  for 5 days if needed.   Appears he was instructed to hold Plavix  at his 06/12/24 visit with plastic surgery APP with plan to resume 1 day after surgery.    Anesthesia team to evaluate on the day of surgery.     VS:  Wt Readings from Last 3 Encounters:  05/28/24 71 kg  05/09/24 67 kg  04/13/24 69.7 kg    BP Readings from Last 3 Encounters:  06/04/24 126/84  05/28/24 (!) 140/71  05/09/24 128/82   Pulse Readings from Last 3 Encounters:  06/04/24 (!) 53  05/28/24 74  05/09/24 (!) 55     PROVIDERS: Claudene Round, MD is PCP  Delford Coy, MD is cardiologist Vear Ade, MD is neurologist. Last visit 03/20/24. He completed 2 years of [employer]. Lamotrigine  prescribed for leg dysesthesias. Plan for lumbar MRI. Return in 7 months.    LABS: For day of surgery. Last lab results in [facility] include: Lab Results  Component Value Date   WBC 5.6 05/28/2024   HGB 13.8 05/28/2024   HCT 40.6 05/28/2024   PLT 144 (L) 05/28/2024   GLUCOSE 89 05/28/2024   ALT 29 05/28/2024   AST 34 05/28/2024   NA 141 05/28/2024   K 4.3 05/28/2024   CL 108 05/28/2024   CREATININE 0.85 05/28/2024   BUN 13 05/28/2024   CO2 23 05/28/2024    IMAGES: MRI Brain 07/11/2023: IMPRESSION:    Scattered T2/FLAIR hyperintense foci in the periventricular and deep white matter of the cerebral hemispheres.  None of the foci enhance or appear to be acute.  Compared to the MRI from 11/11/2016, there were no new lesions.  There are nonspecific, these are consistent with chronic demyelinating plaques associated with multiple sclerosis. No acute findings.  Normal enhancement pattern.   MRI C-spine 07/11/2023: IMPRESSION: T2 hyperintense foci within the spinal cord adjacent to C2-C3 and C5.  These were also noted on the previous MRI from 2018.  They do  not enhance. Minimal disc degenerative changes at C4-C5 and C5-C6 do not lead to spinal stenosis or nerve root compression. Normal enhancement pattern.     EKG: EKG 03/01/2024: Sinus bradycardia. 46 bpm otherwise normal EKG. Compared to 06/19/2020, normal sinus rhythm at the rate of 78 bpm with high lateral T wave inversion suggestive of subendocardial infarct versus ischemia. Confirmed by Ganji, Jagadeesh [phone]) on 03/01/2024 10:04:49 AM     CV: Nuclear stress test  03/14/2024: IMPRESSION: 1. No reversible ischemia or infarction. 2. Normal left ventricular wall motion. Mild LEFT ventricular dilatation 3. Left ventricular ejection fraction 71% 4. Non invasive risk stratification: Low     Echo 02/01/22: IMPRESSIONS   1. Left ventricular ejection fraction, by estimation, is 60 to 65%. Left  ventricular ejection fraction by 3D volume is 65 %. The left ventricle has  normal function. The left ventricle has no regional wall motion  abnormalities. Left ventricular diastolic   parameters were normal.   2. Right ventricular systolic function is normal. The right ventricular  size is mildly enlarged. There is normal pulmonary artery systolic  pressure. The estimated right ventricular systolic pressure is 28.4 mmHg.   3. The mitral valve is normal in structure. Trivial mitral valve  regurgitation. No evidence of mitral stenosis.   4. The aortic valve is tricuspid. Aortic valve regurgitation is not  visualized. Aortic valve sclerosis is present, with no evidence of aortic  valve stenosis.   5. The inferior vena cava is normal in size with greater than 50%  respiratory variability, suggesting right atrial pressure of 3 mmHg.  - Comparison: 8/1/721 LVEF 40-45%, anterior/anterolateral hypokinesis, moderate asymmetric LVH of the basal septal segment, grade 1 DD, normal RVSF, normal PASP, RVSP 23.2 mmHg; 01/20/15 LVEF 55-60%, no RWMA     US  Carotid 06/17/2020: Summary:  - Right Carotid: Velocities in the right ICA are consistent with a 1-39% stenosis. The ECA appears >50% stenosed.  - Left Carotid: Velocities in the left ICA are consistent with a 1-39% stenosis.  - Vertebrals: Bilateral vertebral arteries demonstrate antegrade flow.  - Subclavians: Normal flow hemodynamics were seen in bilateral subclavian arteries.     Past Medical History:  Diagnosis Date   Abnormal CBC    Actinic keratoses    Asthma    Bladder neoplasm    BMI 21.0-21.9, adult    BPH  (benign prostatic hyperplasia)    CAD (coronary artery disease)    s/p Inf STEMI >> S/p CABG in 2003 // Myoview  11/2018: low risk // s/p NSTEMI 8/21 >> s/p redo CABG   Cancer (HCC)    Chronic constipation    Chronic lumbar radiculopathy    Chronic pain of right elbow    CKD (chronic kidney disease)    stage 3   Colon polyp    Colonic obstruction (HCC)    ED (erectile dysfunction)    Erysipelas    Fatigue    GERD (gastroesophageal reflux disease)    Hepatic steatosis    HTN (hypertension)    Hypercholesterolemia    Hypomagnesemia    Impacted cerumen, bilateral    Ischemic cardiomyopathy    Echo 8/21: EF 40-45, anterior/anterolateral HK, moderate asymmetric LVH, GR 1 DD, normal RVSF, RVSP 23.2   Lipids abnormal    Low back pain    Lumbar spinal stenosis    Lymph node enlargement    Mixed hyperlipidemia    Intol of statins (?elevated LFTs)   MRSA (methicillin resistant Staphylococcus aureus)    Multiple  sclerosis (HCC)    Multiple sclerosis (HCC)    Myocardial infarction (HCC)    Neuropathy    Nonalcoholic fatty liver disease    Osteoporosis    Seasonal allergies    Seborrheic dermatitis of scalp    Skin lesion    SOB (shortness of breath)    Tinea corporis    Tinea cruris    Urinary bladder neoplasm    Vitamin D deficiency    Weight loss     Past Surgical History:  Procedure Laterality Date   CORONARY ARTERY BYPASS GRAFT N/A 06/18/2020   Procedure: REDO CORONARY ARTERY BYPASS GRAFTING (CABG), ON PUMP, TIMES THREE, USING RIGHT INTERNAL MAMMARY ARTERY AND ENDOSCOPICALLY HARVESTED LEFT GREATER SAPHENOUS VEIN;  Surgeon: Army Dallas NOVAK, MD;  Location: [facility];  Service: Open Heart Surgery;  Laterality: N/A;  SEQENTIAL DIAG TO OM RIMA TO PREVIOUS DISTAL RIGHT VEIN GRAFT   ENDOVEIN HARVEST OF GREATER SAPHENOUS VEIN Left 06/18/2020   Procedure: ENDOVEIN HARVEST OF GREATER SAPHENOUS VEIN;  Surgeon: Army Dallas NOVAK, MD;  Location: [facility] OR;  Service: Open Heart Surgery;   Laterality: Left;   EXCISION MASS HEAD N/A 05/28/2024   Procedure: EXCISION, MASS, HEAD;  Surgeon: Waddell Leonce NOVAK, MD;  Location: [facility] OR;  Service: Plastics;  Laterality: N/A;  Excision of bilateral temple skin lesions   LEFT HEART CATH AND CORS/GRAFTS ANGIOGRAPHY N/A 06/16/2020   Procedure: LEFT HEART CATH AND CORS/GRAFTS ANGIOGRAPHY;  Surgeon: Claudene Victory ORN, MD;  Location: [facility] INVASIVE CV LAB;  Service: Cardiovascular;  Laterality: N/A;   MANDIBLE SURGERY     TEE WITHOUT CARDIOVERSION N/A 06/18/2020   Procedure: TRANSESOPHAGEAL ECHOCARDIOGRAM (TEE);  Surgeon: Army Dallas NOVAK, MD;  Location: [facility] OR;  Service: Open Heart Surgery;  Laterality: N/A;    MEDICATIONS: No current facility-administered medications for this encounter.    albuterol  (VENTOLIN  HFA) 108 (90 Base) MCG/ACT inhaler   alendronate (FOSAMAX) 70 MG tablet   Aspirin -Salicylamide-Caffeine (BC HEADACHE POWDER PO)   cholecalciferol (VITAMIN D3) 25 MCG (1000 UNIT) tablet   clopidogrel  (PLAVIX ) 75 MG tablet   ezetimibe  (ZETIA ) 10 MG tablet   fenofibrate  micronized (LOFIBRA) 134 MG capsule   HYDROcodone -acetaminophen  (NORCO/VICODIN) 5-325 MG tablet   lamoTRIgine  (LAMICTAL ) 25 MG tablet   losartan  (COZAAR ) 25 MG tablet   metoprolol  succinate (TOPROL -XL) 25 MG 24 hr tablet   nitroGLYCERIN  (NITROSTAT ) 0.4 MG SL tablet   pantoprazole  (PROTONIX ) 40 MG tablet   rosuvastatin  (CRESTOR ) 10 MG tablet   HYDROcodone -acetaminophen  (NORCO/VICODIN) 5-325 MG tablet   ondansetron  (ZOFRAN ) 4 MG tablet    Isaiah Ruder, PA-C Surgical Short Stay/Anesthesiology [facility] Phone [phone] [facility] Phone [phone] 06/15/2024 11:16 AM

## 2024-06-18 ENCOUNTER — Encounter (HOSPITAL_COMMUNITY)
Admission: RE | Disposition: A | Discharge status: Home / Self Care | Payer: Self-pay | Source: Home / Self Care | Attending: Plastic Surgery

## 2024-06-18 ENCOUNTER — Ambulatory Visit (HOSPITAL_COMMUNITY)
Admission: RE | Admit: 2024-06-18 | Discharge: 2024-06-18 | Disposition: A | Discharge status: Home / Self Care | Attending: Plastic Surgery | Admitting: Plastic Surgery

## 2024-06-18 ENCOUNTER — Other Ambulatory Visit: Payer: Self-pay

## 2024-06-18 ENCOUNTER — Encounter (HOSPITAL_COMMUNITY): Payer: Self-pay | Admitting: Plastic Surgery

## 2024-06-18 ENCOUNTER — Ambulatory Visit (HOSPITAL_COMMUNITY): Payer: Self-pay | Admitting: Vascular Surgery

## 2024-06-18 ENCOUNTER — Encounter: Admitting: Surgical

## 2024-06-18 DIAGNOSIS — K219 Gastro-esophageal reflux disease without esophagitis: Secondary | ICD-10-CM | POA: Insufficient documentation

## 2024-06-18 DIAGNOSIS — I10 Essential (primary) hypertension: Secondary | ICD-10-CM | POA: Diagnosis not present

## 2024-06-18 DIAGNOSIS — I2510 Atherosclerotic heart disease of native coronary artery without angina pectoris: Secondary | ICD-10-CM

## 2024-06-18 DIAGNOSIS — Z951 Presence of aortocoronary bypass graft: Secondary | ICD-10-CM | POA: Insufficient documentation

## 2024-06-18 DIAGNOSIS — D0439 Carcinoma in situ of skin of other parts of face: Secondary | ICD-10-CM

## 2024-06-18 DIAGNOSIS — J45909 Unspecified asthma, uncomplicated: Secondary | ICD-10-CM | POA: Diagnosis not present

## 2024-06-18 DIAGNOSIS — C44320 Squamous cell carcinoma of skin of unspecified parts of face: Secondary | ICD-10-CM

## 2024-06-18 DIAGNOSIS — N183 Chronic kidney disease, stage 3 unspecified: Secondary | ICD-10-CM | POA: Diagnosis not present

## 2024-06-18 DIAGNOSIS — I255 Ischemic cardiomyopathy: Secondary | ICD-10-CM | POA: Insufficient documentation

## 2024-06-18 DIAGNOSIS — K760 Fatty (change of) liver, not elsewhere classified: Secondary | ICD-10-CM | POA: Diagnosis not present

## 2024-06-18 DIAGNOSIS — G35 Multiple sclerosis: Secondary | ICD-10-CM | POA: Diagnosis not present

## 2024-06-18 DIAGNOSIS — E782 Mixed hyperlipidemia: Secondary | ICD-10-CM | POA: Insufficient documentation

## 2024-06-18 DIAGNOSIS — Z7902 Long term (current) use of antithrombotics/antiplatelets: Secondary | ICD-10-CM | POA: Diagnosis not present

## 2024-06-18 DIAGNOSIS — I1310 Hypertensive heart and chronic kidney disease without heart failure, with stage 1 through stage 4 chronic kidney disease, or unspecified chronic kidney disease: Secondary | ICD-10-CM | POA: Insufficient documentation

## 2024-06-18 DIAGNOSIS — I252 Old myocardial infarction: Secondary | ICD-10-CM | POA: Diagnosis not present

## 2024-06-18 HISTORY — PX: EXCISION MASS HEAD: SHX6702

## 2024-06-18 HISTORY — PX: COMPLEX WOUND CLOSURE: SHX6446

## 2024-06-18 HISTORY — PX: APPLICATION, SKIN SUBSTITUTE: SHX7530

## 2024-06-18 SURGERY — EXCISION, MASS, HEAD
Anesthesia: General | Site: Head | Laterality: Left

## 2024-06-18 MED ORDER — CEFAZOLIN SODIUM-DEXTROSE 2-4 GM/100ML-% IV SOLN
2.0000 g | INTRAVENOUS | Status: AC
  Administered 2024-06-18: 2 g via INTRAVENOUS
  Filled 2024-06-18: qty 100

## 2024-06-18 MED ORDER — OXYCODONE HCL 5 MG/5ML PO SOLN: 5.0000 mg | Freq: Once | ORAL | Status: DC | PRN

## 2024-06-18 MED ORDER — 0.9 % SODIUM CHLORIDE (POUR BTL) OPTIME
TOPICAL | Status: DC | PRN
  Administered 2024-06-18: 1000 mL

## 2024-06-18 MED ORDER — PHENYLEPHRINE HCL-NACL 20-0.9 MG/250ML-% IV SOLN
INTRAVENOUS | Status: DC | PRN
Start: 2024-06-18 — End: 2024-06-18
  Administered 2024-06-18: 25 ug/min via INTRAVENOUS

## 2024-06-18 MED ORDER — FENTANYL CITRATE (PF) 100 MCG/2ML IJ SOLN: 25.0000 ug | INTRAMUSCULAR | Status: DC | PRN

## 2024-06-18 MED ORDER — LIDOCAINE-EPINEPHRINE (PF) 1 %-1:200000 IJ SOLN
INTRAMUSCULAR | Status: AC
  Filled 2024-06-18: qty 30

## 2024-06-18 MED ORDER — EPHEDRINE SULFATE-NACL 50-0.9 MG/10ML-% IV SOSY
PREFILLED_SYRINGE | INTRAVENOUS | Status: DC | PRN
  Administered 2024-06-18 (×2): 5 mg via INTRAVENOUS

## 2024-06-18 MED ORDER — FENTANYL CITRATE (PF) 250 MCG/5ML IJ SOLN
INTRAMUSCULAR | Status: DC | PRN
  Administered 2024-06-18: 50 ug via INTRAVENOUS

## 2024-06-18 MED ORDER — OXYCODONE HCL 5 MG PO TABS: 5.0000 mg | ORAL_TABLET | Freq: Once | ORAL | Status: DC | PRN

## 2024-06-18 MED ORDER — ONDANSETRON HCL 4 MG/2ML IJ SOLN
INTRAMUSCULAR | Status: DC | PRN
  Administered 2024-06-18: 4 mg via INTRAVENOUS

## 2024-06-18 MED ORDER — LIDOCAINE 2% (20 MG/ML) 5 ML SYRINGE
INTRAMUSCULAR | Status: AC
  Filled 2024-06-18: qty 5

## 2024-06-18 MED ORDER — DEXAMETHASONE SODIUM PHOSPHATE 10 MG/ML IJ SOLN
INTRAMUSCULAR | Status: AC
  Filled 2024-06-18: qty 1

## 2024-06-18 MED ORDER — CHLORHEXIDINE GLUCONATE 0.12 % MT SOLN
15.0000 mL | Freq: Once | OROMUCOSAL | Status: AC
  Administered 2024-06-18: 15 mL via OROMUCOSAL

## 2024-06-18 MED ORDER — BUPIVACAINE-EPINEPHRINE (PF) 0.25% -1:200000 IJ SOLN
INTRAMUSCULAR | Status: AC
  Filled 2024-06-18: qty 30

## 2024-06-18 MED ORDER — BUPIVACAINE-EPINEPHRINE 0.25% -1:200000 IJ SOLN
INTRAMUSCULAR | Status: DC | PRN
Start: 2024-06-18 — End: 2024-06-18
  Administered 2024-06-18: 10 mL

## 2024-06-18 MED ORDER — ONDANSETRON HCL 4 MG/2ML IJ SOLN
INTRAMUSCULAR | Status: AC
  Filled 2024-06-18: qty 2

## 2024-06-18 MED ORDER — FENTANYL CITRATE (PF) 250 MCG/5ML IJ SOLN
INTRAMUSCULAR | Status: AC
  Filled 2024-06-18: qty 5

## 2024-06-18 MED ORDER — PROPOFOL 10 MG/ML IV BOLUS
INTRAVENOUS | Status: DC | PRN
  Administered 2024-06-18: 150 mg via INTRAVENOUS

## 2024-06-18 MED ORDER — PROPOFOL 10 MG/ML IV BOLUS
INTRAVENOUS | Status: AC
  Filled 2024-06-18: qty 20

## 2024-06-18 MED ORDER — CHLORHEXIDINE GLUCONATE CLOTH 2 % EX PADS: 6.0000 | MEDICATED_PAD | Freq: Once | CUTANEOUS | Status: DC

## 2024-06-18 MED ORDER — DEXAMETHASONE SODIUM PHOSPHATE 10 MG/ML IJ SOLN
INTRAMUSCULAR | Status: DC | PRN
  Administered 2024-06-18: 10 mg via INTRAVENOUS

## 2024-06-18 MED ORDER — ACETAMINOPHEN 10 MG/ML IV SOLN: 1000.0000 mg | Freq: Once | INTRAVENOUS | Status: DC | PRN

## 2024-06-18 MED ORDER — MIDAZOLAM HCL 2 MG/2ML IJ SOLN
INTRAMUSCULAR | Status: AC
  Filled 2024-06-18: qty 2

## 2024-06-18 MED ORDER — LACTATED RINGERS IV SOLN: INTRAVENOUS | Status: DC

## 2024-06-18 MED ORDER — MIDAZOLAM HCL 2 MG/2ML IJ SOLN
INTRAMUSCULAR | Status: DC | PRN
  Administered 2024-06-18: 2 mg via INTRAVENOUS

## 2024-06-18 MED ORDER — ORAL CARE MOUTH RINSE: 15.0000 mL | Freq: Once | OROMUCOSAL | Status: AC

## 2024-06-18 MED ORDER — ONDANSETRON HCL 4 MG/2ML IJ SOLN: 4.0000 mg | Freq: Once | INTRAMUSCULAR | Status: DC | PRN

## 2024-06-18 MED ORDER — LIDOCAINE 2% (20 MG/ML) 5 ML SYRINGE
INTRAMUSCULAR | Status: DC | PRN
  Administered 2024-06-18: 100 mg via INTRAVENOUS

## 2024-06-18 SURGICAL SUPPLY — 34 items
APPLICATOR COTTON TIP 6 STRL (MISCELLANEOUS) IMPLANT
BLADE SURG 15 STRL LF DISP TIS (BLADE) ×1 IMPLANT
CANISTER SUCTION 3000ML PPV (SUCTIONS) IMPLANT
CNTNR URN SCR LID CUP LEK RST (MISCELLANEOUS) IMPLANT
DERMABOND ADVANCED .7 DNX12 (GAUZE/BANDAGES/DRESSINGS) IMPLANT
DRAPE LAPAROTOMY 100X72 PEDS (DRAPES) IMPLANT
DRSG CUTIMED SORBACT 7X9 (GAUZE/BANDAGES/DRESSINGS) IMPLANT
ELECT COATED BLADE 2.86 ST (ELECTRODE) IMPLANT
ELECTRODE REM PT RTRN 9FT ADLT (ELECTROSURGICAL) ×1 IMPLANT
GAUZE SPONGE 4X4 12PLY STRL (GAUZE/BANDAGES/DRESSINGS) IMPLANT
GAUZE SPONGE 4X4 12PLY STRL LF (GAUZE/BANDAGES/DRESSINGS) ×1 IMPLANT
GLOVE BIO SURGEON STRL SZ8 (GLOVE) ×1 IMPLANT
GLOVE BIOGEL PI IND STRL 8 (GLOVE) ×1 IMPLANT
GOWN STRL REUS W/ TWL LRG LVL3 (GOWN DISPOSABLE) ×3 IMPLANT
GRAFT MYRIAD 3 LAYER 5X5 (Graft) IMPLANT
KIT BASIN OR (CUSTOM PROCEDURE TRAY) ×1 IMPLANT
NDL HYPO 25GX1X1/2 BEV (NEEDLE) ×1 IMPLANT
NEEDLE HYPO 25GX1X1/2 BEV (NEEDLE) ×1 IMPLANT
NS IRRIG 1000ML POUR BTL (IV SOLUTION) ×1 IMPLANT
PACK SRG BSC III STRL LF ECLPS (CUSTOM PROCEDURE TRAY) ×1 IMPLANT
PENCIL BUTTON HOLSTER BLD 10FT (ELECTRODE) ×1 IMPLANT
SPONGE T-LAP 18X18 ~~LOC~~+RFID (SPONGE) ×2 IMPLANT
STAPLER SKIN PROX 35W (STAPLE) IMPLANT
SUCTION TUBE FRAZIER 10FR DISP (SUCTIONS) IMPLANT
SUT MNCRL AB 4-0 PS2 18 (SUTURE) IMPLANT
SUT MON AB 3-0 SH27 (SUTURE) IMPLANT
SUT PROLENE 4 0 PS 2 18 (SUTURE) IMPLANT
SUT PROLENE 5 0 PS 2 (SUTURE) IMPLANT
SUT SILK 2 0 SH (SUTURE) ×1 IMPLANT
SYR BULB EAR ULCER 3OZ GRN STR (SYRINGE) ×1 IMPLANT
SYR CONTROL 10ML LL (SYRINGE) ×1 IMPLANT
TOWEL GREEN STERILE FF (TOWEL DISPOSABLE) ×1 IMPLANT
TUBE CONNECTING 12X1/4 (SUCTIONS) ×1 IMPLANT
YANKAUER SUCT BULB TIP NO VENT (SUCTIONS) ×1 IMPLANT

## 2024-06-18 NOTE — Interval H&P Note (Signed)
 History and Physical Interval Note: No change in exam or indication for surgery No questions Left temporal scar marked with his concurrence Will proceed at his request  06/18/2024 12:13 PM  Gregory Gardner  has presented today for surgery, with the diagnosis of C44.320.  The various methods of treatment have been discussed with the patient and family. After consideration of risks, benefits and other options for treatment, the patient has consented to  Procedure(s) with comments: EXCISION, MASS, HEAD (N/A) - Reexcision left temple skin lesion, complex closure, possible myriad placement COMPLEX CLOSURE, WOUND (N/A) APPLICATION, SKIN SUBSTITUTE (N/A) - possible myriad placement as a surgical intervention.  The patient's history has been reviewed, patient examined, no change in status, stable for surgery.  I have reviewed the patient's chart and labs.  Questions were answered to the patient's satisfaction.     Leonce KATHEE Birmingham

## 2024-06-18 NOTE — Anesthesia Procedure Notes (Signed)
 Procedure Name: LMA Insertion Date/Time: 06/18/2024 11:47 AM  Performed by: Matos, Enoc C, CRNAPre-anesthesia Checklist: Patient identified, Emergency Drugs available, Suction available and Patient being monitored Patient Re-evaluated:Patient Re-evaluated prior to induction Oxygen Delivery Method: Circle System Utilized Preoxygenation: Pre-oxygenation with 100% oxygen Induction Type: IV induction Ventilation: Mask ventilation without difficulty LMA: LMA inserted LMA Size: 4.0 Number of attempts: 1 Placement Confirmation: positive ETCO2 and breath sounds checked- equal and bilateral Tube secured with: Tape Dental Injury: Teeth and Oropharynx as per pre-operative assessment

## 2024-06-18 NOTE — Op Note (Signed)
 DATE OF OPERATION: 06/18/2024  LOCATION: [facility] Main operating Room  PREOPERATIVE DIAGNOSIS: Squamous cell carcinoma, left temple  POSTOPERATIVE DIAGNOSIS: Same  PROCEDURE: Reexcision for positive margins  SURGEON: Marinell Birmingham, MD  ASSISTANT: Matt Scheeler  EBL: 10 cc  CONDITION: Stable  COMPLICATIONS: None  INDICATION: The patient, Gregory Gardner, is a 63 y.o. male born on [birth year], is here for treatment reexcision of left temple squamous cell carcinoma after positive margins.   PROCEDURE DETAILS:  The patient was seen prior to surgery and marked.  The IV antibiotics were given. The patient was taken to the operating room and given a general anesthetic. A standard time out was performed and all information was confirmed by those in the room. SCDs were placed.   The left temporal region was prepped and draped in the usual sterile manner.  I marked the entire scar with a 5 mm margin I then infiltrated the tissues with quarter percent Marcaine  with epinephrine .  The previous scar was excised and marked with a short suture at 12:00 or superior and a long suture at 3 PM or lateral.  This was sent to pathology for examination.  I undermined the entire wound for distance of approximately 1 cm and attempted to close it primarily.  The entire length of the incision was 5 cm.  I was able to close 1 cm on the medial and 1 cm on the lateral aspect.  This was done with interrupted four 3-0 Monocryl's and 5-0 Prolene's and the skin.  The 3 cm in the center was under too much tension to close primarily and I elected to place a 5 x 5 cm portion of myriad tissue replacement matrix.  This was sutured in place with interrupted 4-0 Monocryl sutures.  This was then covered with a portion of Adaptic which was sutured in place with Prolene sutures.  Dressings of water-based lubricant and moistened gauze were then placed The patient was allowed to wake up and taken to recovery room in stable condition at the end of  the case. The family was notified at the end of the case.   The advanced practice practitioner (APP) assisted throughout the case.  The APP was essential in retraction and counter traction when needed to make the case progress smoothly.  This retraction and assistance made it possible to see the tissue plans for the procedure.  The assistance was needed for blood control, tissue re-approximation and assisted with closure of the incision site.

## 2024-06-18 NOTE — Anesthesia Postprocedure Evaluation (Signed)
 Anesthesia Post Note  Patient: Gregory Gardner  Procedure(s) Performed: EXCISION, MASS, HEAD (Left: Head) COMPLEX CLOSURE, WOUND (Left: Head) APPLICATION, SKIN SUBSTITUTE (Left: Head)     Patient location during evaluation: PACU Anesthesia Type: General Level of consciousness: awake and alert Pain management: pain level controlled Vital Signs Assessment: post-procedure vital signs reviewed and stable Respiratory status: spontaneous breathing, nonlabored ventilation, respiratory function stable and patient connected to nasal cannula oxygen Cardiovascular status: blood pressure returned to baseline and stable Postop Assessment: no apparent nausea or vomiting Anesthetic complications: no   No notable events documented.  Last Vitals:  Vitals:   06/18/24 1430 06/18/24 1445  BP: 130/65 116/62  Pulse: 66 (!) 56  Resp: 11 10  Temp:    SpO2: 99% 98%    Last Pain:  Vitals:   06/18/24 1445  TempSrc:   PainSc: 0-No pain                 Rome Ade

## 2024-06-18 NOTE — Discharge Instructions (Signed)
 Leave dressings in place until Wednesday morning Dressing should consist of: Water-based lubricant such as KY immediately on the gauze covered with a dampened gauze and a dry dressing of choice.  This should be done once a day and anytime the dressing appears dry.  May shower starting on Wednesday  Diet of choice  Call office for any questions or concerns.  [phone]

## 2024-06-18 NOTE — Transfer of Care (Signed)
 Immediate Anesthesia Transfer of Care Note  Patient: Gregory Gardner  Procedure(s) Performed: EXCISION, MASS, HEAD (Left: Head) COMPLEX CLOSURE, WOUND (Left: Head) APPLICATION, SKIN SUBSTITUTE (Left: Head)  Patient Location: PACU  Anesthesia Type:General  Level of Consciousness: awake, alert , and sedated  Airway & Oxygen Therapy: Patient Spontanous Breathing and Patient connected to face mask oxygen  Post-op Assessment: Report given to RN and Post -op Vital signs reviewed and stable  Post vital signs: Reviewed and stable  Last Vitals:  Vitals Value Taken Time  BP 121/58 06/18/24 14:22  Temp    Pulse 68 06/18/24 14:25  Resp 14 06/18/24 14:25  SpO2 100 % 06/18/24 14:25  Vitals shown include unfiled device data.  Last Pain:  Vitals:   06/18/24 0922  TempSrc:   PainSc: 0-No pain         Complications: No notable events documented.

## 2024-06-19 ENCOUNTER — Encounter (HOSPITAL_COMMUNITY): Payer: Self-pay | Admitting: Plastic Surgery

## 2024-06-20 LAB — SURGICAL PATHOLOGY

## 2024-06-25 ENCOUNTER — Ambulatory Visit (INDEPENDENT_AMBULATORY_CARE_PROVIDER_SITE_OTHER): Admitting: Plastic Surgery

## 2024-06-25 VITALS — BP 131/74 | HR 54

## 2024-06-25 DIAGNOSIS — D049 Carcinoma in situ of skin, unspecified: Secondary | ICD-10-CM

## 2024-06-25 DIAGNOSIS — C44320 Squamous cell carcinoma of skin of unspecified parts of face: Secondary | ICD-10-CM

## 2024-06-25 NOTE — Progress Notes (Signed)
 Gregory Gardner returns today 1 week after reexcision of the squamous cell cancer on the left temple region.  He is doing well with no specific complaints.  Dressings were removed today and the Adaptic is still in place.  The incision under the Adaptic looks good.  Continue dressings over the Adaptic for another 3 to 4 days.  Will remove the Adaptic this week and continue dressing changes across the top of the myriad.  Pathology was reviewed with Gregory Gardner.  There was no residual carcinoma in the tissue excised.  He will need follow-up with dermatology.  Will place this consult.

## 2024-06-27 NOTE — Progress Notes (Signed)
 Patient is a pleasant 63 year old male s/p reexcision left temple SCC performed 06/18/2024 by Dr. Waddell who presents to clinic for postoperative follow-up.  He was last seen here in clinic on 06/25/2024.  At that time, referral was placed to dermatology and pathology was reviewed.  Plan was for removal of the Adaptic dressings in a few days.  Today,

## 2024-06-28 ENCOUNTER — Ambulatory Visit: Admitting: Physician Assistant

## 2024-06-28 VITALS — BP 109/59 | HR 72

## 2024-06-28 DIAGNOSIS — C44320 Squamous cell carcinoma of skin of unspecified parts of face: Secondary | ICD-10-CM

## 2024-07-06 ENCOUNTER — Telehealth: Payer: Self-pay | Admitting: Student

## 2024-07-06 NOTE — Telephone Encounter (Signed)
 Patient has a toothache that he feels is leading up to incision area of procedure and is worried about infection, please reach out and advise

## 2024-07-06 NOTE — Telephone Encounter (Signed)
 I called the patient in regards to his call to our office.  He states that he developed a toothache today and was concerned about his procedure site.  He states that he was unable to get in with the dentist.  He denies any drainage from the procedure site.  He denies any fevers or chills.  He does not report any redness around the area.  I discussed with the patient that he should be evaluated at urgent care and emergency room for his tooth pain today.  I discussed with him that based on his symptoms, it does not sound like the surgical site itself is infected.  I recommended he continue with dressing changes as he has been and keep his appointment with us  on Tuesday.  I discussed with him that he needs to be evaluated for his tooth ache today though.  He expressed understanding.

## 2024-07-10 ENCOUNTER — Encounter: Payer: Self-pay | Admitting: Student

## 2024-07-10 ENCOUNTER — Ambulatory Visit (INDEPENDENT_AMBULATORY_CARE_PROVIDER_SITE_OTHER): Admitting: Student

## 2024-07-10 ENCOUNTER — Encounter: Admitting: Physician Assistant

## 2024-07-10 VITALS — BP 139/87 | HR 51 | Resp 99 | Ht 71.0 in | Wt 149.0 lb

## 2024-07-10 DIAGNOSIS — S0180XD Unspecified open wound of other part of head, subsequent encounter: Secondary | ICD-10-CM

## 2024-07-10 DIAGNOSIS — C44329 Squamous cell carcinoma of skin of other parts of face: Secondary | ICD-10-CM | POA: Diagnosis not present

## 2024-07-10 DIAGNOSIS — C44320 Squamous cell carcinoma of skin of unspecified parts of face: Secondary | ICD-10-CM

## 2024-07-10 NOTE — Progress Notes (Signed)
 Patient is a 63 year old male who recently underwent reexcision of left temple squamous cell carcinoma with Dr. Waddell in 06/18/2024.  He is approximately 3 weeks postop.  He presents to the clinic today for postoperative follow-up.     Patient was last seen in the clinic on 06/28/2024.  At this visit, patient was doing well.  On exam, Adaptic dressing was removed without any complication or bleeding.  He had partial closure of his excision site with Prolene sutures which were removed.  The skin edges were well-approximated and appropriately healed.  There is a moderate amount of unincorporated myriad in the wound bed.    Today, patient reports he is doing well.  He reports he is still trying to get in with a dentist for his toothache, but reports the pain has been improving.  He denies any fevers or chills.  He does state that he has been having some difficulty with the dressing changes as whenever he takes the dressing off, it pulls at the scab.    On exam, patient is sitting upright in no acute distress.  Bandage was removed over the left temple.  Over the left temple, there was nonviable tissue/blackened unincorporated myriad sitting over the wound.  This was debrided with scissors and patient tolerated well.  Wound is approximately 2.5 x 1.5 x 0.25 cm in size.  There appears to be some healthy appearing tissue throughout the wound with some exudate noted as well.  There is no surrounding erythema or drainage.  No overt signs of infection.  I recommended that patient place a Vashe soaked gauze onto the wound for 5 minutes, remove the Vashe soaked gauze and apply Xeroform followed by a bandage daily.  We will order supplies through [facility] .  Patient expressed understanding.  I would like the patient to follow back up next week.  I instructed him to call in the meantime if he has any questions or concerns about anything.  Pictures were obtained of the patient and placed in the chart with the patient's  or guardian's permission.

## 2024-07-11 ENCOUNTER — Telehealth: Payer: Self-pay | Admitting: *Deleted

## 2024-07-11 NOTE — Telephone Encounter (Addendum)
 Faxed order,demographics,insurance information,and recent office notes to [facility] [employer] [facility]  For supplies for the patient.    Supplies:Xeroform-Daily                Mepilex Border-Daily  Confirmation received and copy scanned into the chart.//AR/CMA

## 2024-07-11 NOTE — Addendum Note (Signed)
 Addended by: ANDRIS STAGGER on: 07/11/2024 01:22 PM   Modules accepted: Level of Service

## 2024-07-16 ENCOUNTER — Other Ambulatory Visit: Payer: Self-pay | Admitting: Neurology

## 2024-07-16 NOTE — Telephone Encounter (Signed)
 Last seen on 03/20/24 Follow up scheduled on 10/18/24  Rx pending to be signed

## 2024-07-18 ENCOUNTER — Encounter: Payer: Self-pay | Admitting: Student

## 2024-07-18 ENCOUNTER — Ambulatory Visit (INDEPENDENT_AMBULATORY_CARE_PROVIDER_SITE_OTHER): Admitting: Student

## 2024-07-18 VITALS — BP 148/75 | HR 48 | Ht 70.0 in | Wt 149.8 lb

## 2024-07-18 DIAGNOSIS — C44320 Squamous cell carcinoma of skin of unspecified parts of face: Secondary | ICD-10-CM

## 2024-07-18 DIAGNOSIS — Z85828 Personal history of other malignant neoplasm of skin: Secondary | ICD-10-CM

## 2024-07-18 DIAGNOSIS — S0180XD Unspecified open wound of other part of head, subsequent encounter: Secondary | ICD-10-CM

## 2024-07-18 NOTE — Progress Notes (Signed)
   Referring Provider Claudene Round, MD [address] [place],  [place] [address]   CC:  Chief Complaint  Patient presents with   Post-op Follow-up      Gregory Gardner is an 63 y.o. male.  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 Waddell in 06/18/2024.  He is a little over 4 weeks postop.  He presents to the clinic today for postoperative follow-up.  Patient was last seen in the clinic on 07/10/2024.  At this visit, patient was doing well.  On exam, there is nonviable tissue noted over the wound.  This was debrided.  The wound was approximately 2.5 x 1.5 x 0.25 cm in size.  There is some healthy tissue throughout the wound with some exudate noticed as well.  It is recommended that patient apply Vashe soaked gauze onto the wound for 5 minutes daily and remove the gauze and apply Xeroform and a bandage daily.  Today, patient reports he is doing well.  He states that there is a little bit of blood on the gauze when he is cleaning with the Vashe, otherwise denies any new issues or concerns.  He denies any fevers or chills.  He denies any other significant drainage.  Review of Systems General: Denies any fevers or chills  Physical Exam    07/18/2024    8:17 AM 07/10/2024    2:17 PM 06/28/2024   11:30 AM  Vitals with BMI  Height 5' 10 5' 11   Weight 149 lbs 13 oz 149 lbs   BMI 21.49 20.79   Systolic 148 139 890  Diastolic 75 87 59  Pulse 48 51 72    General:  No acute distress,  Alert and oriented, Non-Toxic, Normal speech and affect Chaperone present on exam.  On exam, patient is sitting upright in no acute distress.  Wound to the left temple appears to have filled in nicely since previous visit.  There appears to be healthy tissue throughout the majority of the wound, there is a little bit of exudate noted.  There is no active drainage on exam.  No surrounding erythema or drainage.  No signs of infection.  Wound is  approximately 2.5 x 1.5 cm.  Assessment/Plan  Squamous cell carcinoma of face   I discussed with the patient that it appears his wound is healing nicely and I recommend that he continue with the dressing changes as he has been.  Patient expressed understanding.  We will plan to see him back in about 2 to 3 weeks.  I instructed him to call if he has any questions or concerns about anything.  Pictures were obtained of the patient and placed in the chart with the patient's or guardian's permission.   Estefana FORBES Peck 07/18/2024, 8:36 AM

## 2024-08-06 ENCOUNTER — Encounter: Admitting: Student

## 2024-08-08 ENCOUNTER — Ambulatory Visit (INDEPENDENT_AMBULATORY_CARE_PROVIDER_SITE_OTHER): Admitting: Student

## 2024-08-08 ENCOUNTER — Encounter: Payer: Self-pay | Admitting: Student

## 2024-08-08 VITALS — BP 110/66 | HR 52 | Temp 97.5°F | Ht 70.0 in | Wt 152.3 lb

## 2024-08-08 DIAGNOSIS — C44320 Squamous cell carcinoma of skin of unspecified parts of face: Secondary | ICD-10-CM

## 2024-08-08 NOTE — Progress Notes (Signed)
   Referring Provider Claudene Round, MD [address] [place],  [place] [address]   CC:  Chief Complaint  Patient presents with   Post-op Follow-up     POSTOP:Reexcision left temple skin lesion, complex closure, possible myriad cement/// INS//SX//06-18-24//[facility] /130PM 2-3 weeks with PA       Gregory Gardner is an 63 y.o. male.  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 Waddell in 06/18/2024.  He is about 2.5 months postop.  He presents to the clinic today for postoperative follow-up.   Patient was last seen in the clinic on 07/18/2024.  At this visit, wound to the left temple appeared to have filled in nicely since the previous visit.  It was approximately 2.5 x 1.5 cm.  Today, patient reports he is doing well.  He denies any new issues or concerns with his wound.  Denies any fevers or chills.  Denies any significant drainage.  He reports that he has been applying dressings daily.  Review of Systems General: Denies any fevers or chills  Physical Exam    08/08/2024   10:28 AM 07/18/2024    8:17 AM 07/10/2024    2:17 PM  Vitals with BMI  Height 5' 10 5' 10 5' 11  Weight 152 lbs 5 oz 149 lbs 13 oz 149 lbs  BMI 21.85 21.49 20.79  Systolic 110 148 860  Diastolic 66 75 87  Pulse 52 48 51    General:  No acute distress,  Alert and oriented, Non-Toxic, Normal speech and affect On exam, patient is sitting upright in no acute distress.  Wound appears to be healing well with much improved epithelialization.  It does appear to be smaller than previous exam.  There is no surrounding erythema or drainage.  No signs of infection.  Assessment/Plan  Squamous cell carcinoma of face   Recommended that patient continue to clean the wound with Vashe and continue with Xeroform daily.  Patient expressed understanding.  Patient to follow back up in about 1 month.  Instructed him to call if he has any questions or  concerns about anything.  Pictures were obtained of the patient and placed in the chart with the patient's or guardian's permission.    Gregory Gardner 08/08/2024, 2:19 PM

## 2024-08-20 ENCOUNTER — Other Ambulatory Visit: Payer: Self-pay | Admitting: Neurology

## 2024-09-05 ENCOUNTER — Ambulatory Visit (INDEPENDENT_AMBULATORY_CARE_PROVIDER_SITE_OTHER): Admitting: Plastic Surgery

## 2024-09-05 ENCOUNTER — Encounter: Payer: Self-pay | Admitting: Plastic Surgery

## 2024-09-05 ENCOUNTER — Encounter: Admitting: Plastic Surgery

## 2024-09-05 VITALS — BP 147/76 | HR 62 | Temp 98.5°F | Wt 150.6 lb

## 2024-09-05 DIAGNOSIS — L905 Scar conditions and fibrosis of skin: Secondary | ICD-10-CM | POA: Diagnosis not present

## 2024-09-05 DIAGNOSIS — Z85828 Personal history of other malignant neoplasm of skin: Secondary | ICD-10-CM

## 2024-09-05 DIAGNOSIS — C44320 Squamous cell carcinoma of skin of unspecified parts of face: Secondary | ICD-10-CM

## 2024-09-05 NOTE — Progress Notes (Signed)
 Gregory Gardner returns today for continued follow-up after excision and reexcision of a squamous cell carcinoma on the left temple region.  He is overall doing well.  The wound is a very small scar at this time.  Submental and preauricular nodes were evaluated and no lymphadenopathy was appreciated.  He is still waiting on a follow-up appointment with dermatology for further recommendations and for full-body screening.  I have encouraged him to use moisturizing cream and sunscreen at all times while he is outside especially on his face head and arms.  Follow-up in 2 months

## 2024-09-25 ENCOUNTER — Telehealth (HOSPITAL_BASED_OUTPATIENT_CLINIC_OR_DEPARTMENT_OTHER): Payer: Self-pay

## 2024-09-25 NOTE — Telephone Encounter (Signed)
   Pre-operative Risk Assessment    Patient Name: Gregory Gardner  DOB: 25-Feb-1961 MRN: [id number]   Date of last office visit: 04/16/2024 with Dr. Delford   Date of next office visit: None  Request for Surgical Clearance    Procedure:  Dental Extraction - Amount of Teeth to be Pulled:  One surgical extraction of #18  Date of Surgery:  Clearance TBD                                 Surgeon:  Does not specify Surgeon's Group or Practice Name:  [facility]  Phone number:  [phone] Fax number:  [phone]   Type of Clearance Requested:   - Medical  - Pharmacy:  Hold Clopidogrel  (Plavix ) -Needs instruction   Type of Anesthesia:  Local with epinephrine    Additional requests/questions:  None  Signed, Patrcia Iverson CROME   09/25/2024, 4:46 PM

## 2024-09-26 ENCOUNTER — Telehealth: Payer: Self-pay | Admitting: Cardiovascular Disease

## 2024-09-26 ENCOUNTER — Ambulatory Visit (INDEPENDENT_AMBULATORY_CARE_PROVIDER_SITE_OTHER): Admitting: Dermatology

## 2024-09-26 ENCOUNTER — Encounter: Payer: Self-pay | Admitting: Dermatology

## 2024-09-26 DIAGNOSIS — W908XXA Exposure to other nonionizing radiation, initial encounter: Secondary | ICD-10-CM

## 2024-09-26 DIAGNOSIS — L570 Actinic keratosis: Secondary | ICD-10-CM

## 2024-09-26 DIAGNOSIS — L905 Scar conditions and fibrosis of skin: Secondary | ICD-10-CM

## 2024-09-26 DIAGNOSIS — C4492 Squamous cell carcinoma of skin, unspecified: Secondary | ICD-10-CM

## 2024-09-26 MED ORDER — FLUOROURACIL 5 % EX CREA
TOPICAL_CREAM | CUTANEOUS | 3 refills | Status: AC
Start: 2024-09-26 — End: ?

## 2024-09-26 NOTE — Telephone Encounter (Signed)
 Paper Work Dropped Off: Dental PreOp  Date: 09-26-24  Location of paper: Faxed to PreOp team & scanned into pt's chart.

## 2024-09-26 NOTE — Patient Instructions (Signed)

## 2024-09-26 NOTE — Progress Notes (Signed)
 New Patient Visit   History of Present Illness Gregory Gardner is a 63 year old male with a history of skin cancer who presents for follow-up after recent excision of a skin cancer on his temple treated by Dr. Waddell with several excisions. He was referred by a previous dermatologist at [facility] for further management of skin cancer.  Approximately two to two and a half months ago, he underwent a procedure to remove a skin cancer from his temple. The initial excision was performed by Dr. Waddell, but due to dissatisfaction with the care, but he had positive margins. He underwent two additional surgeries after the initial procedure.  He has a history of multiple precancerous lesions on his scalp and face, which have been treated with cryotherapy in the past. He uses Neutrogena SPF 50 sunblock daily to protect his skin.  He has a history of multiple sclerosis and is currently on Mevaclad. He previously used Oviscure, which was costly. He experiences changes in vision and numbness in his leg but remains active and ambulatory.  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 He plans to spend time at the beach and with family over the holidays.  Spot Check: Pt would like for his scalp to be evaluated for skin cancers. He has previously seen Dr. Waddell & Levorn Peck PA for [facility]. Dr. Waddell recommended his scalp be evaluated.   The following portions of the chart were reviewed this encounter and updated as appropriate: medications, allergies, medical history  Review of Systems:  No other skin or systemic complaints except as noted in HPI or Assessment and Plan.  Objective  Well appearing patient in no apparent distress; mood and affect are within normal limits.  A focused examination was performed of the following areas: scalp and face.  Relevant exam findings are noted in the Assessment and Plan.    Assessment & Plan   ACTINIC  KERATOSIS Exam: Erythematous thin papules/macules with gritty scale at the face and scalp  Actinic keratoses are precancerous spots that appear secondary to cumulative UV radiation exposure/sun exposure over time. They are chronic with expected duration over 1 year. A portion of actinic keratoses will progress to squamous cell carcinoma of the skin. It is not possible to reliably predict which spots will progress to skin cancer and so treatment is recommended to prevent development of skin cancer.  Recommend daily broad spectrum sunscreen SPF 30+ to sun-exposed areas, reapply every 2 hours as needed.  Recommend staying in the shade or wearing long sleeves, sun glasses (UVA+UVB protection) and wide brim hats (4-inch brim around the entire circumference of the hat). Call for new or changing lesions.  Treatment Plan: Start 5-fluorouracil  cream twice a day for 21 days to affected areas including face and scalp.  Reviewed course of treatment and expected reaction.  Patient advised to expect inflammation and crusting and advised that erosions are possible.  Patient advised to be diligent with sun protection during and after treatment. Handout with details of how to apply medication and what to expect provided. Counseled to keep medication out of reach of children and pets.  Reviewed course of treatment and expected reaction.  Patient advised to expect inflammation and crusting and advised that erosions are possible.  Patient advised to be diligent with sun protection during and after treatment. Handout with details of how to apply medication and what to expect provided. Counseled to keep medication out of reach of  children and pets.   Patient will treat in states. Apply first to the scalp and forehead. The second stage will be to the face and ears.   Scar s/p excision with Dr. Waddell for [facility] on the left temple - OK to stop bandaging, would has healed  Return in about 3 months (around 12/27/2024) for  efudex  follow up and FBSE.  We spent 45 min reviewing records, taking the patient history, providing face to face care with the patient, sending prescriptions.  Documentation: I have reviewed the above documentation for accuracy and completeness, and I agree with the above.  I, Shirron Maranda, CMA II, am acting as scribe for:  IRollene Gobble, RN, am acting as scribe for RUFUS CHRISTELLA HOLY, MD .   RUFUS CHRISTELLA HOLY, MD

## 2024-09-26 NOTE — Telephone Encounter (Signed)
 Will confirm with Harlene is this request if it is the same dental request we received yesterday. There is a request already in the chart for the pt.

## 2024-09-26 NOTE — Telephone Encounter (Signed)
   Patient Name: Gregory Gardner  DOB: 02/22/1961 MRN: [id number]  Primary Cardiologist: Maude Emmer, MD  Chart reviewed as part of pre-operative protocol coverage.   Dental extractions (i.e. 1-2 teeth) are considered low risk procedures per guidelines and generally do not require any specific cardiac clearance. It is also generally accepted that for 1-2 extractions and dental cleanings, there is no need to interrupt blood thinner therapy.  SBE prophylaxis is not required for the patient from a cardiac standpoint.  I will route this recommendation to the requesting party via Epic fax function and remove from pre-op pool.  Please call with questions.  Jon Garre Mairyn Lenahan, PA 09/26/2024, 8:17 AM

## 2024-09-26 NOTE — Telephone Encounter (Signed)
 Hi Harlene it is a duplicate. Dental office sent it yesterday.

## 2024-10-15 ENCOUNTER — Other Ambulatory Visit: Payer: Self-pay | Admitting: Neurology

## 2024-10-18 ENCOUNTER — Ambulatory Visit: Admitting: Neurology

## 2024-10-18 VITALS — BP 110/64 | HR 43 | Temp 98.2°F | Ht 71.0 in | Wt 154.5 lb

## 2024-10-18 DIAGNOSIS — R208 Other disturbances of skin sensation: Secondary | ICD-10-CM

## 2024-10-18 DIAGNOSIS — Z79899 Other long term (current) drug therapy: Secondary | ICD-10-CM | POA: Diagnosis not present

## 2024-10-18 DIAGNOSIS — G35A Relapsing-remitting multiple sclerosis: Secondary | ICD-10-CM

## 2024-10-18 DIAGNOSIS — M5416 Radiculopathy, lumbar region: Secondary | ICD-10-CM

## 2024-10-18 DIAGNOSIS — R269 Unspecified abnormalities of gait and mobility: Secondary | ICD-10-CM

## 2024-10-18 DIAGNOSIS — R200 Anesthesia of skin: Secondary | ICD-10-CM | POA: Diagnosis not present

## 2024-10-18 MED ORDER — LAMOTRIGINE 100 MG PO TABS: ORAL_TABLET | ORAL | 11 refills | Status: AC

## 2024-10-18 NOTE — Progress Notes (Signed)
 [facility]  PATIENT: Gregory Gardner DOB: Sep 02, 1961  REFERRING DOCTOR OR PCP:   Arnulfo Sharps, MD SOURCE: Patient, notes from [facility], imaging and lab reports, MRI images personally reviewed.  _________________________________   HISTORICAL  CHIEF COMPLAINT:  Chief Complaint  Patient presents with   Follow-up    HISTORY OF PRESENT ILLNESS:   Gregory Gardner is a 64 y.o. man with multiple sclerosis  UPDATE 10/18/2024 He did the two weeks of Mavenclad December 2023 and January 2024 and year 2 March/April 2025. Bith5 day courses were tolerated well with no s.e..  He denies any exacerbations.  He is using a cream for his scalp due to a pre-melanoma.  He has had surgery and cryotherapy in the past and uses SPF 50.  He also had a squamous cell removed on the right scalp.    He sees dermatologist who has diagnosed actinic keratosis and started 5-FU cream.   His gait is stable and he can walk a couple miles.   He does not need the rail to go downstairs..   His left leg is a little weaker and he notes it often feels numb.  It sometimes gives out on him.    He also reports LBP.   He has mild spasticity at times.  His arms are strong but he has right arm/elbow numbness that seems positional.  .    He has newer numbness under the left shoulder blade and in he right arm into the 4th and fifth fingers.  The right arm numbness can sometimes be painful and bothers him at night at times.  He has urinary urgency and very rare incontinence, sometimes at night.    Sometime she has hesitancy and incompletely empties.     He has reduced vision, OS worse than OD  Changes have seemed gradual.   He denies vertigo.  He has some fatigue but tries to stay active.    He sleeps ok many nights but he has some dysesthesias and pain.      He denies mood or cognitive issues.  He has CAD and has had MI x 2.  He has had CABG twice.   He has HTN and elevated cholesterol and mild Type 2  NIDDM (not on any medication).  He also has had elevated CRT in 2023 but not 2022. , He denies heart failure.       MS History: He ws diagnosed with MS in 1999 after presenting with gait disturbance.   He had imaging studies c/w MS but reports he did not see a neurologist.unitl a few years later (Dr. Applegate).  He was initially on Betaseron.  He felt poorly on it and also had an exacerbation with poor balance.  Therefore, he switched to Tecfidera around 2012/2013.   He started seeing Dr. Leopoldo after Dr. Larinda retired.   Due to some progression, he was switched to Ocrevus and 2020 - last infusion was late 2022, reportedly due to insurance issues.   He feels he has not had a clinical exacerbation x years but has had some progression of gait and vision issues.   He had year 1 Mavenclad Nov/Dec 2024  Imaging: MRI of the brain 07/11/2023 showed Scattered T2/FLAIR hyperintense foci in the periventricular and deep white matter of the cerebral hemispheres. None of the foci enhance or appear to be acute. Compared to the MRI from 11/11/2016, there were no new lesions. There are nonspecific, these are consistent with chronic demyelinating plaques associated  with multiple sclerosis.   MRI cervical spine 07/11/2023 shwed T2 hyperintense foci within the spinal cord adjacent to C2-C3 and C5.  These were also noted on the previous MRI from 2018.  They do not enhance.    Minimal disc degenerative changes at C4-C5 and C5-C6 do not lead to spinal stenosis or nerve root compression.  MRI of the head 11/11/2016.  It shows multiple T2/FLAIR hyperintense foci in the periventricular white matter with some foci in the deep white matter.  None of them enhanced.  MRI of the cervical spine 11/11/2016 showed hyperintense foci posteriorly at C2-C3, anteriorly at C5, laterally to the left at C6.  MRI of the thoracic spine showed some foci in the mid thoracic spine.  None of the foci enhanced after contrast  MRI lumbar 2021 reports  show Small central focal disc protrusion at L5-S1 may impinge on the traversing S1 nerve roots.  Minimal multifactorial spinal and foraminal stenosis at L3-4 and L4-5     LABS: He was JCV Ab positive and anti-NMO negative and Hep B core IgG/IgM negative 08/03/2017  REVIEW OF SYSTEMS: Constitutional: No fevers, chills, sweats, or change in appetite Eyes: No visual changes, double vision, eye pain Ear, nose and throat: No hearing loss, ear pain, nasal congestion, sore throat Cardiovascular: No chest pain, palpitations.  He has CAD/MI and history of CABG Respiratory:  No shortness of breath at rest or with exertion.   No wheezes GastrointestinaI: No nausea, vomiting, diarrhea, abdominal pain, fecal incontinence Genitourinary:  No dysuria, urinary retention or frequency.  No nocturia. Musculoskeletal: He reports back pain and sometimes leg pain. Integumentary: No rash, pruritus, skin lesions Neurological: as above Psychiatric: No depression at this time.  No anxiety Endocrine: No palpitations, diaphoresis, change in appetite, change in weigh or increased thirst Hematologic/Lymphatic:  No anemia, purpura, petechiae. Allergic/Immunologic: No itchy/runny eyes, nasal congestion, recent allergic reactions, rashes  ALLERGIES: Allergies  Allergen Reactions   Lodine [Etodolac] Nausea Only    HOME MEDICATIONS:  Current Outpatient Medications:    albuterol  (VENTOLIN  HFA) 108 (90 Base) MCG/ACT inhaler, Inhale 1 puff into the lungs daily as needed (for wheezing)., Disp: , Rfl:    alendronate (FOSAMAX) 70 MG tablet, Take 70 mg by mouth once a week. Take with a full glass of water on an empty stomach., Disp: , Rfl:    Aspirin -Salicylamide-Caffeine (BC HEADACHE POWDER PO), Take 1 packet by mouth daily as needed (pain)., Disp: , Rfl:    cholecalciferol (VITAMIN D3) 25 MCG (1000 UNIT) tablet, Take 1,000 Units by mouth daily., Disp: , Rfl:    clopidogrel  (PLAVIX ) 75 MG tablet, Take 1 tablet (75 mg  total) by mouth daily., Disp: 90 tablet, Rfl: 3   ezetimibe  (ZETIA ) 10 MG tablet, Take 10 mg by mouth daily., Disp: , Rfl:    fenofibrate  micronized (LOFIBRA) 134 MG capsule, Take 134 mg by mouth daily., Disp: , Rfl:    fluorouracil  (EFUDEX ) 5 % cream, Apply 2 times daily for 3 weeks. Apply to forehead and scalp first. Then apply to face and ears., Disp: 40 g, Rfl: 3   losartan  (COZAAR ) 25 MG tablet, TAKE 1 TABLET BY MOUTH DAILY, Disp: 90 tablet, Rfl: 3   metoprolol  succinate (TOPROL -XL) 25 MG 24 hr tablet, Take 25 mg by mouth daily., Disp: , Rfl:    nitroGLYCERIN  (NITROSTAT ) 0.4 MG SL tablet, Place 1 tablet (0.4 mg total) under the tongue every 5 (five) minutes as needed for chest pain., Disp: 25 tablet, Rfl: 3  pantoprazole  (PROTONIX ) 40 MG tablet, Take 1 tablet (40 mg total) by mouth daily., Disp: 30 tablet, Rfl: 2   rosuvastatin  (CRESTOR ) 10 MG tablet, Take 1 tablet (10 mg total) by mouth daily., Disp: 90 tablet, Rfl: 3   lamoTRIgine  (LAMICTAL ) 100 MG tablet, One po bid, Disp: 60 tablet, Rfl: 11   pregabalin  (LYRICA ) 150 MG capsule, TAKE 1 CAPSULE BY MOUTH AT BEDTIME., Disp: 30 capsule, Rfl: 1  PAST MEDICAL HISTORY: Past Medical History:  Diagnosis Date   Abnormal CBC    Actinic keratoses    Asthma    Bladder neoplasm    BMI 21.0-21.9, adult    BPH (benign prostatic hyperplasia)    CAD (coronary artery disease)    s/p Inf STEMI >> S/p CABG in 2003 // Myoview  11/2018: low risk // s/p NSTEMI 8/21 >> s/p redo CABG   Cancer (HCC)    Chronic constipation    Chronic lumbar radiculopathy    Chronic pain of right elbow    CKD (chronic kidney disease)    stage 3   Colon polyp    Colonic obstruction (HCC)    ED (erectile dysfunction)    Erysipelas    Fatigue    GERD (gastroesophageal reflux disease)    Hepatic steatosis    HTN (hypertension)    Hypercholesterolemia    Hypomagnesemia    Impacted cerumen, bilateral    Ischemic cardiomyopathy    Echo 8/21: EF 40-45,  anterior/anterolateral HK, moderate asymmetric LVH, GR 1 DD, normal RVSF, RVSP 23.2   Lipids abnormal    Low back pain    Lumbar spinal stenosis    Lymph node enlargement    Mixed hyperlipidemia    Intol of statins (?elevated LFTs)   MRSA (methicillin resistant Staphylococcus aureus)    Multiple sclerosis    Multiple sclerosis    Myocardial infarction (HCC)    Neuropathy    Nonalcoholic fatty liver disease    Osteoporosis    Seasonal allergies    Seborrheic dermatitis of scalp    Skin lesion    SOB (shortness of breath)    Tinea corporis    Tinea cruris    Urinary bladder neoplasm    Vitamin D deficiency    Weight loss     PAST SURGICAL HISTORY: Past Surgical History:  Procedure Laterality Date   APPLICATION, SKIN SUBSTITUTE Left 06/18/2024   Procedure: APPLICATION, SKIN SUBSTITUTE;  Surgeon: Waddell Leonce NOVAK, MD;  Location: [facility] OR;  Service: Plastics;  Laterality: Left;  possible myriad placement   COMPLEX WOUND CLOSURE Left 06/18/2024   Procedure: COMPLEX CLOSURE, WOUND;  Surgeon: Waddell Leonce NOVAK, MD;  Location: [facility] OR;  Service: Plastics;  Laterality: Left;   CORONARY ARTERY BYPASS GRAFT N/A 06/18/2020   Procedure: REDO CORONARY ARTERY BYPASS GRAFTING (CABG), ON PUMP, TIMES THREE, USING RIGHT INTERNAL MAMMARY ARTERY AND ENDOSCOPICALLY HARVESTED LEFT GREATER SAPHENOUS VEIN;  Surgeon: Army Dallas NOVAK, MD;  Location[facility] OR;  Service: Open Heart Surgery;  Laterality: N/A;  SEQENTIAL DIAG TO OM RIMA TO PREVIOUS DISTAL RIGHT VEIN GRAFT   ENDOVEIN HARVEST OF GREATER SAPHENOUS VEIN Left 06/18/2020   Procedure: ENDOVEIN HARVEST OF GREATER SAPHENOUS VEIN;  Surgeon: Army Dallas NOVAK, MD;  Location: [facility] OR;  Service: Open Heart Surgery;  Laterality: Left;   EXCISION MASS HEAD N/A 05/28/2024   Procedure: EXCISION, MASS, HEAD;  Surgeon: Waddell Leonce NOVAK, MD;  Location: [facility] OR;  Service: Plastics;  Laterality: N/A;  Excision of bilateral temple skin lesions   EXCISION  MASS HEAD Left  06/18/2024   Procedure: EXCISION, MASS, HEAD;  Surgeon: Waddell Leonce NOVAK, MD;  Location: [facility] OR;  Service: Plastics;  Laterality: Left;  Reexcision left temple skin lesion, complex closure, possible myriad placement   LEFT HEART CATH AND CORS/GRAFTS ANGIOGRAPHY N/A 06/16/2020   Procedure: LEFT HEART CATH AND CORS/GRAFTS ANGIOGRAPHY;  Surgeon: Claudene Victory ORN, MD;  Location: [facility] INVASIVE CV LAB;  Service: Cardiovascular;  Laterality: N/A;   MANDIBLE SURGERY     TEE WITHOUT CARDIOVERSION N/A 06/18/2020   Procedure: TRANSESOPHAGEAL ECHOCARDIOGRAM (TEE);  Surgeon: Army Dallas NOVAK, MD;  Location: [facility] OR;  Service: Open Heart Surgery;  Laterality: N/A;    FAMILY HISTORY: Family History  Problem Relation Age of Onset   Heart Problems Mother    Heart Problems Father    Heart attack Father    Heart Problems Sister    Heart Problems Brother     SOCIAL HISTORY: Social History   Socioeconomic History   Marital status: Married    Spouse name: Not on file   Number of children: Not on file   Years of education: Not on file   Highest education level: Not on file  Occupational History   Not on file  Tobacco Use   Smoking status: Never   Smokeless tobacco: Never  Substance and Sexual Activity   Alcohol use: Yes    Alcohol/week: 21.0 standard drinks of alcohol    Types: 21 Cans of beer per week    Comment: Beer- daily   Drug use: No   Sexual activity: Not on file  Other Topics Concern   Not on file  Social History Narrative   Right handed   Caffeine use: Diet coke daily   Lives with dogs   Social Drivers of Health   Tobacco Use: Low Risk (10/18/2024)   Patient History    Smoking Tobacco Use: Never    Smokeless Tobacco Use: Never    Passive Exposure: Not on file  Financial Resource Strain: Not on file  Food Insecurity: Not on file  Transportation Needs: Not on file  Physical Activity: Not on file  Stress: Not on file  Social Connections: Unknown (03/14/2022)   [facility]   Social Network    Social Network: Not on file  Intimate Partner Violence: Unknown (02/04/2022)   Received from [facility]   HITS    Physically Hurt: Not on file    Insult or Talk Down To: Not on file    Threaten Physical Harm: Not on file    Scream or Curse: Not on file  Depression (PHQ2-9): Not on file  Alcohol Screen: Not on file  Housing: Not on file  Utilities: Not on file  Health Literacy: Not on file       PHYSICAL EXAM  Vitals:   10/18/24 1034  BP: 110/64  Pulse: (!) 43  Temp: 98.2 F (36.8 C)  TempSrc: Oral  SpO2: 97%  Weight: 154 lb 8 oz (70.1 kg)  Height: 5' 11 (1.803 m)    Body mass index is 21.55 kg/m.  No results found.    General: The patient is well-developed and well-nourished and in no acute distress  HEENT:  Sclera are anicteric.    Skin: Extremities are without rash or edema.   Skin changes on scalp.    Neurologic Exam  Mental status: The patient is alert and oriented x 3 at the time of the examination. The patient has apparent normal recent and remote  memory, with an apparently normal attention span and concentration ability.   Speech is normal.  Cranial nerves: Extraocular movements are full.  Mild reduced acuity OS but colors are symmetric.  Facial strnegth and sensation are normal.  . No obvious hearing deficits are noted.  Motor:  Muscle bulk is normal.   Tone is normal. Strength is  5 / 5 in the arms except for 4+/5 strength in the ulnar innervated hand muscles.  Legs were 5/5 except left  4+/5 iliopsoas and TA/EHL and 5-/5 left quads .   Sensory: Sensory testing is intact to pinprick, soft touch and vibration sensation in the arms and reduced vibration sensation in the left leg.  Intact touch sensation in the legs.  He has Tinel signs at the right elbow.  No Tinel signs at the wrist.  Coordination: Cerebellar testing reveals good finger-nose-finger and heel-to-shin bilaterally.  Gait and station: Station is normal.    There is a left foot drop.  Tandem gait is wide.. Romberg is negative.   Reflexes: Deep tendon reflexes are symmetric and normal in the arms and increased at the knees, 3+ with crossed abductors, L>R.  Ankles are 2 ., L>R, with no clonus.        DIAGNOSTIC DATA (LABS, IMAGING, TESTING) - I reviewed patient records, labs, notes, testing and imaging myself where available.  Lab Results  Component Value Date   WBC 5.6 05/28/2024   HGB 13.8 05/28/2024   HCT 40.6 05/28/2024   MCV 95.3 05/28/2024   PLT 144 (L) 05/28/2024      Component Value Date/Time   NA 141 05/28/2024 1423   NA 140 09/05/2023 1358   K 4.3 05/28/2024 1423   CL 108 05/28/2024 1423   CO2 23 05/28/2024 1423   GLUCOSE 89 05/28/2024 1423   BUN 13 05/28/2024 1423   BUN 19 09/05/2023 1358   CREATININE 0.85 05/28/2024 1423   CALCIUM  9.5 05/28/2024 1423   PROT 7.1 05/28/2024 1423   PROT 6.8 03/20/2024 1546   ALBUMIN  4.5 05/28/2024 1423   ALBUMIN  4.7 03/20/2024 1546   AST 34 05/28/2024 1423   ALT 29 05/28/2024 1423   ALKPHOS 36 (L) 05/28/2024 1423   BILITOT 1.3 (H) 05/28/2024 1423   BILITOT 0.6 03/20/2024 1546   GFRNONAA >60 05/28/2024 1423   GFRAA >60 06/23/2020 0219   Lab Results  Component Value Date   CHOL 201 (H) 03/01/2024   HDL 53 03/01/2024   LDLCALC 133 (H) 03/01/2024   TRIG 84 03/01/2024   CHOLHDL 3.8 03/01/2024   Lab Results  Component Value Date   HGBA1C 6.5 (H) 06/18/2020       ASSESSMENT AND PLAN  Multiple sclerosis, relapsing-remitting - Plan: CBC with Differential/Platelet, Hepatic function panel  Lumbar radiculopathy  Gait disturbance  Numbness  Dysesthesia  High risk medication use - Plan: CBC with Differential/Platelet, Hepatic function panel  He has completed the 2 years of Mavenclad.  Check CBC/differential.   If lymphocytes still under 0.8, will recheck again at next visit.   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  If  symptoms worsen, can check a NCV/EMG and consider referral to orthopedics for surgery.  We discussed trying to avoid pressure near the ulnar groove.   Leg dysesthesias could be due to MS or degenerative changes in the spine.  Increase lamotrigine  to 100 mg po bid.   return in 7-8 months.     Aydin Cavalieri A. Vear, MD,  PhD,FAAN 10/18/2024, 11:06 AM Certified in Neurology, Clinical Neurophysiology, Sleep Medicine and Neuroimaging  [facility] [address], [address] [place], [place] [address] [phone]

## 2024-10-19 ENCOUNTER — Ambulatory Visit: Payer: Self-pay | Admitting: Neurology

## 2024-10-19 LAB — CBC WITH DIFFERENTIAL/PLATELET
Basophils Absolute: 0 x10E3/uL (ref 0.0–0.2)
Basos: 1 %
EOS (ABSOLUTE): 0.6 x10E3/uL — ABNORMAL HIGH (ref 0.0–0.4)
Eos: 12 %
Hematocrit: 41.9 % (ref 37.5–51.0)
Hemoglobin: 13.8 g/dL (ref 13.0–17.7)
Immature Grans (Abs): 0.1 x10E3/uL (ref 0.0–0.1)
Immature Granulocytes: 1 %
Lymphocytes Absolute: 0.6 x10E3/uL — ABNORMAL LOW (ref 0.7–3.1)
Lymphs: 10 %
MCH: 32.5 pg (ref 26.6–33.0)
MCHC: 32.9 g/dL (ref 31.5–35.7)
MCV: 99 fL — ABNORMAL HIGH (ref 79–97)
Monocytes Absolute: 0.4 x10E3/uL (ref 0.1–0.9)
Monocytes: 8 %
Neutrophils Absolute: 3.8 x10E3/uL (ref 1.4–7.0)
Neutrophils: 68 %
Platelets: 141 x10E3/uL — ABNORMAL LOW (ref 150–450)
RBC: 4.25 x10E6/uL (ref 4.14–5.80)
RDW: 12.6 % (ref 11.6–15.4)
WBC: 5.5 x10E3/uL (ref 3.4–10.8)

## 2024-10-19 LAB — HEPATIC FUNCTION PANEL
ALT: 32 IU/L (ref 0–44)
AST: 39 IU/L (ref 0–40)
Albumin: 4.7 g/dL (ref 3.9–4.9)
Alkaline Phosphatase: 53 IU/L (ref 47–123)
Bilirubin Total: 0.6 mg/dL (ref 0.0–1.2)
Bilirubin, Direct: 0.21 mg/dL (ref 0.00–0.40)
Total Protein: 6.7 g/dL (ref 6.0–8.5)

## 2024-11-05 ENCOUNTER — Ambulatory Visit: Admitting: Plastic Surgery

## 2024-11-05 VITALS — BP 132/79 | HR 56

## 2024-11-05 DIAGNOSIS — C44320 Squamous cell carcinoma of skin of unspecified parts of face: Secondary | ICD-10-CM

## 2024-11-05 DIAGNOSIS — Z08 Encounter for follow-up examination after completed treatment for malignant neoplasm: Secondary | ICD-10-CM | POA: Diagnosis not present

## 2024-11-05 DIAGNOSIS — Z85828 Personal history of other malignant neoplasm of skin: Secondary | ICD-10-CM | POA: Diagnosis not present

## 2024-11-05 NOTE — Progress Notes (Signed)
 Mr. 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  He is doing well with no specific complaints.  The incision is essentially healed though there is a small area in the center which continues to heal.  He has seen Dr. Paci and is now being treated with 5-fluorouracil  for his actinic keratoses on his head and face.  He is reminded to wear sunscreen and use moisturizing cream at all times.  Photographs were taken today with his consent.  Follow-up with me as needed.

## 2024-12-24 ENCOUNTER — Ambulatory Visit: Admitting: Dermatology

## 2024-12-25 ENCOUNTER — Ambulatory Visit: Admitting: Dermatology

## 2025-06-12 ENCOUNTER — Ambulatory Visit: Admitting: Neurology
